# Patient Record
Sex: Female | Born: 1985 | Race: White | Marital: Single | Smoking: Never smoker
Health system: Southern US, Community
[De-identification: ages and names within clinical notes are randomized; demographics above are authoritative.]

## PROBLEM LIST (undated history)

## (undated) DIAGNOSIS — E039 Hypothyroidism, unspecified: Secondary | ICD-10-CM

## (undated) DIAGNOSIS — Z7689 Persons encountering health services in other specified circumstances: Secondary | ICD-10-CM

## (undated) DIAGNOSIS — E669 Obesity, unspecified: Secondary | ICD-10-CM

## (undated) DIAGNOSIS — N390 Urinary tract infection, site not specified: Secondary | ICD-10-CM

## (undated) DIAGNOSIS — G43909 Migraine, unspecified, not intractable, without status migrainosus: Secondary | ICD-10-CM

## (undated) HISTORY — DX: Obesity, unspecified: E66.9

## (undated) HISTORY — DX: Persons encountering health services in other specified circumstances: Z76.89

## (undated) HISTORY — DX: Migraine, unspecified, not intractable, without status migrainosus: G43.909

---
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
## 2011-10-22 ENCOUNTER — Other Ambulatory Visit (HOSPITAL_COMMUNITY)
Admission: RE | Admit: 2011-10-22 | Discharge: 2011-10-22 | Disposition: A | Discharge status: Home / Self Care | Payer: Medicaid Other | Source: Ambulatory Visit | Attending: Obstetrics & Gynecology | Admitting: Obstetrics & Gynecology

## 2011-10-22 DIAGNOSIS — Z113 Encounter for screening for infections with a predominantly sexual mode of transmission: Secondary | ICD-10-CM | POA: Insufficient documentation

## 2011-10-22 DIAGNOSIS — Z01419 Encounter for gynecological examination (general) (routine) without abnormal findings: Secondary | ICD-10-CM | POA: Insufficient documentation

## 2012-11-02 ENCOUNTER — Other Ambulatory Visit: Payer: Self-pay | Admitting: Adult Health

## 2012-11-19 ENCOUNTER — Other Ambulatory Visit: Payer: Self-pay | Admitting: Obstetrics & Gynecology

## 2012-11-20 ENCOUNTER — Other Ambulatory Visit: Payer: Self-pay | Admitting: *Deleted

## 2012-11-20 MED ORDER — NORGESTIMATE-ETH ESTRADIOL 0.25-35 MG-MCG PO TABS: 1.0000 | ORAL_TABLET | Freq: Every day | ORAL | Status: DC

## 2012-12-07 ENCOUNTER — Other Ambulatory Visit: Payer: Self-pay | Admitting: Adult Health

## 2012-12-21 ENCOUNTER — Other Ambulatory Visit: Payer: Self-pay | Admitting: Adult Health

## 2012-12-25 ENCOUNTER — Encounter: Payer: Self-pay | Admitting: *Deleted

## 2012-12-28 ENCOUNTER — Other Ambulatory Visit (HOSPITAL_COMMUNITY)
Admission: RE | Admit: 2012-12-28 | Discharge: 2012-12-28 | Disposition: A | Discharge status: Home / Self Care | Payer: Medicaid Other | Source: Ambulatory Visit | Attending: Adult Health | Admitting: Adult Health

## 2012-12-28 ENCOUNTER — Ambulatory Visit (INDEPENDENT_AMBULATORY_CARE_PROVIDER_SITE_OTHER): Payer: Medicaid Other | Admitting: Adult Health

## 2012-12-28 ENCOUNTER — Encounter: Payer: Self-pay | Admitting: Adult Health

## 2012-12-28 VITALS — BP 120/80 | HR 78 | Ht 65.0 in | Wt 217.0 lb

## 2012-12-28 DIAGNOSIS — Z113 Encounter for screening for infections with a predominantly sexual mode of transmission: Secondary | ICD-10-CM | POA: Insufficient documentation

## 2012-12-28 DIAGNOSIS — Z01419 Encounter for gynecological examination (general) (routine) without abnormal findings: Secondary | ICD-10-CM | POA: Insufficient documentation

## 2012-12-28 DIAGNOSIS — Z3049 Encounter for surveillance of other contraceptives: Secondary | ICD-10-CM

## 2012-12-28 LAB — RPR

## 2012-12-28 MED ORDER — NORGESTIMATE-ETH ESTRADIOL 0.25-35 MG-MCG PO TABS: 1.0000 | ORAL_TABLET | Freq: Every day | ORAL | Status: DC

## 2012-12-28 NOTE — Progress Notes (Signed)
Patient ID: Debra Daniel, female   DOB: 1985-09-23, 27 y.o.   MRN: [id number] History of Present Illness:  Debra Daniel is a 27 year old white female in for pap and physical.  Current Medications, Allergies, Past Medical History, Past Surgical History, Family History and Social History were reviewed in [name] record.     Review of Systems: Patient denies any  blurred vision, shortness of breath, chest pain, abdominal pain, problems with bowel movements, urination. Has occasional headache before menses, has never had sex. No mood swings, no joint pain.   Physical Exam:Blood pressure 120/80, pulse 78, height 5\' 5"  (1.651 m), weight 217 lb (98.431 kg), last menstrual period 12/16/2012. General:  Well developed, well nourished, no acute distress Skin:  Warm and dry Neck:  Midline trachea, normal thyroid Lungs; Clear to auscultation bilaterally Breast:  No dominant palpable mass, retraction, or nipple discharge Cardiovascular: Regular rate and rhythm Abdomen:  Soft, non tender, no hepatosplenomegaly Pelvic:  External genitalia is normal in appearance.  The vagina is normal in appearance. The cervix is nullipareous. Pap performed with GC/CHL  Uterus is felt to be normal size, shape, and contour.  No  adnexal masses or tenderness noted. Extremities:  No swelling or varicosities noted Psych:  Alert and cooperative, seems happy   Impression: Family planning medicaid yearly exam Period management  STD screening  Plan: Check HIV, RPR, hept B&C and HSV 2. Physical in 1 year Continue Sprintec

## 2012-12-28 NOTE — Patient Instructions (Addendum)
Sign up for my chart Physical in 1 year Call about labs

## 2012-12-29 LAB — HSV 2 ANTIBODY, IGG: HSV 2 Glycoprotein G Ab, IgG: <0.1 IV

## 2014-01-04 ENCOUNTER — Encounter: Payer: Self-pay | Admitting: Adult Health

## 2014-01-04 ENCOUNTER — Other Ambulatory Visit (HOSPITAL_COMMUNITY)
Admission: RE | Admit: 2014-01-04 | Discharge: 2014-01-04 | Disposition: A | Discharge status: Home / Self Care | Payer: Medicaid Other | Source: Ambulatory Visit | Attending: Adult Health | Admitting: Adult Health

## 2014-01-04 ENCOUNTER — Ambulatory Visit (INDEPENDENT_AMBULATORY_CARE_PROVIDER_SITE_OTHER): Payer: Medicaid Other | Admitting: Adult Health

## 2014-01-04 VITALS — BP 122/74 | HR 78 | Ht 64.0 in | Wt 219.0 lb

## 2014-01-04 DIAGNOSIS — Z7689 Persons encountering health services in other specified circumstances: Secondary | ICD-10-CM

## 2014-01-04 DIAGNOSIS — Z113 Encounter for screening for infections with a predominantly sexual mode of transmission: Secondary | ICD-10-CM | POA: Insufficient documentation

## 2014-01-04 DIAGNOSIS — Z01419 Encounter for gynecological examination (general) (routine) without abnormal findings: Secondary | ICD-10-CM | POA: Insufficient documentation

## 2014-01-04 DIAGNOSIS — Z3049 Encounter for surveillance of other contraceptives: Secondary | ICD-10-CM

## 2014-01-04 MED ORDER — LEVONORGEST-ETH ESTRAD 91-DAY 0.15-0.03 &0.01 MG PO TABS: 1.0000 | ORAL_TABLET | Freq: Every day | ORAL | Status: DC

## 2014-01-04 NOTE — Progress Notes (Signed)
Patient ID: Debra Daniel, female   DOB: Dec 15, 1985, 28 y.o.   MRN: [id number] History of Present Illness: Debra Daniel is a 28 year old white female in for a pap and physical.  Current Medications, Allergies, Past Medical History, Past Surgical History, Family History and Social History were reviewed in [employer] [facility] medical record.     Review of Systems: Patient denies any blurred vision, shortness of breath, chest pain, abdominal pain, problems with bowel movements, urination.She is not sexually active, no joint pain or mood swings, she has had some migraines associated with her periods and would like too change OCs to seasonique.    Physical Exam:BP 122/74  Pulse 78  Ht 5\' 4"  (1.626 m)  Wt 219 lb (99.338 kg)  BMI 37.57 kg/m2  LMP 12/19/2013 General:  Well developed, well nourished, no acute distress Skin:  Warm and dry Neck:  Midline trachea, normal thyroid Lungs; Clear to auscultation bilaterally Breast:  No dominant palpable mass, retraction, or nipple discharge Cardiovascular: Regular rate and rhythm Abdomen:  Soft, non tender, no hepatosplenomegaly Pelvic:  External genitalia is normal in appearance.  The vagina is normal in appearance.   The cervix is smooth, pap with GC/CHL performed.  Uterus is felt to be normal size, shape, and contour.  No                adnexal masses or tenderness noted. Extremities:  No swelling or varicosities noted, has bruise left knee fell in kitchen. Psych:  No mood changes, alert and cooperative, seems happy   Impression: Yearly gyn exam family planning medicaid Period management STD screening    Plan: Rx seasonique 1 daily with 4 refills, finish current pill pack then start these Follow up in 1 year for pap and physical Check HIV,RPR,HSV 2

## 2014-01-04 NOTE — Patient Instructions (Signed)
Finish current pills then start seasonique Return in 1 year for pap

## 2014-01-05 LAB — HSV 2 ANTIBODY, IGG: HSV 2 Glycoprotein G Ab, IgG: <0.1 IV

## 2014-01-05 LAB — SYPHILIS: RPR W/REFLEX TO RPR TITER AND TREPONEMAL ANTIBODIES, TRADITIONAL SCREENING AND DIAGNOSIS ALGORITHM

## 2014-01-05 LAB — HIV ANTIBODY (ROUTINE TESTING W REFLEX): HIV 1&2 Ab, 4th Generation: NONREACTIVE

## 2014-12-07 ENCOUNTER — Encounter: Payer: Self-pay | Admitting: Adult Health

## 2015-01-16 ENCOUNTER — Other Ambulatory Visit: Payer: Medicaid Other | Admitting: Adult Health

## 2015-01-24 ENCOUNTER — Encounter: Payer: Self-pay | Admitting: Adult Health

## 2015-01-24 ENCOUNTER — Ambulatory Visit (INDEPENDENT_AMBULATORY_CARE_PROVIDER_SITE_OTHER): Payer: Medicaid Other | Admitting: Adult Health

## 2015-01-24 ENCOUNTER — Other Ambulatory Visit (HOSPITAL_COMMUNITY)
Admission: RE | Admit: 2015-01-24 | Discharge: 2015-01-24 | Disposition: A | Discharge status: Home / Self Care | Payer: Medicaid Other | Source: Ambulatory Visit | Attending: Adult Health | Admitting: Adult Health

## 2015-01-24 VITALS — BP 100/80 | HR 88 | Ht 64.5 in | Wt 215.0 lb

## 2015-01-24 DIAGNOSIS — Z308 Encounter for other contraceptive management: Secondary | ICD-10-CM

## 2015-01-24 DIAGNOSIS — Z01419 Encounter for gynecological examination (general) (routine) without abnormal findings: Secondary | ICD-10-CM | POA: Insufficient documentation

## 2015-01-24 DIAGNOSIS — Z113 Encounter for screening for infections with a predominantly sexual mode of transmission: Secondary | ICD-10-CM | POA: Diagnosis present

## 2015-01-24 DIAGNOSIS — Z7689 Persons encountering health services in other specified circumstances: Secondary | ICD-10-CM

## 2015-01-24 DIAGNOSIS — Z309 Encounter for contraceptive management, unspecified: Secondary | ICD-10-CM

## 2015-01-24 HISTORY — DX: Persons encountering health services in other specified circumstances: Z76.89

## 2015-01-24 MED ORDER — LEVONORGEST-ETH ESTRAD 91-DAY 0.15-0.03 &0.01 MG PO TABS: 1.0000 | ORAL_TABLET | Freq: Every day | ORAL | Status: DC

## 2015-01-24 NOTE — Patient Instructions (Signed)
Physical in 1 year Continue OCs  

## 2015-01-24 NOTE — Progress Notes (Signed)
Patient ID: Timberlyn Shippee, female   DOB: [birth year], 29 y.o.   MRN: [id number] History of Present Illness:  Ashonta is a 29 year old white female, single in for well woman gyn exam, she has family planning medicaid.  Current Medications, Allergies, Past Medical History, Past Surgical History, Family History and Social History were reviewed in [name] record.     Review of Systems: Patient denies any headaches, hearing loss, fatigue, blurred vision, shortness of breath, chest pain, abdominal pain, problems with bowel movements, urination, or intercourse(has not had sex). No joint pain or mood swings.Had bee sting right breast with rash and itching is better now. She is happy with her pills and period.   Physical Exam:BP 100/80 mmHg  Pulse 88  Ht 5' 4.5" (1.638 m)  Wt 215 lb (97.523 kg)  BMI 36.35 kg/m2  LMP 01/10/2015 General:  Well developed, well nourished, no acute distress Skin:  Warm and dry Neck:  Midline trachea, normal thyroid, good ROM, no lymphadenopathy Lungs; Clear to auscultation bilaterally Breast:  No dominant palpable mass, retraction, or nipple discharge, has scab right breast where bee sting was. Cardiovascular: Regular rate and rhythm Abdomen:  Soft, non tender, no hepatosplenomegaly Pelvic:  External genitalia is normal in appearance, no lesions.  The vagina is normal in appearance. Urethra has no lesions or masses. The cervix is tiny and smooth, pap with GC.CHL performed.  Uterus is felt to be normal size, shape, and contour.  No adnexal masses or tenderness noted.Bladder is non tender, no masses felt. Extremities/musculoskeletal:  No swelling or varicosities noted, no clubbing or cyanosis Psych:  No mood changes, alert and cooperative,seems happy   Impression: Well woman gyn exam with pap and GC/CHL Periods management    Plan: Check HIV,RPR and HSV 2 Physical in 1 year Refilled camrese x 1 year

## 2015-01-25 LAB — RPR: RPR: NONREACTIVE

## 2015-01-25 LAB — HIV ANTIBODY (ROUTINE TESTING W REFLEX): HIV Screen 4th Generation wRfx: NONREACTIVE

## 2015-01-25 LAB — HSV 2 ANTIBODY, IGG

## 2015-01-26 LAB — CYTOLOGY - PAP

## 2016-04-02 ENCOUNTER — Encounter: Payer: Self-pay | Admitting: Adult Health

## 2016-04-02 ENCOUNTER — Ambulatory Visit (INDEPENDENT_AMBULATORY_CARE_PROVIDER_SITE_OTHER): Payer: Medicaid Other | Admitting: Adult Health

## 2016-04-02 VITALS — BP 130/74 | HR 82 | Ht 64.5 in | Wt 225.0 lb

## 2016-04-02 DIAGNOSIS — Z308 Encounter for other contraceptive management: Secondary | ICD-10-CM | POA: Diagnosis not present

## 2016-04-02 DIAGNOSIS — Z7689 Persons encountering health services in other specified circumstances: Secondary | ICD-10-CM

## 2016-04-02 DIAGNOSIS — Z3009 Encounter for other general counseling and advice on contraception: Secondary | ICD-10-CM

## 2016-04-02 DIAGNOSIS — Z01419 Encounter for gynecological examination (general) (routine) without abnormal findings: Secondary | ICD-10-CM

## 2016-04-02 MED ORDER — LEVONORGEST-ETH ESTRAD 91-DAY 0.15-0.03 &0.01 MG PO TABS: 1.0000 | ORAL_TABLET | Freq: Every day | ORAL | 4 refills | Status: DC

## 2016-04-02 NOTE — Progress Notes (Signed)
Patient ID: Debra Daniel, female   DOB: [birth year], 30 y.o.   MRN: [id number] History of Present Illness:  Debra Daniel is a 30 year old white female, single in for well woman gyn exam,had normal pap 01/24/15, she has family planning medicaid.  Current Medications, Allergies, Past Medical History, Past Surgical History, Family History and Social History were reviewed in [name] record.     Review of Systems: Patient denies any headaches, hearing loss, fatigue, blurred vision, shortness of breath, chest pain, abdominal pain, problems with bowel movements, urination, or intercourse(not having sex). No joint pain or mood swings.Periods good on the pills.    Physical Exam: General:  Well developed, well nourished, no acute distress Skin:  Warm and dry Neck:  Midline trachea, normal thyroid, good ROM, no lymphadenopathy Lungs; Clear to auscultation bilaterally Breast:  No dominant palpable mass, retraction, or nipple discharge Cardiovascular: Regular rate and rhythm Abdomen:  Soft, non tender, no hepatosplenomegaly Pelvic:  External genitalia is normal in appearance, no lesions.  The vagina is normal in appearance. Urethra has no lesions or masses. The cervix is nullipareous.  Uterus is felt to be normal size, shape, and contour.  No adnexal masses or tenderness noted.Bladder is non tender, no masses felt. GC/CHL obtained. Extremities/musculoskeletal:  No swelling or varicosities noted, no clubbing or cyanosis Psych:  No mood changes, alert and cooperative,seems happy   Impression: Well woman exam with routine gynecological exam  Encounter for menstrual regulation  Family planning - Plan: GC/Chlamydia Probe Amp, HIV antibody, HSV 2 antibody, IgG, RPR    Plan: Check HIV,RPR and HSV 2 GC/CHL sent Refilled camrese disp 1 package, take 1 daily  with 4 refills Physical in 1 year, pap 2019

## 2016-04-02 NOTE — Patient Instructions (Signed)
Physical in 1 year, pap 2019 Continue OCs

## 2016-04-03 LAB — HIV ANTIBODY (ROUTINE TESTING W REFLEX): HIV Screen 4th Generation wRfx: NONREACTIVE

## 2016-04-03 LAB — HSV 2 ANTIBODY, IGG: HSV 2 Glycoprotein G Ab, IgG: <0.91 index (ref 0.00–0.90)

## 2016-04-03 LAB — RPR: RPR Ser Ql: NONREACTIVE

## 2016-04-04 LAB — GC/CHLAMYDIA PROBE AMP
Chlamydia trachomatis, NAA: NEGATIVE
Neisseria gonorrhoeae by PCR: NEGATIVE

## 2017-04-04 ENCOUNTER — Other Ambulatory Visit (HOSPITAL_COMMUNITY)
Admission: RE | Admit: 2017-04-04 | Discharge: 2017-04-04 | Disposition: A | Discharge status: Home / Self Care | Payer: Medicaid Other | Source: Ambulatory Visit | Attending: Adult Health | Admitting: Adult Health

## 2017-04-04 ENCOUNTER — Encounter: Payer: Self-pay | Admitting: Adult Health

## 2017-04-04 ENCOUNTER — Ambulatory Visit (INDEPENDENT_AMBULATORY_CARE_PROVIDER_SITE_OTHER): Payer: Medicaid Other | Admitting: Adult Health

## 2017-04-04 VITALS — BP 120/80 | HR 80 | Ht 64.5 in | Wt 217.0 lb

## 2017-04-04 DIAGNOSIS — Z7689 Persons encountering health services in other specified circumstances: Secondary | ICD-10-CM

## 2017-04-04 DIAGNOSIS — Z3009 Encounter for other general counseling and advice on contraception: Secondary | ICD-10-CM | POA: Diagnosis not present

## 2017-04-04 DIAGNOSIS — Z309 Encounter for contraceptive management, unspecified: Secondary | ICD-10-CM

## 2017-04-04 DIAGNOSIS — Z01419 Encounter for gynecological examination (general) (routine) without abnormal findings: Secondary | ICD-10-CM

## 2017-04-04 MED ORDER — LEVONORGEST-ETH ESTRAD 91-DAY 0.15-0.03 &0.01 MG PO TABS: 1.0000 | ORAL_TABLET | Freq: Every day | ORAL | 4 refills | Status: DC

## 2017-04-04 NOTE — Progress Notes (Signed)
Patient ID: Debra Daniel, female   DOB: 1986/02/10, 31 y.o.   MRN: [id number] History of Present Illness:  Debra Daniel is a 31 year old white female in for well woman gyn exam and pap, she has family planning medicaid.   Current Medications, Allergies, Past Medical History, Past Surgical History, Family History and Social History were reviewed in [name] record.     Review of Systems: Patient denies any headaches, hearing loss, fatigue, blurred vision, shortness of breath, chest pain, abdominal pain, problems with bowel movements, urination, or intercourse(never had sex) . No joint pain or mood swings. She is happy with her periods on OCs.    Physical Exam:BP 120/80 (BP Location: Left Arm, Patient Position: Sitting, Cuff Size: Large)   Pulse 80   Ht 5' 4.5" (1.638 m)   Wt 217 lb (98.4 kg)   LMP 01/10/2017 (Approximate) Comment: has a period every 3 months  BMI 36.67 kg/m  General:  Well developed, well nourished, no acute distress Skin:  Warm and dry Neck:  Midline trachea, normal thyroid, good ROM, no lymphadenopathy Lungs; Clear to auscultation bilaterally Breast:  No dominant palpable mass, retraction, or nipple discharge Cardiovascular: Regular rate and rhythm Abdomen:  Soft, non tender, no hepatosplenomegaly Pelvic:  External genitalia is normal in appearance, no lesions.  The vagina is normal in appearance, spotting some.Marland Kitchen Urethra has no lesions or masses. The cervix is smooth, poorly seen, pap with GC/CHL performed.  Uterus is felt to be normal size, shape, and contour.  No adnexal masses or tenderness noted.Bladder is non tender, no masses felt. Extremities/musculoskeletal:  No swelling or varicosities noted, no clubbing or cyanosis Psych:  No mood changes, alert and cooperative,seems happy PHQ 2 score 0.  Impression: 1. Encounter for routine gynecological examination with Papanicolaou smear of cervix   2. Family planning   3. Encounter for menstrual  regulation       Plan: Meds ordered this encounter  Medications  . Levonorgestrel-Ethinyl Estradiol (AMETHIA,CAMRESE) 0.15-0.03 &0.01 MG tablet    Sig: Take 1 tablet by mouth daily.    Dispense:  1 Package    Refill:  4    Order Specific Question:   Supervising Provider    Answer:   Lazaro Arms [[id number]]  Physical in 1 year, pap in 2-3 if normal  Check HIV and RPR

## 2017-04-04 NOTE — Patient Instructions (Signed)
Physical in  1 year Pap in 2-3 if normal

## 2017-04-05 LAB — HIV ANTIBODY (ROUTINE TESTING W REFLEX): HIV Screen 4th Generation wRfx: NONREACTIVE

## 2017-04-05 LAB — RPR: RPR Ser Ql: NONREACTIVE

## 2017-04-09 LAB — CYTOLOGY - PAP
Chlamydia: NEGATIVE
Diagnosis: NEGATIVE
HPV: NOT DETECTED
NEISSERIA GONORRHEA: NEGATIVE

## 2018-04-08 ENCOUNTER — Other Ambulatory Visit: Payer: Self-pay | Admitting: Adult Health

## 2018-04-09 ENCOUNTER — Ambulatory Visit (INDEPENDENT_AMBULATORY_CARE_PROVIDER_SITE_OTHER): Payer: Medicaid Other | Admitting: Adult Health

## 2018-04-09 ENCOUNTER — Encounter: Payer: Self-pay | Admitting: Adult Health

## 2018-04-09 VITALS — BP 114/83 | HR 86 | Ht 64.5 in | Wt 226.0 lb

## 2018-04-09 DIAGNOSIS — Z7689 Persons encountering health services in other specified circumstances: Secondary | ICD-10-CM

## 2018-04-09 DIAGNOSIS — Z309 Encounter for contraceptive management, unspecified: Secondary | ICD-10-CM

## 2018-04-09 DIAGNOSIS — Z01419 Encounter for gynecological examination (general) (routine) without abnormal findings: Secondary | ICD-10-CM

## 2018-04-09 DIAGNOSIS — Z113 Encounter for screening for infections with a predominantly sexual mode of transmission: Secondary | ICD-10-CM

## 2018-04-09 DIAGNOSIS — Z3009 Encounter for other general counseling and advice on contraception: Secondary | ICD-10-CM

## 2018-04-09 MED ORDER — LEVONORGEST-ETH ESTRAD 91-DAY 0.15-0.03 &0.01 MG PO TABS: 1.0000 | ORAL_TABLET | Freq: Every day | ORAL | 4 refills | Status: DC

## 2018-04-09 NOTE — Progress Notes (Signed)
Patient ID: Debra Daniel, female   DOB: 1986-08-08, 32 y.o.   MRN: [id number] History of Present Illness:  Debra Daniel is a 32 year old white female in for well woman gyn exam and get refill on OCs, to manage periods.Last pap was 04/04/17 an was negative for malignancy and HPV. No PCP.  Current Medications, Allergies, Past Medical History, Past Surgical History, Family History and Social History were reviewed in [name] record.     Review of Systems:  Patient denies any headaches, hearing loss, fatigue, blurred vision, shortness of breath, chest pain, abdominal pain, problems with bowel movements, urination, or intercourse(never had sex) No joint pain or mood swings. Periods good in OCs.   Physical Exam:BP 114/83 (BP Location: Left Arm, Patient Position: Sitting, Cuff Size: Normal)   Pulse 86   Ht 5' 4.5" (1.638 m)   Wt 226 lb (102.5 kg)   LMP 02/07/2018   BMI 38.19 kg/m  General:  Well developed, well nourished, no acute distress Skin:  Warm and dry Neck:  Midline trachea, normal thyroid, good ROM, no lymphadenopathy Lungs; Clear to auscultation bilaterally Breast:  No dominant palpable mass, retraction, or nipple discharge Cardiovascular: Regular rate and rhythm Abdomen:  Soft, non tender, no hepatosplenomegaly Pelvic:  External genitalia is normal in appearance, no lesions.  The vagina is normal in appearance,but did not use speculum for this exam, hymen is intact. Urethra has no lesions or masses.GC/CHL obtained. Uterus is felt to be normal size, shape, and contour.  No adnexal masses or tenderness noted.Bladder is non tender, no masses felt. Extremities/musculoskeletal:  No swelling or varicosities noted, no clubbing or cyanosis Psych:  No mood changes, alert and cooperative,seems happy PHQ 2 score 0. Examination chaperoned by Marchelle Folks Rash LPN.  Impression: 1. Encounter for well woman exam with routine gynecological exam   2. Family planning   3.  Encounter for menstrual regulation   4. Screening examination for STD (sexually transmitted disease)       Plan: GC/CHL sent Check HIV and RPR Physical in 1 year Pap in 2021

## 2018-04-10 LAB — RPR: RPR: NONREACTIVE

## 2018-04-10 LAB — HIV ANTIBODY (ROUTINE TESTING W REFLEX): HIV Screen 4th Generation wRfx: NONREACTIVE

## 2018-04-12 LAB — GC/CHLAMYDIA PROBE AMP
CHLAMYDIA, DNA PROBE: NEGATIVE
NEISSERIA GONORRHOEAE BY PCR: NEGATIVE

## 2019-04-12 ENCOUNTER — Other Ambulatory Visit: Payer: Self-pay | Admitting: Women's Health

## 2019-04-12 MED ORDER — LEVONORGEST-ETH ESTRAD 91-DAY 0.15-0.03 &0.01 MG PO TABS: 1.0000 | ORAL_TABLET | Freq: Every day | ORAL | 0 refills | Status: DC

## 2019-05-31 ENCOUNTER — Other Ambulatory Visit: Payer: Self-pay

## 2019-05-31 ENCOUNTER — Ambulatory Visit (INDEPENDENT_AMBULATORY_CARE_PROVIDER_SITE_OTHER): Payer: Medicaid Other | Admitting: Adult Health

## 2019-05-31 ENCOUNTER — Other Ambulatory Visit (HOSPITAL_COMMUNITY)
Admission: RE | Admit: 2019-05-31 | Discharge: 2019-05-31 | Disposition: A | Discharge status: Home / Self Care | Payer: Medicaid Other | Source: Ambulatory Visit | Attending: Adult Health | Admitting: Adult Health

## 2019-05-31 ENCOUNTER — Encounter: Payer: Self-pay | Admitting: Adult Health

## 2019-05-31 VITALS — BP 128/80 | HR 84 | Ht 64.0 in | Wt 233.0 lb

## 2019-05-31 DIAGNOSIS — Z113 Encounter for screening for infections with a predominantly sexual mode of transmission: Secondary | ICD-10-CM

## 2019-05-31 DIAGNOSIS — Z3009 Encounter for other general counseling and advice on contraception: Secondary | ICD-10-CM | POA: Diagnosis not present

## 2019-05-31 DIAGNOSIS — Z0000 Encounter for general adult medical examination without abnormal findings: Secondary | ICD-10-CM

## 2019-05-31 DIAGNOSIS — Z01419 Encounter for gynecological examination (general) (routine) without abnormal findings: Secondary | ICD-10-CM

## 2019-05-31 DIAGNOSIS — Z7689 Persons encountering health services in other specified circumstances: Secondary | ICD-10-CM

## 2019-05-31 MED ORDER — LEVONORGEST-ETH ESTRAD 91-DAY 0.15-0.03 &0.01 MG PO TABS: 1.0000 | ORAL_TABLET | Freq: Every day | ORAL | 4 refills | Status: DC

## 2019-05-31 NOTE — Progress Notes (Signed)
Patient ID: Debra Daniel, female   DOB: August 02, 1986, 33 y.o.   MRN: [id number] History of Present Illness:  Debra Daniel is a 33 year old white female, single, G0P0, in for a well woman gyn exam,she had normal pap in 2018. She is happy with her OCs.  Current Medications, Allergies, Past Medical History, Past Surgical History, Family History and Social History were reviewed in [employer] record.     Review of Systems: Patient denies any headaches, hearing loss, fatigue, blurred vision, shortness of breath, chest pain, abdominal pain, problems with bowel movements, urination, or intercourse(not active). No joint pain or mood swings.    Physical Exam:BP 128/80 (BP Location: Right Arm, Patient Position: Sitting, Cuff Size: Normal)   Pulse 84   Ht 5\' 4"  (1.626 m)   Wt 233 lb (105.7 kg)   LMP 05/02/2019   BMI 39.99 kg/m  General:  Well developed, well nourished, no acute distress Skin:  Warm and dry Neck:  Midline trachea, normal thyroid, good ROM, no lymphadenopathy Lungs; Clear to auscultation bilaterally Breast:  No dominant palpable mass, retraction, or nipple discharge Cardiovascular: Regular rate and rhythm Abdomen:  Soft, non tender, no hepatosplenomegaly Pelvic:  External genitalia is normal in appearance, no lesions.  The vagina is normal in appearance. Urethra has no lesions or masses. The cervix is nulliparous, CV swab obtained.  Uterus is felt to be normal size, shape, and contour.  No adnexal masses or tenderness noted.Bladder is non tender, no masses felt. Extremities/musculoskeletal:  No swelling or varicosities noted, no clubbing or cyanosis Psych:  No mood changes, alert and cooperative,seems happy Fall risk is low PHQ 2 score is 0. Examination chaperoned by Duwayne Heck LPN.  Impression and Plan: 1. Family planning  2. Encounter for well woman exam with routine gynecological exam -pap and physical in 1 year   3. Encounter for menstrual  regulation -will continue OCs Meds ordered this encounter  Medications  . Levonorgestrel-Ethinyl Estradiol (AMETHIA) 0.15-0.03 &0.01 MG tablet    Sig: Take 1 tablet by mouth daily.    Dispense:  3 Package    Refill:  4    Order Specific Question:   Supervising Provider    Answer:   EURE, LUTHER H [[id number]]    4. Screening examination for STD (sexually transmitted disease) -CV swab sent for GC?CHL and trich -Check HIV and RPR

## 2019-06-01 LAB — RPR: RPR Ser Ql: NONREACTIVE

## 2019-06-01 LAB — HIV ANTIBODY (ROUTINE TESTING W REFLEX): HIV Screen 4th Generation wRfx: NONREACTIVE

## 2019-06-03 LAB — CERVICOVAGINAL ANCILLARY ONLY
Chlamydia: NEGATIVE
Comment: NEGATIVE
Comment: NEGATIVE
Comment: NORMAL
Neisseria Gonorrhea: NEGATIVE
Trichomonas: NEGATIVE

## 2020-06-07 ENCOUNTER — Other Ambulatory Visit: Payer: Medicaid Other | Admitting: Adult Health

## 2020-07-10 ENCOUNTER — Other Ambulatory Visit: Payer: Self-pay | Admitting: Women's Health

## 2020-07-17 ENCOUNTER — Other Ambulatory Visit: Payer: Medicaid Other | Admitting: Adult Health

## 2020-07-25 ENCOUNTER — Other Ambulatory Visit (HOSPITAL_COMMUNITY)
Admission: RE | Admit: 2020-07-25 | Discharge: 2020-07-25 | Disposition: A | Discharge status: Home / Self Care | Payer: Medicaid Other | Source: Ambulatory Visit | Attending: Adult Health | Admitting: Adult Health

## 2020-07-25 ENCOUNTER — Other Ambulatory Visit: Payer: Self-pay

## 2020-07-25 ENCOUNTER — Ambulatory Visit (INDEPENDENT_AMBULATORY_CARE_PROVIDER_SITE_OTHER): Payer: Medicaid Other | Admitting: Adult Health

## 2020-07-25 ENCOUNTER — Encounter: Payer: Self-pay | Admitting: Adult Health

## 2020-07-25 VITALS — BP 133/89 | HR 114 | Ht 64.0 in | Wt 225.0 lb

## 2020-07-25 DIAGNOSIS — Z113 Encounter for screening for infections with a predominantly sexual mode of transmission: Secondary | ICD-10-CM

## 2020-07-25 DIAGNOSIS — Z01419 Encounter for gynecological examination (general) (routine) without abnormal findings: Secondary | ICD-10-CM | POA: Diagnosis not present

## 2020-07-25 DIAGNOSIS — Z3009 Encounter for other general counseling and advice on contraception: Secondary | ICD-10-CM

## 2020-07-25 DIAGNOSIS — Z7689 Persons encountering health services in other specified circumstances: Secondary | ICD-10-CM | POA: Diagnosis not present

## 2020-07-25 MED ORDER — LEVONORGEST-ETH ESTRAD 91-DAY 0.15-0.03 &0.01 MG PO TABS: 1.0000 | ORAL_TABLET | Freq: Every day | ORAL | 4 refills | Status: DC

## 2020-07-25 NOTE — Progress Notes (Signed)
Patient ID: Debra Daniel, female   DOB: 1986/07/15, 34 y.o.   MRN: [id number] History of Present Illness: Debra Daniel is a 34 year old white female,single, G0P0 in for a well woman gyn exam,and pap. She has [employer]. She has received both COVID vaccines to get booster soon.  Current Medications, Allergies, Past Medical History, Past Surgical History, Family History and Social History were reviewed in [name] record.     Review of Systems: Patient denies any headaches, hearing loss, fatigue, blurred vision, shortness of breath, chest pain, abdominal pain, problems with bowel movements, urination, or intercourse(never had sex). No joint pain or mood swings.    Physical Exam:BP 133/89 (BP Location: Left Arm, Patient Position: Sitting, Cuff Size: Normal)   Pulse (!) 114   Ht 5\' 4"  (1.626 m)   Wt 225 lb (102.1 kg)   LMP 07/10/2020   BMI 38.62 kg/m  General:  Well developed, well nourished, no acute distress Skin:  Warm and dry Neck:  Midline trachea, normal thyroid, good ROM, no lymphadenopathy Lungs; Clear to auscultation bilaterally Breast:  No dominant palpable mass, retraction, or nipple discharge,large Cardiovascular: Regular rate and rhythm Abdomen:  Soft, non tender, no hepatosplenomegaly Pelvic:  External genitalia is normal in appearance, no lesions.  The vagina is normal in appearance. Urethra has no lesions or masses. The cervix is nulliparous,poorly seen, pap with GC/CHL and high risk HPV genotyping performed.  Uterus is felt to be normal size, shape, and contour.  No adnexal masses or tenderness noted.Bladder is non tender, no masses felt. Extremities/musculoskeletal:  No swelling or varicosities noted, no clubbing or cyanosis Psych:  No mood changes, alert and cooperative,seems happy AA is 0 Fall risk is low PHQ 9 score is 0  [facility] - 07/25/20 1336      Pregnancy Intention Screening   Does the patient want to become pregnant in the  next year? No    Does the patient's partner want to become pregnant in the next year? N/A    Would the patient like to discuss contraceptive options today? N/A      Contraception Wrap Up   Current Method Oral Contraceptive   abstinence too   End Method Oral Contraceptive    Contraception Counseling Provided No         Examination chaperoned by Tish RN  Impression and Plan: 1. Encounter for gynecological examination with Papanicolaou smear of cervix Pap sent Physical in 1 year Pap in 3 if normal  2. Encounter for menstrual regulation Will refill Daysee Meds ordered this encounter  Medications  . Levonorgestrel-Ethinyl Estradiol (DAYSEE) 0.15-0.03 &0.01 MG tablet    Sig: Take 1 tablet by mouth daily.    Dispense:  91 tablet    Refill:  4    Order Specific Question:   Supervising Provider    Answer:   02-17-1997, LUTHER H [[id number]]    3. Family planning  4. Screening examination for STD (sexually transmitted disease) Check HIV and RPR

## 2020-07-26 LAB — HIV ANTIBODY (ROUTINE TESTING W REFLEX): HIV Screen 4th Generation wRfx: NONREACTIVE

## 2020-07-26 LAB — RPR: RPR Ser Ql: NONREACTIVE

## 2020-07-28 LAB — CYTOLOGY - PAP
Chlamydia: NEGATIVE
Comment: NEGATIVE
Comment: NEGATIVE
Comment: NORMAL
Diagnosis: NEGATIVE
High risk HPV: NEGATIVE
Neisseria Gonorrhea: NEGATIVE

## 2020-12-22 ENCOUNTER — Other Ambulatory Visit: Payer: Self-pay

## 2020-12-22 DIAGNOSIS — R000 Tachycardia, unspecified: Secondary | ICD-10-CM | POA: Diagnosis not present

## 2020-12-22 DIAGNOSIS — R112 Nausea with vomiting, unspecified: Secondary | ICD-10-CM | POA: Diagnosis present

## 2020-12-22 NOTE — ED Triage Notes (Signed)
Pt seen as Virtual visit last Wednesday for vomiting and prescribed Zofran. Pt states her emesis went away but then returned again today and that she cannot keep anything down. Also states that when she urinates, "very little comes out".

## 2020-12-23 ENCOUNTER — Emergency Department (HOSPITAL_COMMUNITY)
Admission: EM | Admit: 2020-12-23 | Discharge: 2020-12-23 | Disposition: A | Discharge status: Home / Self Care | Payer: Medicaid Other | Attending: Emergency Medicine | Admitting: Emergency Medicine

## 2020-12-23 DIAGNOSIS — R112 Nausea with vomiting, unspecified: Secondary | ICD-10-CM

## 2020-12-23 LAB — URINALYSIS, ROUTINE W REFLEX MICROSCOPIC
Bilirubin Urine: NEGATIVE
Glucose, UA: NEGATIVE mg/dL
Ketones, ur: NEGATIVE mg/dL
Nitrite: NEGATIVE
Protein, ur: 100 mg/dL — AB
RBC / HPF: >50 RBC/hpf — ABNORMAL HIGH (ref 0–5)
Specific Gravity, Urine: 1.027 (ref 1.005–1.030)
WBC, UA: >50 WBC/hpf — ABNORMAL HIGH (ref 0–5)
pH: 5 (ref 5.0–8.0)

## 2020-12-23 LAB — COMPREHENSIVE METABOLIC PANEL
ALT: 149 U/L — ABNORMAL HIGH (ref 0–44)
AST: 108 U/L — ABNORMAL HIGH (ref 15–41)
Albumin: 3.4 g/dL — ABNORMAL LOW (ref 3.5–5.0)
Alkaline Phosphatase: 43 U/L (ref 38–126)
Anion gap: 12 (ref 5–15)
BUN: 19 mg/dL (ref 6–20)
CO2: 23 mmol/L (ref 22–32)
Calcium: 8.6 mg/dL — ABNORMAL LOW (ref 8.9–10.3)
Chloride: 102 mmol/L (ref 98–111)
Creatinine, Ser: 1.19 mg/dL — ABNORMAL HIGH (ref 0.44–1.00)
GFR, Estimated: >60 mL/min (ref >60)
Glucose, Bld: 125 mg/dL — ABNORMAL HIGH (ref 70–99)
Potassium: 3.6 mmol/L (ref 3.5–5.1)
Sodium: 137 mmol/L (ref 135–145)
Total Bilirubin: 1.1 mg/dL (ref 0.3–1.2)
Total Protein: 6.7 g/dL (ref 6.5–8.1)

## 2020-12-23 LAB — CBC
HCT: 46.4 % — ABNORMAL HIGH (ref 36.0–46.0)
Hemoglobin: 14.8 g/dL (ref 12.0–15.0)
MCH: 29.1 pg (ref 26.0–34.0)
MCHC: 31.9 g/dL (ref 30.0–36.0)
MCV: 91.3 fL (ref 80.0–100.0)
Platelets: 308 10*3/uL (ref 150–400)
RBC: 5.08 MIL/uL (ref 3.87–5.11)
RDW: 13.1 % (ref 11.5–15.5)
WBC: 14.9 10*3/uL — ABNORMAL HIGH (ref 4.0–10.5)
nRBC: 0 % (ref 0.0–0.2)

## 2020-12-23 LAB — POC URINE PREG, ED: Preg Test, Ur: NEGATIVE

## 2020-12-23 LAB — LIPASE, BLOOD: Lipase: 50 U/L (ref 11–51)

## 2020-12-23 MED ORDER — SODIUM CHLORIDE 0.9 % IV BOLUS
1000.0000 mL | Freq: Once | INTRAVENOUS | Status: AC
  Administered 2020-12-23: 1000 mL via INTRAVENOUS

## 2020-12-23 MED ORDER — PROMETHAZINE HCL 25 MG PO TABS: 25.0000 mg | ORAL_TABLET | Freq: Four times a day (QID) | ORAL | 0 refills | Status: DC | PRN

## 2020-12-23 MED ORDER — ONDANSETRON HCL 4 MG/2ML IJ SOLN
4.0000 mg | Freq: Once | INTRAMUSCULAR | Status: AC
  Administered 2020-12-23: 4 mg via INTRAVENOUS
  Filled 2020-12-23: qty 2

## 2020-12-23 MED ORDER — PROMETHAZINE HCL 25 MG RE SUPP: 25.0000 mg | Freq: Four times a day (QID) | RECTAL | 0 refills | Status: DC | PRN

## 2020-12-23 MED ORDER — FOSFOMYCIN TROMETHAMINE 3 G PO PACK
3.0000 g | PACK | Freq: Once | ORAL | Status: AC
  Administered 2020-12-23: 3 g via ORAL
  Filled 2020-12-23: qty 3

## 2020-12-23 NOTE — ED Provider Notes (Signed)
[facility] EMERGENCY DEPARTMENT Provider Note   CSN: [id number] Arrival date & time: 12/22/20  2239     History Chief Complaint  Patient presents with  . Emesis    Debra Daniel is a 35 y.o. female.  35 yo F with a chief complaints of nausea and vomiting.  Going on for about 4 days now.  She had a virtual visit and was prescribed Zofran.  Had some transient improvement but then recurrent vomiting.  Not able to keep anything down.  Feels like her urine output is decreased.  Denies any abdominal pain.  Had episode like this back in January but was thought to be due to a virus.  The history is provided by the patient.  Emesis Severity:  Moderate Duration:  4 days Timing:  Constant Progression:  Worsening Chronicity:  Recurrent Recent urination:  Normal Relieved by:  Nothing Worsened by:  Nothing Ineffective treatments:  None tried Associated symptoms: no abdominal pain, no arthralgias, no chills, no fever, no headaches and no myalgias        Past Medical History:  Diagnosis Date  . Encounter for menstrual regulation 01/24/2015  . Migraines   . Obesity     Patient Active Problem List   Diagnosis Date Noted  . Encounter for gynecological examination with Papanicolaou smear of cervix 07/25/2020  . Screening examination for STD (sexually transmitted disease) 04/09/2018  . Encounter for well woman exam with routine gynecological exam 04/09/2018  . Family planning 04/09/2018  . Encounter for menstrual regulation 01/24/2015    Past Surgical History:  Procedure Laterality Date  . broken collar bone  1994     OB History    Gravida  0   Para      Term      Preterm      AB      Living        SAB      IAB      Ectopic      Multiple      Live Births              Family History  Problem Relation Age of Onset  . Hypertension Paternal Grandmother   . Coronary artery disease Paternal Grandmother   . Hypertension Maternal Grandmother   . Diabetes  Maternal Grandmother   . Hyperlipidemia Maternal Grandmother   . Thyroid disease Maternal Grandmother   . Kidney disease Maternal Grandmother   . Diabetes Father   . Hypertension Mother   . Hyperlipidemia Mother   . COPD Paternal Grandfather   . Heart disease Maternal Grandfather   . Diabetes Maternal Grandfather   . Hypertension Brother   . Hyperlipidemia Brother     Social History   Tobacco Use  . Smoking status: Never Smoker  . Smokeless tobacco: Never Used  Vaping Use  . Vaping Use: Never used  Substance Use Topics  . Alcohol use: No  . Drug use: No    Home Medications Prior to Admission medications   Medication Sig Start Date End Date Taking? Authorizing Provider  promethazine (PHENERGAN) 25 MG suppository Place 1 suppository (25 mg total) rectally every 6 (six) hours as needed for nausea or vomiting. 12/23/20  Yes Melene Plan, DO  promethazine (PHENERGAN) 25 MG tablet Take 1 tablet (25 mg total) by mouth every 6 (six) hours as needed for nausea or vomiting. 12/23/20  Yes Melene Plan, DO  Levonorgestrel-Ethinyl Estradiol (DAYSEE) 0.15-0.03 &0.01 MG tablet Take 1 tablet by mouth daily.  07/25/20   [name], NP    Allergies    Elemental sulfur  Review of Systems   Review of Systems  Constitutional: Negative for chills and fever.  HENT: Negative for congestion and rhinorrhea.   Eyes: Negative for redness and visual disturbance.  Respiratory: Negative for shortness of breath and wheezing.   Cardiovascular: Negative for chest pain and palpitations.  Gastrointestinal: Positive for vomiting. Negative for abdominal pain and nausea.  Genitourinary: Negative for dysuria and urgency.  Musculoskeletal: Negative for arthralgias and myalgias.  Skin: Negative for pallor and wound.  Neurological: Negative for dizziness and headaches.    Physical Exam Updated Vital Signs BP (!) 129/96   Pulse (!) 128   Temp 98.3 F (36.8 C) (Oral)   Resp 17   Ht 5' 4.5" (1.638 m)    Wt 97.2 kg   SpO2 98%   BMI 36.20 kg/m   Physical Exam Vitals and nursing note reviewed.  Constitutional:      General: She is not in acute distress.    Appearance: She is well-developed. She is not diaphoretic.  HENT:     Head: Normocephalic and atraumatic.  Eyes:     Pupils: Pupils are equal, round, and reactive to light.  Cardiovascular:     Rate and Rhythm: Normal rate and regular rhythm.     Heart sounds: No murmur heard. No friction rub. No gallop.   Pulmonary:     Effort: Pulmonary effort is normal.     Breath sounds: No wheezing or rales.  Abdominal:     General: There is no distension.     Palpations: Abdomen is soft.     Tenderness: There is no abdominal tenderness. There is no guarding.     Comments: Benign abdominal exam  Musculoskeletal:        General: No tenderness.     Cervical back: Normal range of motion and neck supple.  Skin:    General: Skin is warm and dry.  Neurological:     Mental Status: She is alert and oriented to person, place, and time.  Psychiatric:        Behavior: Behavior normal.     ED Results / Procedures / Treatments   Labs (all labs ordered are listed, but only abnormal results are displayed) Labs Reviewed  COMPREHENSIVE METABOLIC PANEL - Abnormal; Notable for the following components:      Result Value   Glucose, Bld 125 (*)    Creatinine, Ser 1.19 (*)    Calcium 8.6 (*)    Albumin 3.4 (*)    AST 108 (*)    ALT 149 (*)    All other components within normal limits  CBC - Abnormal; Notable for the following components:   WBC 14.9 (*)    HCT 46.4 (*)    All other components within normal limits  URINALYSIS, ROUTINE W REFLEX MICROSCOPIC - Abnormal; Notable for the following components:   Color, Urine AMBER (*)    APPearance CLOUDY (*)    Hgb urine dipstick LARGE (*)    Protein, ur 100 (*)    Leukocytes,Ua MODERATE (*)    RBC / HPF >50 (*)    WBC, UA >50 (*)    Bacteria, UA RARE (*)    All other components within  normal limits  LIPASE, BLOOD  POC URINE PREG, ED    EKG None  Radiology No results found.  Procedures Procedures   Medications Ordered in ED Medications  fosfomycin (MONUROL) packet  3 g (has no administration in time range)  sodium chloride 0.9 % bolus 1,000 mL (0 mLs Intravenous Stopped 12/23/20 0340)  ondansetron (ZOFRAN) injection 4 mg (4 mg Intravenous Given 12/23/20 0202)    ED Course  I have reviewed the triage vital signs and the nursing notes.  Pertinent labs & imaging results that were available during my care of the patient were reviewed by me and considered in my medical decision making (see chart for details).    MDM Rules/Calculators/A&P                          35 yo F with a chief complaints of intractable nausea and vomiting.  Going on for about 4 days now.  Tachycardic into the 130s on arrival.  We will give a bolus of IV fluids nausea medicine and lab work.  Very mild elevation of her LFTs.  Lipase is normal.   Urine likely contaminated with 6-10 squames no bacteria mild leukocyte estrace.  Will give 1 dose of fosfomycin here.  4:21 AM:  I have discussed the diagnosis/risks/treatment options with the patient and family and believe the pt to be eligible for discharge home to follow-up with PCP. We also discussed returning to the ED immediately if new or worsening sx occur. We discussed the sx which are most concerning (e.g., sudden worsening pain, fever, inability to tolerate by mouth) that necessitate immediate return. Medications administered to the patient during their visit and any new prescriptions provided to the patient are listed below.  Medications given during this visit Medications  fosfomycin (MONUROL) packet 3 g (has no administration in time range)  sodium chloride 0.9 % bolus 1,000 mL (0 mLs Intravenous Stopped 12/23/20 0340)  ondansetron (ZOFRAN) injection 4 mg (4 mg Intravenous Given 12/23/20 0202)     The patient appears reasonably screen  and/or stabilized for discharge and I doubt any other medical condition or other [facility] requiring further screening, evaluation, or treatment in the ED at this time prior to discharge.   Final Clinical Impression(s) / ED Diagnoses Final diagnoses:  Nausea and vomiting in adult    Rx / DC Orders ED Discharge Orders         Ordered    promethazine (PHENERGAN) 25 MG tablet  Every 6 hours PRN        12/23/20 0331    promethazine (PHENERGAN) 25 MG suppository  Every 6 hours PRN        12/23/20 0331           Melene Plan, DO 12/23/20 0421

## 2020-12-23 NOTE — Discharge Instructions (Signed)
Please follow-up with your family doctor.  They may want to refer you to a gastroenterologist if you continue to have episodes where you have trouble eating and drinking.

## 2020-12-23 NOTE — ED Notes (Signed)
Pt drank ginger ale and had no complaints of nausea nor did she vomit.

## 2021-01-11 ENCOUNTER — Other Ambulatory Visit: Payer: Self-pay | Admitting: Women's Health

## 2021-01-16 ENCOUNTER — Other Ambulatory Visit: Payer: Self-pay | Admitting: Adult Health

## 2021-02-06 ENCOUNTER — Inpatient Hospital Stay (HOSPITAL_COMMUNITY): Payer: Medicaid Other

## 2021-02-06 ENCOUNTER — Other Ambulatory Visit: Payer: Self-pay

## 2021-02-06 ENCOUNTER — Telehealth: Payer: Self-pay | Admitting: Family Medicine

## 2021-02-06 ENCOUNTER — Inpatient Hospital Stay (HOSPITAL_COMMUNITY)
Admission: AD | Admit: 2021-02-06 | Discharge: 2021-02-22 | DRG: 286 | Disposition: A | Discharge status: Home / Self Care | Payer: Medicaid Other | Source: Other Acute Inpatient Hospital | Attending: Family Medicine | Admitting: Family Medicine

## 2021-02-06 ENCOUNTER — Encounter (HOSPITAL_COMMUNITY): Payer: Self-pay | Admitting: Family Medicine

## 2021-02-06 ENCOUNTER — Inpatient Hospital Stay: Payer: Self-pay

## 2021-02-06 DIAGNOSIS — E873 Alkalosis: Secondary | ICD-10-CM | POA: Diagnosis not present

## 2021-02-06 DIAGNOSIS — R570 Cardiogenic shock: Secondary | ICD-10-CM | POA: Diagnosis present

## 2021-02-06 DIAGNOSIS — J9601 Acute respiratory failure with hypoxia: Secondary | ICD-10-CM | POA: Diagnosis present

## 2021-02-06 DIAGNOSIS — D689 Coagulation defect, unspecified: Secondary | ICD-10-CM | POA: Diagnosis present

## 2021-02-06 DIAGNOSIS — I4891 Unspecified atrial fibrillation: Secondary | ICD-10-CM | POA: Diagnosis present

## 2021-02-06 DIAGNOSIS — I513 Intracardiac thrombosis, not elsewhere classified: Secondary | ICD-10-CM | POA: Diagnosis present

## 2021-02-06 DIAGNOSIS — Y95 Nosocomial condition: Secondary | ICD-10-CM | POA: Diagnosis present

## 2021-02-06 DIAGNOSIS — D62 Acute posthemorrhagic anemia: Secondary | ICD-10-CM | POA: Diagnosis not present

## 2021-02-06 DIAGNOSIS — I2693 Single subsegmental pulmonary embolism without acute cor pulmonale: Secondary | ICD-10-CM | POA: Diagnosis present

## 2021-02-06 DIAGNOSIS — Z6839 Body mass index (BMI) 39.0-39.9, adult: Secondary | ICD-10-CM

## 2021-02-06 DIAGNOSIS — E119 Type 2 diabetes mellitus without complications: Secondary | ICD-10-CM

## 2021-02-06 DIAGNOSIS — E1165 Type 2 diabetes mellitus with hyperglycemia: Secondary | ICD-10-CM | POA: Diagnosis not present

## 2021-02-06 DIAGNOSIS — I272 Pulmonary hypertension, unspecified: Secondary | ICD-10-CM | POA: Diagnosis present

## 2021-02-06 DIAGNOSIS — I081 Rheumatic disorders of both mitral and tricuspid valves: Secondary | ICD-10-CM | POA: Diagnosis present

## 2021-02-06 DIAGNOSIS — E039 Hypothyroidism, unspecified: Secondary | ICD-10-CM | POA: Diagnosis present

## 2021-02-06 DIAGNOSIS — I428 Other cardiomyopathies: Secondary | ICD-10-CM

## 2021-02-06 DIAGNOSIS — I400 Infective myocarditis: Secondary | ICD-10-CM | POA: Diagnosis present

## 2021-02-06 DIAGNOSIS — J189 Pneumonia, unspecified organism: Secondary | ICD-10-CM | POA: Diagnosis present

## 2021-02-06 DIAGNOSIS — I2699 Other pulmonary embolism without acute cor pulmonale: Secondary | ICD-10-CM | POA: Diagnosis present

## 2021-02-06 DIAGNOSIS — E871 Hypo-osmolality and hyponatremia: Secondary | ICD-10-CM | POA: Diagnosis not present

## 2021-02-06 DIAGNOSIS — K761 Chronic passive congestion of liver: Secondary | ICD-10-CM | POA: Diagnosis present

## 2021-02-06 DIAGNOSIS — Z87440 Personal history of urinary (tract) infections: Secondary | ICD-10-CM

## 2021-02-06 DIAGNOSIS — E876 Hypokalemia: Secondary | ICD-10-CM | POA: Diagnosis not present

## 2021-02-06 DIAGNOSIS — E872 Acidosis: Secondary | ICD-10-CM | POA: Diagnosis present

## 2021-02-06 DIAGNOSIS — I5021 Acute systolic (congestive) heart failure: Secondary | ICD-10-CM | POA: Diagnosis present

## 2021-02-06 DIAGNOSIS — I472 Ventricular tachycardia: Secondary | ICD-10-CM | POA: Diagnosis present

## 2021-02-06 DIAGNOSIS — I462 Cardiac arrest due to underlying cardiac condition: Secondary | ICD-10-CM | POA: Diagnosis not present

## 2021-02-06 DIAGNOSIS — N390 Urinary tract infection, site not specified: Secondary | ICD-10-CM | POA: Diagnosis present

## 2021-02-06 DIAGNOSIS — E669 Obesity, unspecified: Secondary | ICD-10-CM | POA: Diagnosis present

## 2021-02-06 DIAGNOSIS — R3129 Other microscopic hematuria: Secondary | ICD-10-CM | POA: Diagnosis present

## 2021-02-06 DIAGNOSIS — I4901 Ventricular fibrillation: Secondary | ICD-10-CM | POA: Diagnosis not present

## 2021-02-06 DIAGNOSIS — Z8249 Family history of ischemic heart disease and other diseases of the circulatory system: Secondary | ICD-10-CM

## 2021-02-06 DIAGNOSIS — N179 Acute kidney failure, unspecified: Secondary | ICD-10-CM | POA: Diagnosis present

## 2021-02-06 DIAGNOSIS — J9811 Atelectasis: Secondary | ICD-10-CM

## 2021-02-06 DIAGNOSIS — J9600 Acute respiratory failure, unspecified whether with hypoxia or hypercapnia: Secondary | ICD-10-CM

## 2021-02-06 DIAGNOSIS — D7582 Heparin induced thrombocytopenia (HIT): Secondary | ICD-10-CM | POA: Diagnosis present

## 2021-02-06 DIAGNOSIS — Z20822 Contact with and (suspected) exposure to covid-19: Secondary | ICD-10-CM | POA: Diagnosis present

## 2021-02-06 DIAGNOSIS — R000 Tachycardia, unspecified: Secondary | ICD-10-CM | POA: Diagnosis not present

## 2021-02-06 DIAGNOSIS — R042 Hemoptysis: Secondary | ICD-10-CM | POA: Diagnosis not present

## 2021-02-06 DIAGNOSIS — R609 Edema, unspecified: Secondary | ICD-10-CM | POA: Diagnosis not present

## 2021-02-06 DIAGNOSIS — R7989 Other specified abnormal findings of blood chemistry: Secondary | ICD-10-CM | POA: Diagnosis present

## 2021-02-06 DIAGNOSIS — I471 Supraventricular tachycardia: Secondary | ICD-10-CM | POA: Diagnosis not present

## 2021-02-06 DIAGNOSIS — Z79890 Hormone replacement therapy (postmenopausal): Secondary | ICD-10-CM

## 2021-02-06 DIAGNOSIS — Z825 Family history of asthma and other chronic lower respiratory diseases: Secondary | ICD-10-CM

## 2021-02-06 DIAGNOSIS — Z83438 Family history of other disorder of lipoprotein metabolism and other lipidemia: Secondary | ICD-10-CM

## 2021-02-06 DIAGNOSIS — I5082 Biventricular heart failure: Secondary | ICD-10-CM | POA: Diagnosis present

## 2021-02-06 DIAGNOSIS — Z833 Family history of diabetes mellitus: Secondary | ICD-10-CM

## 2021-02-06 DIAGNOSIS — Z882 Allergy status to sulfonamides status: Secondary | ICD-10-CM

## 2021-02-06 DIAGNOSIS — I493 Ventricular premature depolarization: Secondary | ICD-10-CM | POA: Diagnosis not present

## 2021-02-06 HISTORY — DX: Urinary tract infection, site not specified: N39.0

## 2021-02-06 HISTORY — DX: Hypothyroidism, unspecified: E03.9

## 2021-02-06 LAB — ECHOCARDIOGRAM LIMITED
Height: 64.5 in
MV M vel: 4.78 m/s
MV Peak grad: 91.4 mmHg
Radius: 0.45 cm
S' Lateral: 5.4 cm
Weight: 3633.18 oz

## 2021-02-06 LAB — COMPREHENSIVE METABOLIC PANEL
ALT: 55 U/L — ABNORMAL HIGH (ref 0–44)
AST: 39 U/L (ref 15–41)
Albumin: 2.4 g/dL — ABNORMAL LOW (ref 3.5–5.0)
Alkaline Phosphatase: 61 U/L (ref 38–126)
Anion gap: 17 — ABNORMAL HIGH (ref 5–15)
BUN: 21 mg/dL — ABNORMAL HIGH (ref 6–20)
CO2: 18 mmol/L — ABNORMAL LOW (ref 22–32)
Calcium: 7.9 mg/dL — ABNORMAL LOW (ref 8.9–10.3)
Chloride: 96 mmol/L — ABNORMAL LOW (ref 98–111)
Creatinine, Ser: 1.13 mg/dL — ABNORMAL HIGH (ref 0.44–1.00)
GFR, Estimated: >60 mL/min (ref >60)
Glucose, Bld: 135 mg/dL — ABNORMAL HIGH (ref 70–99)
Potassium: 4 mmol/L (ref 3.5–5.1)
Sodium: 131 mmol/L — ABNORMAL LOW (ref 135–145)
Total Bilirubin: 3.5 mg/dL — ABNORMAL HIGH (ref 0.3–1.2)
Total Protein: 5.2 g/dL — ABNORMAL LOW (ref 6.5–8.1)

## 2021-02-06 LAB — CBC
HCT: 38.3 % (ref 36.0–46.0)
Hemoglobin: 12.6 g/dL (ref 12.0–15.0)
MCH: 26.3 pg (ref 26.0–34.0)
MCHC: 32.9 g/dL (ref 30.0–36.0)
MCV: 79.8 fL — ABNORMAL LOW (ref 80.0–100.0)
Platelets: 156 10*3/uL (ref 150–400)
RBC: 4.8 MIL/uL (ref 3.87–5.11)
RDW: 16 % — ABNORMAL HIGH (ref 11.5–15.5)
WBC: 17.5 10*3/uL — ABNORMAL HIGH (ref 4.0–10.5)
nRBC: 0 % (ref 0.0–0.2)

## 2021-02-06 LAB — RAPID URINE DRUG SCREEN, HOSP PERFORMED
Amphetamines: NOT DETECTED
Barbiturates: NOT DETECTED
Benzodiazepines: NOT DETECTED
Cocaine: NOT DETECTED
Opiates: POSITIVE — AB
Tetrahydrocannabinol: NOT DETECTED

## 2021-02-06 LAB — DIC (DISSEMINATED INTRAVASCULAR COAGULATION)PANEL
D-Dimer, Quant: 10.13 ug/mL-FEU — ABNORMAL HIGH (ref 0.00–0.50)
Fibrinogen: 388 mg/dL (ref 210–475)
INR: 1.9 — ABNORMAL HIGH (ref 0.8–1.2)
Platelets: 172 10*3/uL (ref 150–400)
Prothrombin Time: 22 seconds — ABNORMAL HIGH (ref 11.4–15.2)
aPTT: 29 seconds (ref 24–36)

## 2021-02-06 LAB — TSH: TSH: 13.59 u[IU]/mL — ABNORMAL HIGH (ref 0.350–4.500)

## 2021-02-06 LAB — GLUCOSE, CAPILLARY: Glucose-Capillary: 134 mg/dL — ABNORMAL HIGH (ref 70–99)

## 2021-02-06 LAB — STREP PNEUMONIAE URINARY ANTIGEN: Strep Pneumo Urinary Antigen: NEGATIVE

## 2021-02-06 LAB — PROTIME-INR
INR: 1.9 — ABNORMAL HIGH (ref 0.8–1.2)
Prothrombin Time: 21.5 seconds — ABNORMAL HIGH (ref 11.4–15.2)

## 2021-02-06 LAB — TROPONIN I (HIGH SENSITIVITY): Troponin I (High Sensitivity): 26 ng/L — ABNORMAL HIGH (ref <18)

## 2021-02-06 LAB — T4, FREE: Free T4: 1.39 ng/dL — ABNORMAL HIGH (ref 0.61–1.12)

## 2021-02-06 LAB — TYPE AND SCREEN
ABO/RH(D): A POS
Antibody Screen: NEGATIVE

## 2021-02-06 LAB — BRAIN NATRIURETIC PEPTIDE: B Natriuretic Peptide: 1571.8 pg/mL — ABNORMAL HIGH (ref 0.0–100.0)

## 2021-02-06 LAB — SEDIMENTATION RATE: Sed Rate: 4 mm/hr (ref 0–22)

## 2021-02-06 LAB — MAGNESIUM: Magnesium: 1.6 mg/dL — ABNORMAL LOW (ref 1.7–2.4)

## 2021-02-06 LAB — PROCALCITONIN: Procalcitonin: 2.31 ng/mL

## 2021-02-06 LAB — LACTIC ACID, PLASMA: Lactic Acid, Venous: 4.3 mmol/L (ref 0.5–1.9)

## 2021-02-06 LAB — HIV ANTIBODY (ROUTINE TESTING W REFLEX): HIV Screen 4th Generation wRfx: NONREACTIVE

## 2021-02-06 LAB — MRSA NEXT GEN BY PCR, NASAL: MRSA by PCR Next Gen: NOT DETECTED

## 2021-02-06 MED ORDER — VANCOMYCIN HCL 2000 MG/400ML IV SOLN
2000.0000 mg | INTRAVENOUS | Status: AC
  Administered 2021-02-07: 2000 mg via INTRAVENOUS
  Filled 2021-02-06: qty 400

## 2021-02-06 MED ORDER — MAGNESIUM SULFATE 2 GM/50ML IV SOLN
2.0000 g | Freq: Once | INTRAVENOUS | Status: AC
  Administered 2021-02-06: 2 g via INTRAVENOUS
  Filled 2021-02-06: qty 50

## 2021-02-06 MED ORDER — FUROSEMIDE 10 MG/ML IJ SOLN
20.0000 mg | Freq: Once | INTRAMUSCULAR | Status: AC
  Administered 2021-02-06: 20 mg via INTRAVENOUS
  Filled 2021-02-06: qty 2

## 2021-02-06 MED ORDER — SODIUM CHLORIDE 0.9 % IV SOLN
INTRAVENOUS | Status: DC | PRN
  Administered 2021-02-06 – 2021-02-08 (×3): 250 mL via INTRAVENOUS

## 2021-02-06 MED ORDER — CHLORHEXIDINE GLUCONATE CLOTH 2 % EX PADS
6.0000 | MEDICATED_PAD | Freq: Every day | CUTANEOUS | Status: DC
  Administered 2021-02-06 – 2021-02-22 (×17): 6 via TOPICAL

## 2021-02-06 MED ORDER — VANCOMYCIN HCL 1500 MG/300ML IV SOLN
1500.0000 mg | Freq: Two times a day (BID) | INTRAVENOUS | Status: DC
  Filled 2021-02-06: qty 300

## 2021-02-06 MED ORDER — SODIUM CHLORIDE 0.9 % IV SOLN
2.0000 g | Freq: Three times a day (TID) | INTRAVENOUS | Status: DC
  Administered 2021-02-07 (×2): 2 g via INTRAVENOUS
  Filled 2021-02-06 (×2): qty 2

## 2021-02-06 MED ORDER — FUROSEMIDE 10 MG/ML IJ SOLN
40.0000 mg | Freq: Once | INTRAMUSCULAR | Status: AC
  Administered 2021-02-06: 40 mg via INTRAVENOUS
  Filled 2021-02-06: qty 4

## 2021-02-06 MED ORDER — MILRINONE LACTATE IN DEXTROSE 20-5 MG/100ML-% IV SOLN
0.1250 ug/kg/min | INTRAVENOUS | Status: DC
  Administered 2021-02-07 – 2021-02-08 (×6): 0.25 ug/kg/min via INTRAVENOUS
  Administered 2021-02-09 – 2021-02-12 (×10): 0.375 ug/kg/min via INTRAVENOUS
  Administered 2021-02-13: 0.25 ug/kg/min via INTRAVENOUS
  Administered 2021-02-13: 0.375 ug/kg/min via INTRAVENOUS
  Administered 2021-02-15 (×2): 0.125 ug/kg/min via INTRAVENOUS
  Filled 2021-02-06 (×21): qty 100

## 2021-02-06 NOTE — Progress Notes (Signed)
Notified admitting that the patient has arrived to unit. Admitting staff stated she will page Dr. Adela Glimpse to alert of patient arrival. Will wait for orders/response from provider.   Patient oriented to unit and call system, verbalized understanding. Placed in bed, call light with in reach. Will continue to monitor.

## 2021-02-06 NOTE — H&P (Addendum)
NAME:  Debra Daniel, MRN:  [id number], DOB:  [birth year], LOS: 0 ADMISSION DATE:  02/06/2021, CONSULTATION DATE:  6/28 REFERRING MD:  Dr. Adela Glimpse, CHIEF COMPLAINT:  hemoptysis   History of Present Illness:  35 year old female with no significant past medical history up until quite recently. Three months prior to admission she was diagnosed with hypothyroid and was started on synthroid. Then, approximately 3 months prior to presentation she developed UTI symptoms. She is unclear what medications she was treated with, although she remembers the first one was ineffective and a second antibiotic was needed. Progressive lower extremity edema since that time as well. Then 6/25 she began to experience mild hemoptysis, which prompted her to present to [facility] 6/27. Workup in the ED included CTA chest and CT abdomen/pelvis. She was found to have right sided segmental PE as well a crazy paving pattern on the CT chest. CT abdomen was non-acute. She was admitted. Started on heparin for PE, which caused her hemoptysis to become much worse. Heparin was stopped. Echocardiogram done demonstrated LVEF 25-30% as well as mod-severe mitral regurgitation. She was transferred to [facility] for further evaluation.   Pertinent  Medical History   has a past medical history of Encounter for menstrual regulation (01/24/2015), Hypothyroid, Migraines, Obesity, and UTI (urinary tract infection).    has a past medical history of Encounter for menstrual regulation (01/24/2015), Hypothyroid, Migraines, Obesity, and UTI (urinary tract infection).   reports that she has never smoked. She has never used smokeless tobacco.  Past Surgical History:  Procedure Laterality Date   broken collar bone  1994    Allergies  Allergen Reactions   Elemental Sulfur Rash    Immunization History  Administered Date(s) Administered   Moderna Sars-Covid-2 Vaccination 12/06/2019, 01/05/2020    Family History  Problem  Relation Age of Onset   Hypertension Paternal Grandmother    Coronary artery disease Paternal Grandmother    Hypertension Maternal Grandmother    Diabetes Maternal Grandmother    Hyperlipidemia Maternal Grandmother    Thyroid disease Maternal Grandmother    Kidney disease Maternal Grandmother    Diabetes Father    Hypertension Mother    Hyperlipidemia Mother    COPD Paternal Grandfather    Heart disease Maternal Grandfather    Diabetes Maternal Grandfather    Hypertension Brother    Hyperlipidemia Brother      Current Facility-Administered Medications:    Chlorhexidine Gluconate Cloth 2 % PADS 6 each, 6 each, Topical, Daily, Callaway Hardigree, MD, 6 each at 02/06/21 2054   magnesium sulfate IVPB 2 g 50 mL, 2 g, Intravenous, Once, Duayne Cal, NP   Significant Hospital Events: Including procedures, antibiotic start and stop dates in addition to other pertinent events   6/27 admit to [place] for hemotpysis. Dx PE. Worsening hemoptysis  6/28 tx to [facility] > then to ICU due to hypotension, hemoptysis.   Interim History / Subjective:    Objective   Blood pressure 113/82, pulse (!) 127, temperature 97.9 F (36.6 C), temperature source Oral, resp. rate 19, height 5' 4.5" (1.638 m), weight 104.9 kg, last menstrual period 10/24/2020, SpO2 100 %.       No intake or output data in the 24 hours ending 02/06/21 2033 Filed Weights   02/06/21 1812 02/06/21 2015  Weight: 103 kg 104.9 kg    Examination: General: Overweight female in NAD HENT: [place]/AT, PERRL, no JVD Lungs: Coarse bases, no distress. Sats in the high 90s on room air.  Cardiovascular: Tachy, regular, no MRG. 3+ pitting edema in bilateral lower extremities to the knees.  Abdomen: Soft, non-tender, non-distended. Extremities: Edema as above, no acute deformity or ROM limitations.  Neuro: Alert, oriented, non-focal.    LABS    PULMONARY No results for input(s): PHART, PCO2ART, PO2ART, HCO3, TCO2, O2SAT in the last  168 hours.  Invalid input(s): PCO2, PO2  CBC Recent Labs  Lab 02/06/21 2008 02/06/21 2018  HGB 12.6  --   HCT 38.3  --   WBC 17.5*  --   PLT 156 172    COAGULATION Recent Labs  Lab 02/06/21 2008 02/06/21 2018  INR 1.9* 1.9*    CARDIAC  No results for input(s): TROPONINI in the last 168 hours. No results for input(s): PROBNP in the last 168 hours.   CHEMISTRY Recent Labs  Lab 02/06/21 2008  NA 131*  K 4.0  CL 96*  CO2 18*  GLUCOSE 135*  BUN 21*  CREATININE 1.13*  CALCIUM 7.9*  MG 1.6*   Estimated Creatinine Clearance: 84.4 mL/min (A) (by C-G formula based on SCr of 1.13 mg/dL (H)).   LIVER Recent Labs  Lab 02/06/21 2008 02/06/21 2018  AST 39  --   ALT 55*  --   ALKPHOS 61  --   BILITOT 3.5*  --   PROT 5.2*  --   ALBUMIN 2.4*  --   INR 1.9* 1.9*     INFECTIOUS Recent Labs  Lab 02/06/21 1919 02/06/21 2017  LATICACIDVEN  --  4.3*  PROCALCITON 2.31  --      ENDOCRINE CBG (last 3)  Recent Labs    02/06/21 2022  GLUCAP 134*         IMAGING x48h  - image(s) personally visualized  -   highlighted in bold CT CHEST WO CONTRAST  Result Date: 02/06/2021 CLINICAL DATA:  Hemoptysis EXAM: CT CHEST WITHOUT CONTRAST TECHNIQUE: Multidetector CT imaging of the chest was performed following the standard protocol without IV contrast. COMPARISON:  None. FINDINGS: Cardiovascular: Heart is normal size. Aorta is normal caliber. Mediastinum/Nodes: Soft tissue in the anterior mediastinum felt represent residual thymus. No mediastinal, hilar, or axillary adenopathy. Trachea and esophagus are unremarkable. Thyroid unremarkable. Lungs/Pleura: Dense consolidation in the right lower lobe. Ground-glass airspace opacities in the right middle lobe. Consolidation also noted in the left lower lobe. Findings compatible with pneumonia. No effusions. Upper Abdomen: Imaging into the upper abdomen demonstrates no acute findings. Musculoskeletal: Chest wall soft tissues are  unremarkable. No acute bony abnormality. IMPRESSION: Areas of consolidation in the right lower lobe, left lower lobe and right middle lobe compatible with multifocal pneumonia. Electronically Signed   By: Charlett Nose M.D.   On: 02/06/2021 22:35   DG CHEST PORT 1 VIEW  Result Date: 02/06/2021 CLINICAL DATA:  35 year old female with hemoptysis. EXAM: PORTABLE CHEST 1 VIEW COMPARISON:  None. FINDINGS: Focal right lung base opacity may represent atelectasis but concerning for pneumonia. Clinical correlation is recommended. The left lung is clear. No pleural effusion pneumothorax. There is mild cardiomegaly. No acute osseous pathology. IMPRESSION: Right lung base opacity concerning for pneumonia. Electronically Signed   By: Elgie Collard M.D.   On: 02/06/2021 19:44   ECHOCARDIOGRAM LIMITED  Result Date: 02/06/2021    ECHOCARDIOGRAM REPORT   Patient Name:   Debra Daniel Date of Exam: 02/06/2021 Medical Rec #:  [id number]     Height:       64.5 in Accession #:    [id number]    Weight:  227.1 lb Date of Birth:  03-21-1986     BSA:          2.076 m Patient Age:    35 years      BP:           109/88 mmHg Patient Gender: F             HR:           132 bpm. Exam Location:  Inpatient Procedure: Limited Echo, Limited Color Doppler and Cardiac Doppler STAT ECHO Indications:    Cardiomyopathy  History:        Patient has prior history of Echocardiogram examinations. CHF;                 Signs/Symptoms:Hemoptysis.  Sonographer:    Delcie Roch Referring Phys: [id number] Brylin Stopper IMPRESSIONS  1. Left ventricular ejection fraction, by estimation, is 10-15%. The left ventricle has severely decreased function. The left ventricle demonstrates global hypokinesis. The left ventricular internal cavity size was moderately dilated. Left ventricular diastolic function could not be evaluated.  2. Right ventricular systolic function is moderate-severely reduced. The right ventricular size is moderately enlarged. There is  moderately elevated pulmonary artery systolic pressure. The estimated right ventricular systolic pressure is 45.9 mmHg.  3. Left atrial size was mildly dilated.  4. The mitral valve is grossly normal. Severe, secondary mitral valve regurgitation with leaflet tenting secondary to LV dysfunction. No evidence of mitral stenosis.  5. Tricuspid valve regurgitation is severe.  6. The aortic valve is normal in structure. Aortic valve regurgitation is not visualized.  7. The inferior vena cava is normal in size with <50% respiratory variability, suggesting right atrial pressure of 8 mmHg. FINDINGS  Left Ventricle: Left ventricular ejection fraction, by estimation, is 10-15%. The left ventricle has severely decreased function. The left ventricle demonstrates global hypokinesis. The left ventricular internal cavity size was moderately dilated. There  is no left ventricular hypertrophy. Left ventricular diastolic function could not be evaluated. Right Ventricle: The right ventricular size is moderately enlarged. No increase in right ventricular wall thickness. Right ventricular systolic function is moderate-severely reduced. There is moderately elevated pulmonary artery systolic pressure. The tricuspid regurgitant velocity is 3.08 m/s, and with an assumed right atrial pressure of 8 mmHg, the estimated right ventricular systolic pressure is 45.9 mmHg. Left Atrium: Left atrial size was mildly dilated. Right Atrium: Right atrial size was normal in size. Pericardium: There is no evidence of pericardial effusion. Mitral Valve: The mitral valve is grossly normal. Severe mitral valve regurgitation. No evidence of mitral valve stenosis. MV peak gradient, 85.0 mmHg. Tricuspid Valve: The tricuspid valve is grossly normal. Tricuspid valve regurgitation is severe. Aortic Valve: The aortic valve is normal in structure. Aortic valve regurgitation is not visualized. Pulmonic Valve: The pulmonic valve was not well visualized. Aorta: The  aortic root is normal in size and structure. Pulmonary Artery: The pulmonary artery is of normal size. Venous: The inferior vena cava is normal in size with less than 50% respiratory variability, suggesting right atrial pressure of 8 mmHg. IAS/Shunts: No atrial level shunt detected by color flow Doppler.  LEFT VENTRICLE PLAX 2D LVIDd:         6.00 cm  Diastology LVIDs:         5.40 cm  LV e' medial:  6.85 cm/s LV PW:         1.10 cm  LV e' lateral: 12.90 cm/s LV IVS:        0.90  cm LVOT diam:     2.00 cm LV SV:         18 LV SV Index:   9 LVOT Area:     3.14 cm  IVC IVC diam: 2.20 cm LEFT ATRIUM           Index LA diam:      5.00 cm 2.41 cm/m LA Vol (A4C): 79.3 ml 38.20 ml/m  AORTIC VALVE LVOT Vmax:   48.40 cm/s LVOT Vmean:  31.300 cm/s LVOT VTI:    0.058 m  AORTA Ao Asc diam: 2.30 cm MITRAL VALVE                 TRICUSPID VALVE MV Peak grad: 85.0 mmHg      TR Peak grad:   37.9 mmHg MV Vmax:      4.61 m/s       TR Vmax:        308.00 cm/s MR Peak grad:    91.4 mmHg MR Mean grad:    57.0 mmHg   SHUNTS MR Vmax:         478.00 cm/s Systemic VTI:  0.06 m MR Vmean:        365.0 cm/s  Systemic Diam: 2.00 cm MR PISA:         1.27 cm MR PISA Eff ROA: 11 mm MR PISA Radius:  0.45 cm Weston Brass MD Electronically signed by Weston Brass MD Signature Date/Time: 02/06/2021/9:39:14 PM    Final      Resolved Hospital Problem list     Assessment & Plan:   Severe Acute  Systoloc CHF HFrEF: new diagnosis with LVEF 10-15%. Global hypokinesis. Severely reduced RV systolic function. Severe secondary MR. BNP 1500. (POA) - Cardiology following, recommending trial of diuresis and and inotrope - 20mg  IV lasix now - Will attempt to get clear sight monitor hooked up and start milrinone infusion - Place PICC - Place foley.  - Echo pending - Cycle troponin - no further IVF  Masssive Hemoptysis (POA): etiology unclear. PE related vs DAH - CT without contrast at [facility] showing bilateral Alveolar hemorrhage R > L at base  with craniocaudal granident - features not c.w tracheobronchial tree reputure (therefore limited role for bronch) - Autoimmune, Vasculitis workup from 6/28  pending - Quant Gold 6/28 - check Urine strep, legionella, RVP, MRSA PCR - urine pregnancy - check UDS - Need to find out what antibiotics she was on for UTI, Macrobid can cause vasculitis/ILD. Pharmacy consult placed.  - Close monitoring of pulmonary status.  - Likely needs bronch. High right sided heart pressures on bedside echo high risk for sedation - Holding anticoagulation - If she gets intubated, will plan to bronch for BAL and cultures  Pulmonary emobli per report from outside hospital [facility]): segmental and subsegmental PE  - Cannot anticoagulate at this time due to hemoptysis + outside hospital heparin made things orse  Plan  - get outside CTA and have our radiologist eval for PE - LE dopplers, may need filter - O2 sat monitoring - Echo pending - get   AKI (POA)  - monitor with cardiac Rx   Elevated anion gap metabolic lactic acidosis: lactic acid 4.5 - present on admission  - ? Diue to poor cardiac outpput  Hypomagnesemia  - Supp mag - Repeat BMP  - Repeat mag in AM  Hypothyroidism: TSH 13 and t4 mildly elevated as well on admission. Possibly due to heparin? May need further workup once stabilized.  -  Hold home synthroid for now.   Acute hypoxemic resp failure - present on admission due to above. Mild due to above  On 2L [place]  Plan  -goal pulse ox > 88%  Best Practice (right click and "Reselect all SmartList Selections" daily)   Diet/type: NPO and NPO w/ oral meds DVT prophylaxis: SCD GI prophylaxis: PPI Lines: N/A Foley:  Yes, and it is still needed Code Status:  full code Last date of multidisciplinary goals of care discussion [ patient and parents at bedside]   Xxxxxxxx   ATTESTATION & SIGNATURE   STAFF NOTE: I, Dr Lavinia Sharps have personally reviewed patient's available data, including  medical history, events of note, physical examination and test results as part of my evaluation. I have discussed with resident/NP and other care providers such as pharmacist, RN and RRT.  In addition,  I personally evaluated patient and elicited key findings of   S: Date of admit 02/06/2021 with LOS 0 for today 02/06/2021 : Donasia Wimes is  - per parnets unwell NOS x 2-3 months. Per patient edema pedal x few weeks and  rx for UTI and finally admit 6/27 with hemoptuysis at [place]. Subsegmental PE and new onset systolic CHF ef 10% diagnosed. Rx IV heparin and hemoptysis worse and transsfered to [facility]. On arrival ongiing servere hemoptysis. CXR only wit RLL GGO. Needing 2l [place]   WBC 17K PCT 2 No fever though INR Up  Denies CTD Denies pregnanyc Denies drug abuse Denies rheumatic heart disease Denies sepsis sybndrome  Reent Macrobid intake + for UTI Recent dx of hypothyroidism NOS  ECHO with EF 10% here and Mitral regurg mode and RV PASP elevation  O:  Blood pressure 113/82, pulse (!) 125, temperature 97.9 F (36.6 C), temperature source Oral, resp. rate 18, height 5' 4.5" (1.638 m), weight 104.9 kg, last menstrual period 10/24/2020, SpO2 100 %.   Obese Coughs blood 3+ edema ? Murmur Cool to touch AxOx3 Abd soft   LABS    PULMONARY No results for input(s): PHART, PCO2ART, PO2ART, HCO3, TCO2, O2SAT in the last 168 hours.  Invalid input(s): PCO2, PO2  CBC Recent Labs  Lab 02/06/21 2008 02/06/21 2018  HGB 12.6  --   HCT 38.3  --   WBC 17.5*  --   PLT 156 172    COAGULATION Recent Labs  Lab 02/06/21 2008 02/06/21 2018  INR 1.9* 1.9*    CARDIAC  No results for input(s): TROPONINI in the last 168 hours. No results for input(s): PROBNP in the last 168 hours.   CHEMISTRY Recent Labs  Lab 02/06/21 2008  NA 131*  K 4.0  CL 96*  CO2 18*  GLUCOSE 135*  BUN 21*  CREATININE 1.13*  CALCIUM 7.9*  MG 1.6*   Estimated Creatinine Clearance: 84.4 mL/min (A)  (by C-G formula based on SCr of 1.13 mg/dL (H)).   LIVER Recent Labs  Lab 02/06/21 2008 02/06/21 2018  AST 39  --   ALT 55*  --   ALKPHOS 61  --   BILITOT 3.5*  --   PROT 5.2*  --   ALBUMIN 2.4*  --   INR 1.9* 1.9*     INFECTIOUS Recent Labs  Lab 02/06/21 1919 02/06/21 2017  LATICACIDVEN  --  4.3*  PROCALCITON 2.31  --      ENDOCRINE CBG (last 3)  Recent Labs    02/06/21 2022  GLUCAP 134*         IMAGING x24h  -  image(s) personally visualized  -   highlighted in bold CT CHEST WO CONTRAST  Result Date: 02/06/2021 CLINICAL DATA:  Hemoptysis EXAM: CT CHEST WITHOUT CONTRAST TECHNIQUE: Multidetector CT imaging of the chest was performed following the standard protocol without IV contrast. COMPARISON:  None. FINDINGS: Cardiovascular: Heart is normal size. Aorta is normal caliber. Mediastinum/Nodes: Soft tissue in the anterior mediastinum felt represent residual thymus. No mediastinal, hilar, or axillary adenopathy. Trachea and esophagus are unremarkable. Thyroid unremarkable. Lungs/Pleura: Dense consolidation in the right lower lobe. Ground-glass airspace opacities in the right middle lobe. Consolidation also noted in the left lower lobe. Findings compatible with pneumonia. No effusions. Upper Abdomen: Imaging into the upper abdomen demonstrates no acute findings. Musculoskeletal: Chest wall soft tissues are unremarkable. No acute bony abnormality. IMPRESSION: Areas of consolidation in the right lower lobe, left lower lobe and right middle lobe compatible with multifocal pneumonia. Electronically Signed   By: Charlett Nose M.D.   On: 02/06/2021 22:35   DG CHEST PORT 1 VIEW  Result Date: 02/06/2021 CLINICAL DATA:  35 year old female with hemoptysis. EXAM: PORTABLE CHEST 1 VIEW COMPARISON:  None. FINDINGS: Focal right lung base opacity may represent atelectasis but concerning for pneumonia. Clinical correlation is recommended. The left lung is clear. No pleural effusion  pneumothorax. There is mild cardiomegaly. No acute osseous pathology. IMPRESSION: Right lung base opacity concerning for pneumonia. Electronically Signed   By: Elgie Collard M.D.   On: 02/06/2021 19:44   ECHOCARDIOGRAM LIMITED  Result Date: 02/06/2021    ECHOCARDIOGRAM REPORT   Patient Name:   Debra Daniel Date of Exam: 02/06/2021 Medical Rec #:  [id number]     Height:       64.5 in Accession #:    [id number]    Weight:       227.1 lb Date of Birth:  September 04, 1985     BSA:          2.076 m Patient Age:    35 years      BP:           109/88 mmHg Patient Gender: F             HR:           132 bpm. Exam Location:  Inpatient Procedure: Limited Echo, Limited Color Doppler and Cardiac Doppler STAT ECHO Indications:    Cardiomyopathy  History:        Patient has prior history of Echocardiogram examinations. CHF;                 Signs/Symptoms:Hemoptysis.  Sonographer:    Delcie Roch Referring Phys: 56 Arvella Massingale IMPRESSIONS  1. Left ventricular ejection fraction, by estimation, is 10-15%. The left ventricle has severely decreased function. The left ventricle demonstrates global hypokinesis. The left ventricular internal cavity size was moderately dilated. Left ventricular diastolic function could not be evaluated.  2. Right ventricular systolic function is moderate-severely reduced. The right ventricular size is moderately enlarged. There is moderately elevated pulmonary artery systolic pressure. The estimated right ventricular systolic pressure is 45.9 mmHg.  3. Left atrial size was mildly dilated.  4. The mitral valve is grossly normal. Severe, secondary mitral valve regurgitation with leaflet tenting secondary to LV dysfunction. No evidence of mitral stenosis.  5. Tricuspid valve regurgitation is severe.  6. The aortic valve is normal in structure. Aortic valve regurgitation is not visualized.  7. The inferior vena cava is normal in size with <50% respiratory variability, suggesting right atrial  pressure of 8  mmHg. FINDINGS  Left Ventricle: Left ventricular ejection fraction, by estimation, is 10-15%. The left ventricle has severely decreased function. The left ventricle demonstrates global hypokinesis. The left ventricular internal cavity size was moderately dilated. There  is no left ventricular hypertrophy. Left ventricular diastolic function could not be evaluated. Right Ventricle: The right ventricular size is moderately enlarged. No increase in right ventricular wall thickness. Right ventricular systolic function is moderate-severely reduced. There is moderately elevated pulmonary artery systolic pressure. The tricuspid regurgitant velocity is 3.08 m/s, and with an assumed right atrial pressure of 8 mmHg, the estimated right ventricular systolic pressure is 45.9 mmHg. Left Atrium: Left atrial size was mildly dilated. Right Atrium: Right atrial size was normal in size. Pericardium: There is no evidence of pericardial effusion. Mitral Valve: The mitral valve is grossly normal. Severe mitral valve regurgitation. No evidence of mitral valve stenosis. MV peak gradient, 85.0 mmHg. Tricuspid Valve: The tricuspid valve is grossly normal. Tricuspid valve regurgitation is severe. Aortic Valve: The aortic valve is normal in structure. Aortic valve regurgitation is not visualized. Pulmonic Valve: The pulmonic valve was not well visualized. Aorta: The aortic root is normal in size and structure. Pulmonary Artery: The pulmonary artery is of normal size. Venous: The inferior vena cava is normal in size with less than 50% respiratory variability, suggesting right atrial pressure of 8 mmHg. IAS/Shunts: No atrial level shunt detected by color flow Doppler.  LEFT VENTRICLE PLAX 2D LVIDd:         6.00 cm  Diastology LVIDs:         5.40 cm  LV e' medial:  6.85 cm/s LV PW:         1.10 cm  LV e' lateral: 12.90 cm/s LV IVS:        0.90 cm LVOT diam:     2.00 cm LV SV:         18 LV SV Index:   9 LVOT Area:     3.14 cm  IVC  IVC diam: 2.20 cm LEFT ATRIUM           Index LA diam:      5.00 cm 2.41 cm/m LA Vol (A4C): 79.3 ml 38.20 ml/m  AORTIC VALVE LVOT Vmax:   48.40 cm/s LVOT Vmean:  31.300 cm/s LVOT VTI:    0.058 m  AORTA Ao Asc diam: 2.30 cm MITRAL VALVE                 TRICUSPID VALVE MV Peak grad: 85.0 mmHg      TR Peak grad:   37.9 mmHg MV Vmax:      4.61 m/s       TR Vmax:        308.00 cm/s MR Peak grad:    91.4 mmHg MR Mean grad:    57.0 mmHg   SHUNTS MR Vmax:         478.00 cm/s Systemic VTI:  0.06 m MR Vmean:        365.0 cm/s  Systemic Diam: 2.00 cm MR PISA:         1.27 cm MR PISA Eff ROA: 11 mm MR PISA Radius:  0.45 cm Weston BrassGayatri Acharya MD Electronically signed by Weston BrassGayatri Acharya MD Signature Date/Time: 02/06/2021/9:39:14 PM    Final       A: Acute mild resp failure but has severe sysstolic cardiomyopathy and massive hemoptysis with PASP elevation and moder-sever mitral regurg (functional)  Unclera if she really had PE  Some features  to suggess bacterial infectious process - ? PNA  - high wbc, high PCT  CT chest wtihout contrast here with GGO c/w alveolar hge RLL > LLLL  Hypomag - present on admit Hyponatremia - present on admit  AKI due to above p on admit  P: get outside CTA (Radiology tyring to do this tonight) Cardiac suport - >? Needs ionotropy No role for bronch right now - can if we need cultures or need to confirm alv hge Vasculitis, infectious, autoimmuen workup now + DIC panel Empiric abx Hold off IV heparin - if PE confirmed on outside CTA consider filter Likley needs RHC  Anti-infectives (From admission, onward)    None        Rest per NP/medical resident whose note is outlined above and that I agree with  The patient is critically ill with multiple organ systems failure and requires high complexity decision making for assessment and support, frequent evaluation and titration of therapies, application of advanced monitoring technologies and extensive interpretation of multiple  databases.   Critical Care Time devoted to patient care services described in this note is  60  Minutes. This time reflects time of care of this signee Dr Kalman Shan. This critical care time does not reflect procedure time, or teaching time or supervisory time of PA/NP/Med student/Med Resident etc but could involve care discussion time     Dr. Kalman Shan, M.D., [facility].C.P Pulmonary and Critical Care Medicine Staff Physician [facility] Pager: [phone], If no answer or between  15:00h - 7:00h: call [phone]  02/06/2021 10:43 PM

## 2021-02-06 NOTE — Plan of Care (Signed)
Pt was seen on arrival, noted to have severe hemoptysis HR 130 BP 90/60 Edematous, mild crackles lung bases on RA Pt w 3 wk hx of cough and leg edema 4 days hx of hemoptysis was diagnosed with subseg PE, Pulmonary hemorrhage and acute systolic CHF with EF around 20% at [place] Heparin was started but hemoptysis has gotten worse  Heparin was stopped and she was transferred to [facility] for cardiology and pulmonology consult  Pt appears unstable on arrival STAT PCCM consult was called Appreciate their input pt to be transferred to ICU for close monitoring of her airway possible intubation if needed  Cardiology was consulted for stat echo and evaluation Appreciate their consult Order type and screen, CBC, CMET, ANA  Pt is being transferred to ICU 7:53 PM Sharaine Delange

## 2021-02-06 NOTE — Progress Notes (Signed)
Pt transferred to 3M05 for higher level of care. On arrival to [facility] pt consistently coughing up BRB. VSS throughout transfer. On 2L o2. In NAD. Family at bedside and aware of transfer and plan of care.

## 2021-02-06 NOTE — Telephone Encounter (Signed)
Received a phone call from Facility: Suzi Roots MD: Dr. Linna Darner, hospitalist.  Patient with h/o hypothyroidism, obesity of 41 presenting with cough and hemoptysis. CT shows pneumonia with ground glass opacity and alveolar hemorrhage. Echo with EF of 10-15%. ? Viral cardiomyopathy. Needs advanced CHF team and pulmonology consult for hemoptysis. Hemoglobin has been stable. On room air. Hospitalist spoke to cardiologist here at [facility] who recommended she be transferred here for our CHF team/cardiology. Will need to consult pulmonology on arrival/admit.  Plan of care: admit to med tele.  The patient will be accepted for admission to telemetry at [facility] when bed is available.  -will need to consult CHF team/cards and pulmonology on arrival/admit.    Lanney Gins, M.D. [facility]

## 2021-02-06 NOTE — Progress Notes (Signed)
  Echocardiogram 2D Echocardiogram has been performed.  Delcie Roch 02/06/2021, 9:10 PM

## 2021-02-06 NOTE — Progress Notes (Signed)
eLink Physician-Brief Progress Note Patient Name: Debra Daniel DOB: [birth year] MRN: [id number]   Date of Service  02/06/2021  HPI/Events of Note  Patient is a critically ill patient who needs a foley catheter for close urine output monitoring while being diuresed.  eICU Interventions  Foley catheter ordered.        Thomasene Lot Golden Emile 02/06/2021, 9:16 PM

## 2021-02-06 NOTE — Progress Notes (Signed)
Pharmacy Antibiotic Note  Debra Daniel is a 35 y.o. female admitted on 02/06/2021 with sepsis.  Pharmacy has been consulted for Vancomycin and Cefepime dosing.  Plan: Cefepime 2gm IV q8h Vancomycin 2000mg  IV now then 1500 mg IV Q 24 hrs. Goal AUC 400-550. Expected AUC: 510 SCr used: 1.13. Vd coeff 0.5 Will f/u renal function, micro data, and pt's clinical condition Vanc levels prn   Height: 5' 4.5" (163.8 cm) Weight: 104.9 kg (231 lb 4.2 oz) IBW/kg (Calculated) : 55.85  Temp (24hrs), Avg:98 F (36.7 C), Min:97.9 F (36.6 C), Max:98 F (36.7 C)  Recent Labs  Lab 02/06/21 2008 02/06/21 2017  WBC 17.5*  --   CREATININE 1.13*  --   LATICACIDVEN  --  4.3*    Estimated Creatinine Clearance: 84.4 mL/min (A) (by C-G formula based on SCr of 1.13 mg/dL (H)).    Allergies  Allergen Reactions   Elemental Sulfur Rash    Antimicrobials this admission: 6/28 Vanc >>  6/28 Cefepime >>   Microbiology results: 6/28 MRSA PCR: negative  Thank you for allowing pharmacy to be a part of this patient's care.  7/28, PharmD, BCPS Please see amion for complete clinical pharmacist phone list 02/06/2021 10:59 PM

## 2021-02-06 NOTE — Consult Note (Addendum)
Cardiology Consultation:   Patient ID: Debra Daniel MRN: [id number]; DOB: 10-13-1985  Admit date: 02/06/2021 Date of Consult: 02/06/2021  PCP:  Patient, No Pcp Per (Inactive)   [facility] Providers Cardiologist:  None        Patient Profile:   Debra Daniel is a 35 y.o. female with a hx of recent UTI in May and recent hypothyroid who is being seen 02/06/2021 for the evaluation of cardiomyopathy at the request of Dr. Marchelle Gearing.  History of Present Illness:   Ms. Keicher in normal health until May had UTI and treated then had cough that continued, harsh cough productive at times with clear mucus.  On Sat the 25th she developed hemoptysis.   Went to ER and found to have acute PE, subsegmental and segmental of RLL and massive hemoptysis secondary to alveolar hemorrhage. Diffuse anasarca.   She had been on OCP which were stopped.  She was placed on IV heparin she had 1 pint of hemoptysis on MD eval in [place].  Hgb was stable.  Echo EF 25-30% basal inf. Wall appears akinetic.  LA is dilated RVSP moderately elevated. (HR was 120)   BNP 1567, hs troponin 24 INR 1.5 APTT 30  WBC 17.85 Hgb 13.7 plts 170  na 130 K+ 3.6 Cr 1.22 AST 48 and alt 77   Today K+ 3.1 Na 131 Cr 1.08  Hgb 12.6 Covid neg.  BP mostly stable. No chest pain and no SOB.  EKG:  The EKG was personally reviewed and demonstrates:  did not find one from [place], SR with PVCs on tele strip  Telemetry:  Telemetry was personally reviewed and demonstrates:  [facility] and cardiology saw in [place] and plans made to transfer to [facility].    Here she is spitting up blood.  No chest pain or SOB.   Her legs are cold but she states they have always been this way.  Trunk is warm.   CXR here   FINDINGS: Focal right lung base opacity may represent atelectasis but concerning for pneumonia. Clinical correlation is recommended. The left lung is clear. No pleural effusion pneumothorax. There is mild cardiomegaly. No acute osseous pathology.   IMPRESSION: Right lung base opacity concerning for pneumonia.   BP 98/76 to 113/82 P 127 resp 19 afebrile  Past Medical History:  Diagnosis Date   Encounter for menstrual regulation 01/24/2015   Hypothyroid    Migraines    Obesity    UTI (urinary tract infection)     Past Surgical History:  Procedure Laterality Date   broken collar bone  1994     Home Medications:  Prior to Admission medications   Medication Sig Start Date End Date Taking? Authorizing Provider  DAYSEE 0.15-0.03 &0.01 MG tablet TAKE 1 TABLET BY MOUTH ONCE DAILY. 01/16/21   [name], NP  promethazine (PHENERGAN) 25 MG suppository Place 1 suppository (25 mg total) rectally every 6 (six) hours as needed for nausea or vomiting. 12/23/20   Melene Plan, DO  promethazine (PHENERGAN) 25 MG tablet Take 1 tablet (25 mg total) by mouth every 6 (six) hours as needed for nausea or vomiting. 12/23/20   Melene Plan, DO    Inpatient Medications: Scheduled Meds:  furosemide  20 mg Intravenous Once   Continuous Infusions:  PRN Meds:   Allergies:    Allergies  Allergen Reactions   Elemental Sulfur Rash    Social History:   Social History   Socioeconomic History   Marital status: Single  Spouse name: Not on file   Number of children: Not on file   Years of education: Not on file   Highest education level: Not on file  Occupational History   Not on file  Tobacco Use   Smoking status: Never   Smokeless tobacco: Never  Vaping Use   Vaping Use: Never used  Substance and Sexual Activity   Alcohol use: No   Drug use: No   Sexual activity: Never    Birth control/protection: Pill  Other Topics Concern   Not on file  Social History Narrative   Not on file   Social Determinants of Health   Financial Resource Strain: Low Risk    Difficulty of Paying Living Expenses: Not hard at all  Food Insecurity: No Food Insecurity   Worried About Programme researcher, broadcasting/film/video in the Last Year: Never true   Ran Out of Food  in the Last Year: Never true  Transportation Needs: No Transportation Needs   Lack of Transportation (Medical): No   Lack of Transportation (Non-Medical): No  Physical Activity: Insufficiently Active   Days of Exercise per Week: 2 days   Minutes of Exercise per Session: 30 min  Stress: No Stress Concern Present   Feeling of Stress : Not at all  Social Connections: Socially Isolated   Frequency of Communication with Friends and Family: More than three times a week   Frequency of Social Gatherings with Friends and Family: More than three times a week   Attends Religious Services: Never   Database administrator or Organizations: No   Attends Engineer, structural: Never   Marital Status: Never married  Catering manager Violence: Not At Risk   Fear of Current or Ex-Partner: No   Emotionally Abused: No   Physically Abused: No   Sexually Abused: No    Family History:    Family History  Problem Relation Age of Onset   Hypertension Paternal Grandmother    Coronary artery disease Paternal Grandmother    Hypertension Maternal Grandmother    Diabetes Maternal Grandmother    Hyperlipidemia Maternal Grandmother    Thyroid disease Maternal Grandmother    Kidney disease Maternal Grandmother    Diabetes Father    Hypertension Mother    Hyperlipidemia Mother    COPD Paternal Grandfather    Heart disease Maternal Grandfather    Diabetes Maternal Grandfather    Hypertension Brother    Hyperlipidemia Brother      ROS:  Please see the history of present illness.  General:no colds or fevers, no weight changes Skin:no rashes or ulcers HEENT:no blurred vision, no congestion CV:see HPI PUL:see HPI GI:no diarrhea constipation or melena, no indigestion GU:no hematuria, no dysuria MS:no joint pain, no claudication Neuro:no syncope, no lightheadedness Endo:no diabetes, + thyroid disease recently dx.   All other ROS reviewed and negative.     Physical Exam/Data:   Vitals:    02/06/21 1812  BP: 98/76  Pulse: 99  Resp: 18  Temp: 98 F (36.7 C)  TempSrc: Oral  Weight: 103 kg  Height: 5' 4.5" (1.638 m)   No intake or output data in the 24 hours ending 02/06/21 1941 Last 3 Weights 02/06/2021 12/22/2020 07/25/2020  Weight (lbs) 227 lb 1.2 oz 214 lb 3.2 oz 225 lb  Weight (kg) 103 kg 97.16 kg 102.059 kg     Body mass index is 38.38 kg/m.  General:  Well nourished, well developed, in no acute distress though anxious and coughing and spitting  up blood. HEENT: normal Lymph: no adenopathy Neck: no JVD Endocrine:  No thryomegaly Vascular: No carotid bruits; FA pulses 2+ bilaterally without bruits  Cardiac:  normal S1, S2; RRR; no murmur gallup rub or click Lungs:  clear to auscultation bilaterally, though diminished no wheezing, rhonchi or rales  Abd: soft, nontender, no hepatomegaly  Ext: + lower ext edema feet 3-4+ and mid calf 2+ Musculoskeletal:  No deformities, BUE and BLE strength normal and equal Skin: warm and dry of trunk legs cold  Neuro:  alert and oriented X 3 MAE follows commands, no focal abnormalities noted Psych:  Normal affect    Relevant CV Studies: Echo at [facility]  Echo EF 25-30% basal inf. Wall appears akinetic.  LA is dilated RVSP moderately elevated. (HR was 120)    Laboratory Data:  High Sensitivity Troponin:  No results for input(s): TROPONINIHS in the last 720 hours.   ChemistryNo results for input(s): NA, K, CL, CO2, GLUCOSE, BUN, CREATININE, CALCIUM, GFRNONAA, GFRAA, ANIONGAP in the last 168 hours.  No results for input(s): PROT, ALBUMIN, AST, ALT, ALKPHOS, BILITOT in the last 168 hours. HematologyNo results for input(s): WBC, RBC, HGB, HCT, MCV, MCH, MCHC, RDW, PLT in the last 168 hours. BNPNo results for input(s): BNP, PROBNP in the last 168 hours.  DDimer No results for input(s): DDIMER in the last 168 hours.   Radiology/Studies:  No results found.   Assessment and Plan:   PE and hemoptysis with coughing up spitting up  bright blood.  Heparin stopped pulmonary CCM here to eval New CM with elevated BNP she denies SOB does have lower ext edema.   No obvious murmurs. Will get stat EKG and stat echo. Dr. Jacques Navy arriving at Bedside Hypothyroid new   Risk Assessment/Risk Scores:                For questions or updates, please contact [facility] Please consult [email or web address] for contact info under    Signed, Nada Boozer, NP  02/06/2021 7:41 PM   ----------------------------------------------------------------------------------------------   History and all data above reviewed.  Patient examined.  I agree with the findings as above.  Alizah Sills is a 35 year old female with a history of UTI in May recent diagnosis of hypothyroidism but no known past cardiovascular history on whom we are consulted for massive hemoptysis and new cardiomyopathy.  Over the last several days she has had massive hemoptysis after starting heparin for acute PE documented in [place].  She has no known vasculitis history, bleeding disorder, or etiology for cardiomyopathy.  She has not had children therefore this does not represent a peripartum cardiomyopathy.  She is in the room actively coughing up bright red blood and clots.  I was called to the bedside to assess the patient and stat echocardiogram was ordered.  Patient is currently hemodynamically stable with sinus tachycardia, low normal blood pressure on no hemodynamic support, and on 2 L of O2 with a saturation of 100%.  Stat echocardiogram performed by the overnight sonographer Lauren as well as images obtained personally.  This study demonstrates severe LV dysfunction with an EF of 10 to 15%, severe mitral valve regurgitation which appears secondary to LV dysfunction with no primary mitral valve issues.  Moderate to severe RV dysfunction with moderate enlargement of the RV and severe tricuspid valve regurgitation.  Moderate pulmonary hypertension with an  estimated RA pressure of 8 mmHg.  Cannot comment on regional wall motion given severity of LV dysfunction.  Moderate LV enlargement.  Constitutional:  Appears unwell Eyes: pupils equally round and reactive to light, sclera non-icteric, normal conjunctiva and lids ENMT: normal dentition, moist mucous membranes stained with blood Cardiovascular: regular rhythm, tachycardic rate, apical holosystolic murmur.  Respiratory: Clear GI : normal bowel sounds, soft and nontender. No distention.   MSK: extremities warm, well perfused.  Diffuse bilateral edema, 1+ NEURO: grossly nonfocal exam, moves all extremities. PSYCH: alert and oriented x 3, normal mood and affect.   All available labs, radiology testing, previous records reviewed. Agree with documented assessment and plan of my colleague as stated above with the following additions or changes:  Active Problems:   Hemoptysis   Acute systolic CHF (congestive heart failure) (HCC)   Pulmonary embolism (HCC)   Hypothyroidism   Tachycardia    Plan: She is critically ill in the ICU with massive hemoptysis of unknown etiology and a new biventricular cardiomyopathy.  Most suspicious for a stress cardiomyopathic process.  Her troponin is within a normal range and does not suggest fulminant myocarditis in the setting of either vasculitis or viral illness.  I am surprised to see that her repeat hemoglobin is within the normal range despite hemoptysis.  I discussed the patient's care with the bedside nurses as well as PCCM.  She does not appear to be in florid heart failure or pulmonary edema currently.  Significant concern has been raised for mitral regurgitation as the etiology for her frank hemoptysis.  This would be extremely unlikely, though severe mitral valve regurgitation may be contributing.  I am concerned for a primary pulmonary process and her outside CT angiography will be reviewed by radiology at the request of PCCM.  Concern for diffuse alveolar  hemorrhage or pulmonary AVMs though chest imaging thus far suggest a more focal process.  PCCM has ordered a low dose of Lasix in this Lasix nave patient.  If she does not respond to 20 mg of IV Lasix, would give a larger dose and monitor for response.  If concerned that she is in low output heart failure, could consider inotropes.  I remain somewhat concerned about the patient having hypovolemic shock from blood loss and will keep this in consideration with therapies offered to the patient.  Further laboratory testing for autoimmune etiologies of pulmonary hemorrhage are being pursued by pulmonary critical care, and repeat chest CT is also going to be performed soon.  Recommend involvement of advanced heart failure team at the request of PCCM in the morning.  Addendum: Most recent laboratory studies reviewed.  Elevated lactic acid and low CO2, suspect she is in low output failure as well.  Consider initiation of inotropes and close monitoring of urine output.  Would give Lasix 40 mg IV now in addition to the 20 mg given earlier. Communicated to PCCM.  CRITICAL CARE Performed by: Weston Brass, MD   Total critical care time: 60 minutes   Critical care time was exclusive of separately billable procedures and treating other patients.   Critical care was necessary to treat or prevent imminent or life-threatening deterioration.   Critical care was time spent personally by me (independent of APPs or residents) on the following activities: development of treatment plan with patient and/or surrogate as well as nursing, discussions with consultants, evaluation of patient's response to treatment, examination of patient, obtaining history from patient or surrogate, ordering and performing treatments and interventions, ordering and review of laboratory studies, ordering and review of radiographic studies, pulse oximetry and re-evaluation of patient's condition.   Length of Stay:  LOS:  0 days   Parke PoissonGayatri  A Demorris Choyce, MD [facility] 9:50 PM  02/06/2021

## 2021-02-06 NOTE — Progress Notes (Signed)
eLink Physician-Brief Progress Note Patient Name: Debra Daniel DOB: 1986/05/22 MRN: [id number]   Date of Service  02/06/2021  HPI/Events of Note  Patient with lactic acid level of 4.3, she has cardiogenic shock / low cardiac output state (EF 10 -15 %), she is being appropriately treated with Inotrope and diuresis.  eICU Interventions  Continue current RX, trend lactic acid to document improvement in response to Rx.        Thomasene Lot Zandria Woldt 02/06/2021, 10:26 PM

## 2021-02-07 ENCOUNTER — Inpatient Hospital Stay (HOSPITAL_COMMUNITY): Payer: Medicaid Other

## 2021-02-07 DIAGNOSIS — R570 Cardiogenic shock: Secondary | ICD-10-CM

## 2021-02-07 DIAGNOSIS — R609 Edema, unspecified: Secondary | ICD-10-CM

## 2021-02-07 LAB — URINALYSIS, ROUTINE W REFLEX MICROSCOPIC
Bilirubin Urine: NEGATIVE
Glucose, UA: NEGATIVE mg/dL
Ketones, ur: NEGATIVE mg/dL
Leukocytes,Ua: NEGATIVE
Nitrite: NEGATIVE
Protein, ur: NEGATIVE mg/dL
RBC / HPF: >50 RBC/hpf — ABNORMAL HIGH (ref 0–5)
Specific Gravity, Urine: 1.008 (ref 1.005–1.030)
pH: 5 (ref 5.0–8.0)

## 2021-02-07 LAB — RESPIRATORY PANEL BY PCR

## 2021-02-07 LAB — BASIC METABOLIC PANEL
Anion gap: 15 (ref 5–15)
Anion gap: 14 (ref 5–15)
BUN: 21 mg/dL — ABNORMAL HIGH (ref 6–20)
BUN: 18 mg/dL (ref 6–20)
CO2: 24 mmol/L (ref 22–32)
CO2: 24 mmol/L (ref 22–32)
Calcium: 8.1 mg/dL — ABNORMAL LOW (ref 8.9–10.3)
Calcium: 6.9 mg/dL — ABNORMAL LOW (ref 8.9–10.3)
Chloride: 97 mmol/L — ABNORMAL LOW (ref 98–111)
Chloride: 94 mmol/L — ABNORMAL LOW (ref 98–111)
Creatinine, Ser: 1.13 mg/dL — ABNORMAL HIGH (ref 0.44–1.00)
Creatinine, Ser: 1.05 mg/dL — ABNORMAL HIGH (ref 0.44–1.00)
GFR, Estimated: >60 mL/min (ref >60)
GFR, Estimated: >60 mL/min (ref >60)
Glucose, Bld: 113 mg/dL — ABNORMAL HIGH (ref 70–99)
Glucose, Bld: 319 mg/dL — ABNORMAL HIGH (ref 70–99)
Potassium: 3.4 mmol/L — ABNORMAL LOW (ref 3.5–5.1)
Potassium: 3.1 mmol/L — ABNORMAL LOW (ref 3.5–5.1)
Sodium: 136 mmol/L (ref 135–145)
Sodium: 132 mmol/L — ABNORMAL LOW (ref 135–145)

## 2021-02-07 LAB — TROPONIN I (HIGH SENSITIVITY): Troponin I (High Sensitivity): 24 ng/L — ABNORMAL HIGH (ref <18)

## 2021-02-07 LAB — COOXEMETRY PANEL
Carboxyhemoglobin: 1.6 % — ABNORMAL HIGH (ref 0.5–1.5)
Methemoglobin: 0.7 % (ref 0.0–1.5)
O2 Saturation: 63.1 %
Total hemoglobin: 11.2 g/dL — ABNORMAL LOW (ref 12.0–16.0)

## 2021-02-07 LAB — ABO/RH: ABO/RH(D): A POS

## 2021-02-07 LAB — CBC
HCT: 41.1 % (ref 36.0–46.0)
Hemoglobin: 13.5 g/dL (ref 12.0–15.0)
MCH: 26.7 pg (ref 26.0–34.0)
MCHC: 32.8 g/dL (ref 30.0–36.0)
MCV: 81.2 fL (ref 80.0–100.0)
Platelets: 150 10*3/uL (ref 150–400)
RBC: 5.06 MIL/uL (ref 3.87–5.11)
RDW: 16.4 % — ABNORMAL HIGH (ref 11.5–15.5)
WBC: 17 10*3/uL — ABNORMAL HIGH (ref 4.0–10.5)
nRBC: 0 % (ref 0.0–0.2)

## 2021-02-07 LAB — MAGNESIUM: Magnesium: 1.5 mg/dL — ABNORMAL LOW (ref 1.7–2.4)

## 2021-02-07 LAB — PROTIME-INR
INR: 1.8 — ABNORMAL HIGH (ref 0.8–1.2)
Prothrombin Time: 21.2 seconds — ABNORMAL HIGH (ref 11.4–15.2)

## 2021-02-07 LAB — C-REACTIVE PROTEIN: CRP: 23.5 mg/dL — ABNORMAL HIGH (ref <1.0)

## 2021-02-07 LAB — LACTIC ACID, PLASMA
Lactic Acid, Venous: 2.3 mmol/L (ref 0.5–1.9)
Lactic Acid, Venous: 2.2 mmol/L (ref 0.5–1.9)

## 2021-02-07 LAB — PROCALCITONIN: Procalcitonin: 2.54 ng/mL

## 2021-02-07 MED ORDER — VITAMIN K1 10 MG/ML IJ SOLN
10.0000 mg | Freq: Once | INTRAVENOUS | Status: AC
  Administered 2021-02-07: 10 mg via INTRAVENOUS
  Filled 2021-02-07: qty 1

## 2021-02-07 MED ORDER — AMIODARONE HCL IN DEXTROSE 360-4.14 MG/200ML-% IV SOLN
30.0000 mg/h | INTRAVENOUS | Status: DC
  Administered 2021-02-08 – 2021-02-13 (×13): 30 mg/h via INTRAVENOUS
  Filled 2021-02-07 (×4): qty 200
  Filled 2021-02-07: qty 400
  Filled 2021-02-07 (×5): qty 200

## 2021-02-07 MED ORDER — DOXYCYCLINE HYCLATE 100 MG PO TABS
100.0000 mg | ORAL_TABLET | Freq: Two times a day (BID) | ORAL | Status: DC
  Administered 2021-02-07: 100 mg via ORAL
  Filled 2021-02-07: qty 1

## 2021-02-07 MED ORDER — SODIUM CHLORIDE 0.9% FLUSH: 10.0000 mL | INTRAVENOUS | Status: DC | PRN

## 2021-02-07 MED ORDER — DIGOXIN 125 MCG PO TABS
0.1250 mg | ORAL_TABLET | Freq: Every day | ORAL | Status: DC
  Administered 2021-02-07: 0.125 mg via ORAL
  Filled 2021-02-07: qty 1

## 2021-02-07 MED ORDER — SODIUM CHLORIDE 0.9 % IV SOLN
250.0000 mL | INTRAVENOUS | Status: DC
  Administered 2021-02-07 – 2021-02-15 (×3): 250 mL via INTRAVENOUS

## 2021-02-07 MED ORDER — SODIUM CHLORIDE 0.9 % IV SOLN
100.0000 mg | Freq: Two times a day (BID) | INTRAVENOUS | Status: AC
  Administered 2021-02-08 – 2021-02-09 (×5): 100 mg via INTRAVENOUS
  Filled 2021-02-07 (×6): qty 100

## 2021-02-07 MED ORDER — SODIUM CHLORIDE 0.9 % IV SOLN: INTRAVENOUS | Status: DC | PRN

## 2021-02-07 MED ORDER — POTASSIUM CHLORIDE CRYS ER 20 MEQ PO TBCR
40.0000 meq | EXTENDED_RELEASE_TABLET | ORAL | Status: DC
  Filled 2021-02-07: qty 2

## 2021-02-07 MED ORDER — ONDANSETRON HCL 4 MG/2ML IJ SOLN
4.0000 mg | Freq: Four times a day (QID) | INTRAMUSCULAR | Status: DC | PRN
  Administered 2021-02-07 – 2021-02-08 (×2): 4 mg via INTRAVENOUS
  Filled 2021-02-07 (×2): qty 2

## 2021-02-07 MED ORDER — NOREPINEPHRINE 4 MG/250ML-% IV SOLN
2.0000 ug/min | INTRAVENOUS | Status: DC
  Administered 2021-02-07 – 2021-02-08 (×2): 2 ug/min via INTRAVENOUS
  Administered 2021-02-09: 4 ug/min via INTRAVENOUS
  Administered 2021-02-09: 5 ug/min via INTRAVENOUS
  Filled 2021-02-07 (×4): qty 250

## 2021-02-07 MED ORDER — DIGOXIN 0.25 MG/ML IJ SOLN
0.1250 mg | Freq: Every day | INTRAMUSCULAR | Status: DC
  Administered 2021-02-08: 0.125 mg via INTRAVENOUS
  Filled 2021-02-07: qty 2

## 2021-02-07 MED ORDER — POTASSIUM CHLORIDE 10 MEQ/100ML IV SOLN
10.0000 meq | INTRAVENOUS | Status: AC
  Administered 2021-02-07 (×4): 10 meq via INTRAVENOUS
  Filled 2021-02-07 (×4): qty 100

## 2021-02-07 MED ORDER — MAGNESIUM SULFATE 2 GM/50ML IV SOLN
2.0000 g | Freq: Once | INTRAVENOUS | Status: AC
  Administered 2021-02-07: 2 g via INTRAVENOUS
  Filled 2021-02-07: qty 50

## 2021-02-07 MED ORDER — PHYTONADIONE 5 MG PO TABS
10.0000 mg | ORAL_TABLET | Freq: Every day | ORAL | Status: DC
  Filled 2021-02-07: qty 2

## 2021-02-07 MED ORDER — AMIODARONE LOAD VIA INFUSION
150.0000 mg | Freq: Once | INTRAVENOUS | Status: AC
  Filled 2021-02-07: qty 83.34

## 2021-02-07 MED ORDER — FUROSEMIDE 10 MG/ML IJ SOLN
40.0000 mg | Freq: Once | INTRAMUSCULAR | Status: AC
  Administered 2021-02-07: 40 mg via INTRAVENOUS
  Filled 2021-02-07: qty 4

## 2021-02-07 MED ORDER — LIP MEDEX EX OINT
TOPICAL_OINTMENT | CUTANEOUS | Status: DC | PRN
  Filled 2021-02-07: qty 7

## 2021-02-07 MED ORDER — POTASSIUM CHLORIDE 20 MEQ PO PACK: 40.0000 meq | PACK | ORAL | Status: DC

## 2021-02-07 MED ORDER — POTASSIUM CHLORIDE 10 MEQ/50ML IV SOLN
10.0000 meq | INTRAVENOUS | Status: AC
  Administered 2021-02-07 – 2021-02-08 (×6): 10 meq via INTRAVENOUS
  Filled 2021-02-07 (×10): qty 50

## 2021-02-07 MED ORDER — AMIODARONE HCL IN DEXTROSE 360-4.14 MG/200ML-% IV SOLN
60.0000 mg/h | INTRAVENOUS | Status: DC
  Administered 2021-02-07 (×2): 60 mg/h via INTRAVENOUS
  Filled 2021-02-07: qty 200

## 2021-02-07 MED ORDER — SODIUM CHLORIDE 0.9 % IV SOLN
1.0000 g | INTRAVENOUS | Status: DC
  Administered 2021-02-07 – 2021-02-09 (×3): 1 g via INTRAVENOUS
  Filled 2021-02-07 (×4): qty 10

## 2021-02-07 MED ORDER — SODIUM CHLORIDE 0.9% FLUSH
10.0000 mL | Freq: Two times a day (BID) | INTRAVENOUS | Status: DC
  Administered 2021-02-07 – 2021-02-08 (×3): 10 mL
  Administered 2021-02-09: 40 mL
  Administered 2021-02-09 – 2021-02-12 (×6): 10 mL
  Administered 2021-02-13: 20 mL
  Administered 2021-02-14 – 2021-02-17 (×7): 10 mL
  Administered 2021-02-17: 40 mL
  Administered 2021-02-18 – 2021-02-20 (×4): 10 mL
  Administered 2021-02-20: 20 mL
  Administered 2021-02-21 (×2): 10 mL
  Administered 2021-02-22: 20 mL

## 2021-02-07 MED ORDER — AMIODARONE HCL IN DEXTROSE 360-4.14 MG/200ML-% IV SOLN
INTRAVENOUS | Status: AC
  Administered 2021-02-07: 150 mg via INTRAVENOUS
  Filled 2021-02-07: qty 200

## 2021-02-07 MED ORDER — SPIRONOLACTONE 12.5 MG HALF TABLET
12.5000 mg | ORAL_TABLET | Freq: Every day | ORAL | Status: DC
  Administered 2021-02-07 – 2021-02-14 (×8): 12.5 mg via ORAL
  Filled 2021-02-07 (×8): qty 1

## 2021-02-07 NOTE — Progress Notes (Signed)
Lower extremity venous has been completed.   Preliminary results in CV Proc.   Blanch Media 02/07/2021 11:47 AM

## 2021-02-07 NOTE — Progress Notes (Signed)
  Amiodarone Drug - Drug Interaction Consult Note  Recommendations:  Consider reducing dose of digoxin by 1/2 when starting amiodarone  Monitor QTc while on ondansetron  Monitor K while on spironolactone  Amiodarone is metabolized by the cytochrome P450 system and therefore has the potential to cause many drug interactions. Amiodarone has an average plasma half-life of 50 days (range 20 to 100 days).   There is potential for drug interactions to occur several weeks or months after stopping treatment and the onset of drug interactions may be slow after initiating amiodarone.   []  Statins: Increased risk of myopathy. Simvastatin- restrict dose to 20mg  daily. Other statins: counsel patients to report any muscle pain or weakness immediately.  []  Anticoagulants: Amiodarone can increase anticoagulant effect. Consider warfarin dose reduction. Patients should be monitored closely and the dose of anticoagulant altered accordingly, remembering that amiodarone levels take several weeks to stabilize.  []  Antiepileptics: Amiodarone can increase plasma concentration of phenytoin, the dose should be reduced. Note that small changes in phenytoin dose can result in large changes in levels. Monitor patient and counsel on signs of toxicity.  []  Beta blockers: increased risk of bradycardia, AV block and myocardial depression. Sotalol - avoid concomitant use.  []   Calcium channel blockers (diltiazem and verapamil): increased risk of bradycardia, AV block and myocardial depression.  []   Cyclosporine: Amiodarone increases levels of cyclosporine. Reduced dose of cyclosporine is recommended.  [x]  Digoxin dose should be halved when amiodarone is started.  [x]  Diuretics: increased risk of cardiotoxicity if hypokalemia occurs.  []  Oral hypoglycemic agents (glyburide, glipizide, glimepiride): increased risk of hypoglycemia. Patient's glucose levels should be monitored closely when initiating amiodarone therapy.    [x]  Drugs that prolong the QT interval:  Torsades de pointes risk may be increased with concurrent use - avoid if possible.  Monitor QTc, also keep magnesium/potassium WNL if concurrent therapy can't be avoided.  Antibiotics: e.g. fluoroquinolones, erythromycin.  Antiarrhythmics: e.g. quinidine, procainamide, disopyramide, sotalol.  Antipsychotics: e.g. phenothiazines, haloperidol.   Lithium, tricyclic antidepressants, and methadone.  Ondansetron  , PharmD, BCPS, [facility] Clinical Pharmacist 02/07/2021 7:29 PM

## 2021-02-07 NOTE — Progress Notes (Signed)
Peripherally Inserted Central Catheter Placement  The IV Nurse has discussed with the patient and/or persons authorized to consent for the patient, the purpose of this procedure and the potential benefits and risks involved with this procedure.  The benefits include less needle sticks, lab draws from the catheter, and the patient may be discharged home with the catheter. Risks include, but not limited to, infection, bleeding, blood clot (thrombus formation), and puncture of an artery; nerve damage and irregular heartbeat and possibility to perform a PICC exchange if needed/ordered by physician.  Alternatives to this procedure were also discussed.  Bard Power PICC patient education guide, fact sheet on infection prevention and patient information card has been provided to patient /or left at bedside.    PICC Placement Documentation  PICC Triple Lumen 02/07/21 PICC Right Basilic 38 cm 1 cm (Active)  Indication for Insertion or Continuance of Line Vasoactive infusions 02/07/21 0902  Exposed Catheter (cm) 0 cm 02/07/21 0902  Site Assessment Clean;Dry;Intact 02/07/21 0902  Lumen #1 Status Flushed;Blood return noted;Saline locked 02/07/21 0902  Lumen #2 Status Flushed;Blood return noted;Saline locked 02/07/21 0902  Lumen #3 Status Flushed;Blood return noted;Saline locked 02/07/21 0902  Dressing Type Transparent 02/07/21 0902  Dressing Status Clean;Dry;Intact 02/07/21 0902  Antimicrobial disc in place? Yes 02/07/21 0902  Dressing Change Due 02/14/21 02/07/21 0902       Audrie Gallus 02/07/2021, 9:04 AM

## 2021-02-07 NOTE — Progress Notes (Addendum)
NAME:  Willer Osorno, MRN:  [id number], DOB:  [birth year], LOS: 1 ADMISSION DATE:  02/06/2021, CONSULTATION DATE:  6/28 REFERRING MD:  Dr. Adela Glimpse, CHIEF COMPLAINT:  hemoptysis   History of Present Illness:  35 year old female with no significant past medical history up until quite recently. Three months prior to admission she was diagnosed with hypothyroid and was started on synthroid. Then, approximately 3 months prior to presentation she developed UTI symptoms. She is unclear what medications she was treated with, although she remembers the first one was ineffective and a second antibiotic was needed. Progressive lower extremity edema since that time as well. Then 6/25 she began to experience mild hemoptysis, which prompted her to present to [facility] 6/27. Workup in the ED included CTA chest and CT abdomen/pelvis. She was found to have right sided segmental PE as well a crazy paving pattern on the CT chest. CT abdomen was non-acute. She was admitted. Started on heparin for PE, which caused her hemoptysis to become much worse. Heparin was stopped. Echocardiogram done demonstrated LVEF 25-30% as well as mod-severe mitral regurgitation. She was transferred to [facility] for further evaluation.   Pertinent  Medical History   has a past medical history of Encounter for menstrual regulation (01/24/2015), Hypothyroid, Migraines, Obesity, and UTI (urinary tract infection).    has a past medical history of Encounter for menstrual regulation (01/24/2015), Hypothyroid, Migraines, Obesity, and UTI (urinary tract infection).   reports that she has never smoked. She has never used smokeless tobacco.  Past Surgical History:  Procedure Laterality Date   broken collar bone  1994    Allergies  Allergen Reactions   Sulfa Antibiotics Hives   Ibuprofen Other (See Comments)    Constipation Can tolerate w milk of mag    Immunization History  Administered Date(s) Administered   Moderna  Sars-Covid-2 Vaccination 12/06/2019, 01/05/2020    Family History  Problem Relation Age of Onset   Hypertension Paternal Grandmother    Coronary artery disease Paternal Grandmother    Hypertension Maternal Grandmother    Diabetes Maternal Grandmother    Hyperlipidemia Maternal Grandmother    Thyroid disease Maternal Grandmother    Kidney disease Maternal Grandmother    Diabetes Father    Hypertension Mother    Hyperlipidemia Mother    COPD Paternal Grandfather    Heart disease Maternal Grandfather    Diabetes Maternal Grandfather    Hypertension Brother    Hyperlipidemia Brother      Current Facility-Administered Medications:    0.9 %  sodium chloride infusion, , Intravenous, PRN, Kalman Shan, MD, Last Rate: 10 mL/hr at 02/07/21 0949, 250 mL at 02/07/21 0949   0.9 %  sodium chloride infusion, 250 mL, Intravenous, Continuous, Ogan, Okoronkwo U, MD   0.9 %  sodium chloride infusion, , Intravenous, PRN, Jamy Whyte, Lesia Sago, MD, Last Rate: 10 mL/hr at 02/07/21 0951, New Bag at 02/07/21 0951   cefTRIAXone (ROCEPHIN) 1 g in sodium chloride 0.9 % 100 mL IVPB, 1 g, Intravenous, Q24H, Trygg Mantz, Lesia Sago, MD, Last Rate: 200 mL/hr at 02/07/21 0944, 1 g at 02/07/21 0944   Chlorhexidine Gluconate Cloth 2 % PADS 6 each, 6 each, Topical, Daily, Kalman Shan, MD, 6 each at 02/07/21 0955   doxycycline (VIBRA-TABS) tablet 100 mg, 100 mg, Oral, Q12H, Ronen Bromwell, Lesia Sago, MD, 100 mg at 02/07/21 0932   lip balm (CARMEX) ointment, , Topical, PRN, Ogan, Okoronkwo U, MD   milrinone (PRIMACOR) 20 MG/100 ML (0.2 mg/mL) infusion, 0.25  mcg/kg/min, Intravenous, Continuous, Duayne Cal, NP, Last Rate: 7.87 mL/hr at 02/07/21 0900, 0.25 mcg/kg/min at 02/07/21 0900   norepinephrine (LEVOPHED) 4mg  in premix infusion, 2-10 mcg/min, Intravenous, Titrated, Ogan, Okoronkwo U, MD, Last Rate: 7.5 mL/hr at 02/07/21 0900, 2 mcg/min at 02/07/21 0900   ondansetron (ZOFRAN) injection 4 mg, 4 mg,  Intravenous, Q6H PRN, Tamika Nou, Lesia Sago, MD, 4 mg at 02/07/21 1050   phytonadione (VITAMIN K) 10 mg in dextrose 5 % 50 mL IVPB, 10 mg, Intravenous, Once, Jadon Harbaugh, Lesia Sago, MD   sodium chloride flush (NS) 0.9 % injection 10-40 mL, 10-40 mL, Intracatheter, Q12H, Damonie Ellenwood, Lesia Sago, MD   sodium chloride flush (NS) 0.9 % injection 10-40 mL, 10-40 mL, Intracatheter, PRN, Zyia Kaneko, Lesia Sago, MD   Significant Hospital Events: Including procedures, antibiotic start and stop dates in addition to other pertinent events   6/27 admit to [place] for hemotpysis. Dx PE. Worsening hemoptysis  6/28 tx to [facility] > then to ICU due to hypotension, hemoptysis.  6/29 milrinone, NE, UOP picking up, PICC placed  Interim History / Subjective:  NAEON. Still coughing blood. Tried to tease out timeline of events. Largely based on pharmacy records. Diagnosed with UTI 12/23/20. ED note says given fosfamycin. Per patient only symptoms was decreased UOP. Denied dysuria, belly pain. Did have N/V. UA at that time Hgb, RBCs, +leuks, neg nitrites, >50 WBCs.  Noticed LE swelling around this time or slightly later. Treated as OP for UTI with macrobid but stopped after a few days and switched to Augmentin. Saw PCP for LE swelling and diagnosed with hypothyroid first week or  so 01/2021. She denies any breathing issues. No orthopnea or PND.  Nausea and vomiting preceded junky cough. Cough x 3 weeks, hemoptysis last few days.  Way I put it together - subacute low flow state due to CHF yielded GI symptoms, UTI may be true or red herring (UA dirty, no culture). N/V preceded cough 3 weeks ago. Cough ever since, junky sputum, recent development hemoptysis. Led to presentation and discovery of BiV failure, subacute nature fits a bit with relative compensation.  Objective   Blood pressure 112/89, pulse (!) 128, temperature 97.9 F (36.6 C), temperature source Oral, resp. rate 14, height 5' 4.5" (1.638 m), weight 103.6 kg, last  menstrual period 10/24/2020, SpO2 95 %.        Intake/Output Summary (Last 24 hours) at 02/07/2021 1151 Last data filed at 02/07/2021 0951 Gross per 24 hour  Intake 759 ml  Output 2125 ml  Net -1366 ml    Filed Weights   02/06/21 1812 02/06/21 2015 02/07/21 0630  Weight: 103 kg 104.9 kg 103.6 kg    Examination: General: Overweight female in NAD HENT: [place]/AT, PERRL, no JVD Lungs: Coarse bases, no distress. Sats in the high 90s on room air.  Cardiovascular: Tachy, regular, no MRG. 3+ pitting edema in bilateral lower extremities to the knees.  Abdomen: Soft, non-tender, non-distended. Extremities: Edema as above, no acute deformity or ROM limitations.  Neuro: Alert, oriented, non-focal.    LABS    PULMONARY No results for input(s): PHART, PCO2ART, PO2ART, HCO3, TCO2, O2SAT in the last 168 hours.  Invalid input(s): PCO2, PO2  CBC Recent Labs  Lab 02/06/21 2008 02/06/21 2018 02/07/21 0226  HGB 12.6  --  13.5  HCT 38.3  --  41.1  WBC 17.5*  --  17.0*  PLT 156 172 150     COAGULATION Recent Labs  Lab 02/06/21 2008 02/06/21 2018  INR 1.9* 1.9*     CARDIAC  No results for input(s): TROPONINI in the last 168 hours. No results for input(s): PROBNP in the last 168 hours.   CHEMISTRY Recent Labs  Lab 02/06/21 2008 02/07/21 0226  NA 131* 136  K 4.0 3.4*  CL 96* 97*  CO2 18* 24  GLUCOSE 135* 113*  BUN 21* 21*  CREATININE 1.13* 1.13*  CALCIUM 7.9* 8.1*  MG 1.6*  --     Estimated Creatinine Clearance: 83.8 mL/min (A) (by C-G formula based on SCr of 1.13 mg/dL (H)).   LIVER Recent Labs  Lab 02/06/21 2008 02/06/21 2018  AST 39  --   ALT 55*  --   ALKPHOS 61  --   BILITOT 3.5*  --   PROT 5.2*  --   ALBUMIN 2.4*  --   INR 1.9* 1.9*      INFECTIOUS Recent Labs  Lab 02/06/21 1919 02/06/21 2017 02/07/21 0226 02/07/21 0542  LATICACIDVEN  --  4.3*  --  2.3*  PROCALCITON 2.31  --  2.54  --       ENDOCRINE CBG (last 3)  Recent Labs     02/06/21 2022  GLUCAP 134*          IMAGING x48h  - image(s) personally visualized  -   highlighted in bold CT CHEST WO CONTRAST  Result Date: 02/06/2021 CLINICAL DATA:  Hemoptysis EXAM: CT CHEST WITHOUT CONTRAST TECHNIQUE: Multidetector CT imaging of the chest was performed following the standard protocol without IV contrast. COMPARISON:  None. FINDINGS: Cardiovascular: Heart is normal size. Aorta is normal caliber. Mediastinum/Nodes: Soft tissue in the anterior mediastinum felt represent residual thymus. No mediastinal, hilar, or axillary adenopathy. Trachea and esophagus are unremarkable. Thyroid unremarkable. Lungs/Pleura: Dense consolidation in the right lower lobe. Ground-glass airspace opacities in the right middle lobe. Consolidation also noted in the left lower lobe. Findings compatible with pneumonia. No effusions. Upper Abdomen: Imaging into the upper abdomen demonstrates no acute findings. Musculoskeletal: Chest wall soft tissues are unremarkable. No acute bony abnormality. IMPRESSION: Areas of consolidation in the right lower lobe, left lower lobe and right middle lobe compatible with multifocal pneumonia. Electronically Signed   By: Charlett Nose M.D.   On: 02/06/2021 22:35   DG CHEST PORT 1 VIEW  Result Date: 02/06/2021 CLINICAL DATA:  35 year old female with hemoptysis. EXAM: PORTABLE CHEST 1 VIEW COMPARISON:  None. FINDINGS: Focal right lung base opacity may represent atelectasis but concerning for pneumonia. Clinical correlation is recommended. The left lung is clear. No pleural effusion pneumothorax. There is mild cardiomegaly. No acute osseous pathology. IMPRESSION: Right lung base opacity concerning for pneumonia. Electronically Signed   By: Elgie Collard M.D.   On: 02/06/2021 19:44   VAS [place] LOWER EXTREMITY VENOUS (DVT)  Result Date: 02/07/2021  Lower Venous DVT Study Patient Name:  CORRENE LALANI  Date of Exam:   02/07/2021 Medical Rec #: [id number]      Accession #:     [id number] Date of Birth: [birth year]      Patient Gender: F Patient Age:   30Y Exam Location:  [facility] Procedure:      VAS [place] LOWER EXTREMITY VENOUS (DVT) Referring Phys: [id number] MURALI RAMASWAMY --------------------------------------------------------------------------------  Indications: Edema.  Limitations: Body habitus. Comparison Study: no prior Performing Technologist: [place] Ponder RVS  Examination Guidelines: A complete evaluation includes B-mode imaging, spectral Doppler, color Doppler, and power Doppler as needed of all accessible portions of each vessel. Bilateral testing is considered an  integral part of a complete examination. Limited examinations for reoccurring indications may be performed as noted. The reflux portion of the exam is performed with the patient in reverse Trendelenburg.  +---------+---------------+---------+-----------+----------+-------------------+ RIGHT    CompressibilityPhasicitySpontaneityPropertiesThrombus Aging      +---------+---------------+---------+-----------+----------+-------------------+ CFV      Full           Yes      Yes                                      +---------+---------------+---------+-----------+----------+-------------------+ SFJ      Full                                                             +---------+---------------+---------+-----------+----------+-------------------+ FV Prox  Full                                                             +---------+---------------+---------+-----------+----------+-------------------+ FV Mid   Full                                                             +---------+---------------+---------+-----------+----------+-------------------+ FV Distal               Yes      Yes                                      +---------+---------------+---------+-----------+----------+-------------------+ PFV      Full                                                              +---------+---------------+---------+-----------+----------+-------------------+ POP      Full           Yes      Yes                                      +---------+---------------+---------+-----------+----------+-------------------+ PTV      Full                                                             +---------+---------------+---------+-----------+----------+-------------------+ PERO  Not well visualized +---------+---------------+---------+-----------+----------+-------------------+   +---------+---------------+---------+-----------+----------+-------------------+ LEFT     CompressibilityPhasicitySpontaneityPropertiesThrombus Aging      +---------+---------------+---------+-----------+----------+-------------------+ CFV      Full           Yes      Yes                                      +---------+---------------+---------+-----------+----------+-------------------+ SFJ      Full                                                             +---------+---------------+---------+-----------+----------+-------------------+ FV Prox  Full                                                             +---------+---------------+---------+-----------+----------+-------------------+ FV Mid                  Yes      Yes                                      +---------+---------------+---------+-----------+----------+-------------------+ FV Distal               Yes      Yes                                      +---------+---------------+---------+-----------+----------+-------------------+ PFV      Full                                                             +---------+---------------+---------+-----------+----------+-------------------+ POP      Full           Yes      Yes                                       +---------+---------------+---------+-----------+----------+-------------------+ PTV      Full                                                             +---------+---------------+---------+-----------+----------+-------------------+ PERO                                                  Not well visualized +---------+---------------+---------+-----------+----------+-------------------+    Summary: BILATERAL: - No  evidence of deep vein thrombosis seen in the lower extremities, bilaterally. -No evidence of popliteal cyst, bilaterally.   *See table(s) above for measurements and observations.    Preliminary    ECHOCARDIOGRAM LIMITED  Result Date: 02/06/2021    ECHOCARDIOGRAM REPORT   Patient Name:   ZALEY TALLEY Date of Exam: 02/06/2021 Medical Rec #:  [id number]     Height:       64.5 in Accession #:    [id number]    Weight:       227.1 lb Date of Birth:  [birth year]     BSA:          2.076 m Patient Age:    35 years      BP:           109/88 mmHg Patient Gender: F             HR:           132 bpm. Exam Location:  Inpatient Procedure: Limited Echo, Limited Color Doppler and Cardiac Doppler STAT ECHO Indications:    Cardiomyopathy  History:        Patient has prior history of Echocardiogram examinations. CHF;                 Signs/Symptoms:Hemoptysis.  Sonographer:    Delcie Roch Referring Phys: [id number] MURALI RAMASWAMY IMPRESSIONS  1. Left ventricular ejection fraction, by estimation, is 10-15%. The left ventricle has severely decreased function. The left ventricle demonstrates global hypokinesis. The left ventricular internal cavity size was moderately dilated. Left ventricular diastolic function could not be evaluated.  2. Right ventricular systolic function is moderate-severely reduced. The right ventricular size is moderately enlarged. There is moderately elevated pulmonary artery systolic pressure. The estimated right ventricular systolic pressure is 45.9 mmHg.  3. Left atrial size was mildly  dilated.  4. The mitral valve is grossly normal. Severe, secondary mitral valve regurgitation with leaflet tenting secondary to LV dysfunction. No evidence of mitral stenosis.  5. Tricuspid valve regurgitation is severe.  6. The aortic valve is normal in structure. Aortic valve regurgitation is not visualized.  7. The inferior vena cava is normal in size with <50% respiratory variability, suggesting right atrial pressure of 8 mmHg. FINDINGS  Left Ventricle: Left ventricular ejection fraction, by estimation, is 10-15%. The left ventricle has severely decreased function. The left ventricle demonstrates global hypokinesis. The left ventricular internal cavity size was moderately dilated. There  is no left ventricular hypertrophy. Left ventricular diastolic function could not be evaluated. Right Ventricle: The right ventricular size is moderately enlarged. No increase in right ventricular wall thickness. Right ventricular systolic function is moderate-severely reduced. There is moderately elevated pulmonary artery systolic pressure. The tricuspid regurgitant velocity is 3.08 m/s, and with an assumed right atrial pressure of 8 mmHg, the estimated right ventricular systolic pressure is 45.9 mmHg. Left Atrium: Left atrial size was mildly dilated. Right Atrium: Right atrial size was normal in size. Pericardium: There is no evidence of pericardial effusion. Mitral Valve: The mitral valve is grossly normal. Severe mitral valve regurgitation. No evidence of mitral valve stenosis. MV peak gradient, 85.0 mmHg. Tricuspid Valve: The tricuspid valve is grossly normal. Tricuspid valve regurgitation is severe. Aortic Valve: The aortic valve is normal in structure. Aortic valve regurgitation is not visualized. Pulmonic Valve: The pulmonic valve was not well visualized. Aorta: The aortic root is normal in size and structure. Pulmonary Artery: The pulmonary artery is of normal size. Venous: The inferior vena cava is normal  in size with  less than 50% respiratory variability, suggesting right atrial pressure of 8 mmHg. IAS/Shunts: No atrial level shunt detected by color flow Doppler.  LEFT VENTRICLE PLAX 2D LVIDd:         6.00 cm  Diastology LVIDs:         5.40 cm  LV e' medial:  6.85 cm/s LV PW:         1.10 cm  LV e' lateral: 12.90 cm/s LV IVS:        0.90 cm LVOT diam:     2.00 cm LV SV:         18 LV SV Index:   9 LVOT Area:     3.14 cm  IVC IVC diam: 2.20 cm LEFT ATRIUM           Index LA diam:      5.00 cm 2.41 cm/m LA Vol (A4C): 79.3 ml 38.20 ml/m  AORTIC VALVE LVOT Vmax:   48.40 cm/s LVOT Vmean:  31.300 cm/s LVOT VTI:    0.058 m  AORTA Ao Asc diam: 2.30 cm MITRAL VALVE                 TRICUSPID VALVE MV Peak grad: 85.0 mmHg      TR Peak grad:   37.9 mmHg MV Vmax:      4.61 m/s       TR Vmax:        308.00 cm/s MR Peak grad:    91.4 mmHg MR Mean grad:    57.0 mmHg   SHUNTS MR Vmax:         478.00 cm/s Systemic VTI:  0.06 m MR Vmean:        365.0 cm/s  Systemic Diam: 2.00 cm MR PISA:         1.27 cm MR PISA Eff ROA: 11 mm MR PISA Radius:  0.45 cm Weston Brass MD Electronically signed by Weston Brass MD Signature Date/Time: 02/06/2021/9:39:14 PM    Final    [place] EKG SITE RITE  Result Date: 02/06/2021 If Site Rite image not attached, placement could not be confirmed due to current cardiac rhythm.    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 Severely reduced RV systolic function. Severe secondary MR. BNP 1500. (POA) Core is warm, distal extremities cool. Lactate 4 to 2 with initial interventions. Etiolgy is unclear - drug related but abx not associated with CM and literature search reveals a paucity of information. Viral? Does not give history of one. - Cardiology following, recommending trial of diuresis and and inotrope, appreciate assistance - UOP better after increase dose  lasix 40 mg IV, re-dose this AM - Milrinone 0.25 mg/hr, NE low dose to support BP - Co-ox now that PICC in place, trend and assess response to medications  Hemoptysis (POA): Suspect related to pulmonary infiltrates and coagulopathy with likely contribution of elevated PV pressures in setting of volume overload and severe MR. Possible related to PE seen at OSH ([facility]). Images with alveolar filling on right, unclear if DAH vs aspirated blood from other cause of hemoptysis. Cardiogenic shock, not stable for diagnostic bronchoscopy. - Autoimmune, Vasculitis workup from 6/28  pending - Quant Gold 6/28 - check Urine strep, legionella, RVP, MRSA PCR - urine pregnancy - check UDS - Correct INR - Consider empiric steroids if respiratory status worsens, favor holding  off in case she can tolerate diagnostic procedure in the future  Pulmonary emobli per report from outside hospital [facility]): segmental and subsegmental PE  - Cannot anticoagulate at this time due to hemoptysis +trial of systemic heparin made things worse at OSH -correct coagulopathy - LE dopplers, consider filter  AKI (POA)  - monitor with cardiac Rx  Congestive Hepatopathy: POA. LFTs, bili elevated. INR 1.9. --HF therapies as above  Elevated INR: POA. suspect related to congestive hepatopathy but nutritional deficiency considered. --PO Vit K x 3 days  Elevated anion gap metabolic lactic acidosis: POA. lactic acid 4.5 - present on admission --due to cardiogenic shock  Hypomagnesemia POA - Supp mag - Repeat BMP  - Repeat mag in AM  Hypothyroidism: POA. TSH 13 and t4 mildly elevated as well on admission. P - Hold home synthroid for now.  Acute hypoxemic resp failure -POA.  present on admission due to above. Suspect due to pulmonary infiltrates. On 2L [place].  -goal pulse ox > 88%  CAP: POA. dense bilateral LL and RML infiltrates. MRSA swab negative. --CTX/doxy x 5 days  GGOs: POA. On CT. Suspect aspirated blood. DAH possible.  Denser infiltrates not typical for vasculitis. Sed rate normal. --CRP --work up as above for hemoptysis  Microscopic Hematuria: Presumed due to coagulopathy. Vasculitis possible. --Repeat UA once INR corrected  Best Practice (right click and "Reselect all SmartList Selections" daily)   Diet/type: NPO and NPO w/ oral meds DVT prophylaxis: SCD GI prophylaxis: PPI Lines: N/A, Central line, and yes and it is still needed Foley:  Yes, and it is still needed Code Status:  full code Last date of multidisciplinary goals of care discussion [ 6/28]  CRITICAL CARE Performed by: Karren Burly   Total critical care time: 60 minutes  Critical care time was exclusive of separately billable procedures and treating other patients.  Critical care was necessary to treat or prevent imminent or life-threatening deterioration.  Critical care was time spent personally by me on the following activities: development of treatment plan with patient and/or surrogate as well as nursing, discussions with consultants, evaluation of patient's response to treatment, examination of patient, obtaining history from patient or surrogate, ordering and performing treatments and interventions, ordering and review of laboratory studies, ordering and review of radiographic studies, pulse oximetry and re-evaluation of patient's condition.

## 2021-02-07 NOTE — Progress Notes (Signed)
eLink Physician-Brief Progress Note Patient Name: Debra Daniel DOB: 10/23/1985 MRN: [id number]   Date of Service  02/07/2021  HPI/Events of Note  Patient needs medications switched to iv secondary to nausea / vomiting.  eICU Interventions  Digoxin, KCL and Doxycycline switched to iv route.        Thomasene Lot Abrahim Sargent 02/07/2021, 9:17 PM

## 2021-02-07 NOTE — Progress Notes (Signed)
   Patient developed NSVT (22 beats) + several runs shorter NSVT. Check electrolytes. Add amio.   Arvilla Meres, MD  5:34 PM

## 2021-02-07 NOTE — Progress Notes (Addendum)
eLink Physician-Brief Progress Note Patient Name: Debra Daniel DOB: 06/27/1986 MRN: [id number]   Date of Service  02/07/2021  HPI/Events of Note  Hypotension on Milrinone although is has clearly improved her forward cardiac output and stimulated a diuresis, K+ 3.4 with some ectopy.  eICU Interventions  Will continue Milrinone and add 2 - 10 mcg of Norepinephrine  to maintain MAP > 60 mmHg, will replete K+ aggressively.        Thomasene Lot Jarmal Lewelling 02/07/2021, 5:26 AM

## 2021-02-07 NOTE — H&P (Deleted)
NAME:  Debra Daniel, MRN:  [id number], DOB:  1986/06/06, LOS: 1 ADMISSION DATE:  02/06/2021, CONSULTATION DATE:  6/28 REFERRING MD:  Dr. Adela Glimpse, CHIEF COMPLAINT:  hemoptysis   History of Present Illness:  35 year old female with no significant past medical history up until quite recently. Three months prior to admission she was diagnosed with hypothyroid and was started on synthroid. Then, approximately 3 months prior to presentation she developed UTI symptoms. She is unclear what medications she was treated with, although she remembers the first one was ineffective and a second antibiotic was needed. Progressive lower extremity edema since that time as well. Then 6/25 she began to experience mild hemoptysis, which prompted her to present to [facility] 6/27. Workup in the ED included CTA chest and CT abdomen/pelvis. She was found to have right sided segmental PE as well a crazy paving pattern on the CT chest. CT abdomen was non-acute. She was admitted. Started on heparin for PE, which caused her hemoptysis to become much worse. Heparin was stopped. Echocardiogram done demonstrated LVEF 25-30% as well as mod-severe mitral regurgitation. She was transferred to [facility] for further evaluation.   Pertinent  Medical History   has a past medical history of Encounter for menstrual regulation (01/24/2015), Hypothyroid, Migraines, Obesity, and UTI (urinary tract infection).    has a past medical history of Encounter for menstrual regulation (01/24/2015), Hypothyroid, Migraines, Obesity, and UTI (urinary tract infection).   reports that she has never smoked. She has never used smokeless tobacco.  Past Surgical History:  Procedure Laterality Date   broken collar bone  1994    Allergies  Allergen Reactions   Elemental Sulfur Rash    Immunization History  Administered Date(s) Administered   Moderna Sars-Covid-2 Vaccination 12/06/2019, 01/05/2020    Family History  Problem  Relation Age of Onset   Hypertension Paternal Grandmother    Coronary artery disease Paternal Grandmother    Hypertension Maternal Grandmother    Diabetes Maternal Grandmother    Hyperlipidemia Maternal Grandmother    Thyroid disease Maternal Grandmother    Kidney disease Maternal Grandmother    Diabetes Father    Hypertension Mother    Hyperlipidemia Mother    COPD Paternal Grandfather    Heart disease Maternal Grandfather    Diabetes Maternal Grandfather    Hypertension Brother    Hyperlipidemia Brother      Current Facility-Administered Medications:    0.9 %  sodium chloride infusion, , Intravenous, PRN, Kalman Shan, MD, Last Rate: 10 mL/hr at 02/07/21 0949, 250 mL at 02/07/21 0949   0.9 %  sodium chloride infusion, 250 mL, Intravenous, Continuous, Ogan, Okoronkwo U, MD   0.9 %  sodium chloride infusion, , Intravenous, PRN, Aarthi Uyeno, Lesia Sago, MD, Last Rate: 10 mL/hr at 02/07/21 0951, New Bag at 02/07/21 0951   cefTRIAXone (ROCEPHIN) 1 g in sodium chloride 0.9 % 100 mL IVPB, 1 g, Intravenous, Q24H, Fara Worthy, Lesia Sago, MD, Last Rate: 200 mL/hr at 02/07/21 0944, 1 g at 02/07/21 0944   Chlorhexidine Gluconate Cloth 2 % PADS 6 each, 6 each, Topical, Daily, Kalman Shan, MD, 6 each at 02/07/21 0955   doxycycline (VIBRA-TABS) tablet 100 mg, 100 mg, Oral, Q12H, Sianne Tejada, Lesia Sago, MD, 100 mg at 02/07/21 0932   lip balm (CARMEX) ointment, , Topical, PRN, Ogan, Okoronkwo U, MD   milrinone (PRIMACOR) 20 MG/100 ML (0.2 mg/mL) infusion, 0.25 mcg/kg/min, Intravenous, Continuous, Duayne Cal, NP, Last Rate: 7.87 mL/hr at 02/07/21 0900, 0.25 mcg/kg/min  at 02/07/21 0900   norepinephrine (LEVOPHED) 4mg  in premix infusion, 2-10 mcg/min, Intravenous, Titrated, Ogan, Okoronkwo U, MD, Last Rate: 7.5 mL/hr at 02/07/21 0900, 2 mcg/min at 02/07/21 0900   phytonadione (VITAMIN K) tablet 10 mg, 10 mg, Oral, Daily, Ramaswamy, Murali, MD   potassium chloride 10 mEq in 100 mL IVPB, 10  mEq, Intravenous, Q1 Hr x 4, Ogan, Okoronkwo U, MD, Last Rate: 100 mL/hr at 02/07/21 0924, 10 mEq at 02/07/21 0924   sodium chloride flush (NS) 0.9 % injection 10-40 mL, 10-40 mL, Intracatheter, Q12H, Adan Beal, 02-01-1997, MD   sodium chloride flush (NS) 0.9 % injection 10-40 mL, 10-40 mL, Intracatheter, PRN, Brodyn Depuy, 02-01-1997, MD   Significant Hospital Events: Including procedures, antibiotic start and stop dates in addition to other pertinent events   6/27 admit to [place] for hemotpysis. Dx PE. Worsening hemoptysis  6/28 tx to [facility] > then to ICU due to hypotension, hemoptysis.  6/29 milrinone, NE, UOP picking up, PICC placed  Interim History / Subjective:  NAEON. Still coughing blood. Tried to tease out timeline of events. Largely based on pharmacy records. Diagnosed with UTI 12/23/20. ED note says given fosfamycin. Per patient only symptoms was decreased UOP. Denied dysuria, belly pain. Did have N/V. UA at that time Hgb, RBCs, +leuks, neg nitrites, >50 WBCs.  Noticed LE swelling around this time or slightly later. Treated as OP for UTI with macrobid but stopped after a few days and switched to Augmentin. Saw PCP for LE swelling and diagnosed with hypothyroid first week or  so 01/2021. She denies any breathing issues. No orthopnea or PND.   Objective   Blood pressure 112/89, pulse (!) 128, temperature 98.1 F (36.7 C), temperature source Oral, resp. rate 14, height 5' 4.5" (1.638 m), weight 103.6 kg, last menstrual period 10/24/2020, SpO2 95 %.        Intake/Output Summary (Last 24 hours) at 02/07/2021 1010 Last data filed at 02/07/2021 0951 Gross per 24 hour  Intake 759 ml  Output 2125 ml  Net -1366 ml   Filed Weights   02/06/21 1812 02/06/21 2015 02/07/21 0630  Weight: 103 kg 104.9 kg 103.6 kg    Examination: General: Overweight female in NAD HENT: [place]/AT, PERRL, no JVD Lungs: Coarse bases, no distress. Sats in the high 90s on room air.  Cardiovascular: Tachy, regular, no MRG.  3+ pitting edema in bilateral lower extremities to the knees.  Abdomen: Soft, non-tender, non-distended. Extremities: Edema as above, no acute deformity or ROM limitations.  Neuro: Alert, oriented, non-focal.    LABS    PULMONARY No results for input(s): PHART, PCO2ART, PO2ART, HCO3, TCO2, O2SAT in the last 168 hours.  Invalid input(s): PCO2, PO2  CBC Recent Labs  Lab 02/06/21 2008 02/06/21 2018 02/07/21 0226  HGB 12.6  --  13.5  HCT 38.3  --  41.1  WBC 17.5*  --  17.0*  PLT 156 172 150     COAGULATION Recent Labs  Lab 02/06/21 2008 02/06/21 2018  INR 1.9* 1.9*     CARDIAC  No results for input(s): TROPONINI in the last 168 hours. No results for input(s): PROBNP in the last 168 hours.   CHEMISTRY Recent Labs  Lab 02/06/21 2008 02/07/21 0226  NA 131* 136  K 4.0 3.4*  CL 96* 97*  CO2 18* 24  GLUCOSE 135* 113*  BUN 21* 21*  CREATININE 1.13* 1.13*  CALCIUM 7.9* 8.1*  MG 1.6*  --     Estimated Creatinine Clearance:  83.8 mL/min (A) (by C-G formula based on SCr of 1.13 mg/dL (H)).   LIVER Recent Labs  Lab 02/06/21 2008 02/06/21 2018  AST 39  --   ALT 55*  --   ALKPHOS 61  --   BILITOT 3.5*  --   PROT 5.2*  --   ALBUMIN 2.4*  --   INR 1.9* 1.9*      INFECTIOUS Recent Labs  Lab 02/06/21 1919 02/06/21 2017 02/07/21 0226 02/07/21 0542  LATICACIDVEN  --  4.3*  --  2.3*  PROCALCITON 2.31  --  2.54  --       ENDOCRINE CBG (last 3)  Recent Labs    02/06/21 2022  GLUCAP 134*          IMAGING x48h  - image(s) personally visualized  -   highlighted in bold CT CHEST WO CONTRAST  Result Date: 02/06/2021 CLINICAL DATA:  Hemoptysis EXAM: CT CHEST WITHOUT CONTRAST TECHNIQUE: Multidetector CT imaging of the chest was performed following the standard protocol without IV contrast. COMPARISON:  None. FINDINGS: Cardiovascular: Heart is normal size. Aorta is normal caliber. Mediastinum/Nodes: Soft tissue in the anterior mediastinum felt  represent residual thymus. No mediastinal, hilar, or axillary adenopathy. Trachea and esophagus are unremarkable. Thyroid unremarkable. Lungs/Pleura: Dense consolidation in the right lower lobe. Ground-glass airspace opacities in the right middle lobe. Consolidation also noted in the left lower lobe. Findings compatible with pneumonia. No effusions. Upper Abdomen: Imaging into the upper abdomen demonstrates no acute findings. Musculoskeletal: Chest wall soft tissues are unremarkable. No acute bony abnormality. IMPRESSION: Areas of consolidation in the right lower lobe, left lower lobe and right middle lobe compatible with multifocal pneumonia. Electronically Signed   By: Charlett Nose M.D.   On: 02/06/2021 22:35   DG CHEST PORT 1 VIEW  Result Date: 02/06/2021 CLINICAL DATA:  35 year old female with hemoptysis. EXAM: PORTABLE CHEST 1 VIEW COMPARISON:  None. FINDINGS: Focal right lung base opacity may represent atelectasis but concerning for pneumonia. Clinical correlation is recommended. The left lung is clear. No pleural effusion pneumothorax. There is mild cardiomegaly. No acute osseous pathology. IMPRESSION: Right lung base opacity concerning for pneumonia. Electronically Signed   By: Elgie Collard M.D.   On: 02/06/2021 19:44   ECHOCARDIOGRAM LIMITED  Result Date: 02/06/2021    ECHOCARDIOGRAM REPORT   Patient Name:   LOVETTA CONDIE Date of Exam: 02/06/2021 Medical Rec #:  [id number]     Height:       64.5 in Accession #:    [id number]    Weight:       227.1 lb Date of Birth:  September 05, 1985     BSA:          2.076 m Patient Age:    35 years      BP:           109/88 mmHg Patient Gender: F             HR:           132 bpm. Exam Location:  Inpatient Procedure: Limited Echo, Limited Color Doppler and Cardiac Doppler STAT ECHO Indications:    Cardiomyopathy  History:        Patient has prior history of Echocardiogram examinations. CHF;                 Signs/Symptoms:Hemoptysis.  Sonographer:    Delcie Roch  Referring Phys: [id number] MURALI RAMASWAMY IMPRESSIONS  1. Left ventricular ejection fraction, by estimation, is 10-15%. The left ventricle  has severely decreased function. The left ventricle demonstrates global hypokinesis. The left ventricular internal cavity size was moderately dilated. Left ventricular diastolic function could not be evaluated.  2. Right ventricular systolic function is moderate-severely reduced. The right ventricular size is moderately enlarged. There is moderately elevated pulmonary artery systolic pressure. The estimated right ventricular systolic pressure is 45.9 mmHg.  3. Left atrial size was mildly dilated.  4. The mitral valve is grossly normal. Severe, secondary mitral valve regurgitation with leaflet tenting secondary to LV dysfunction. No evidence of mitral stenosis.  5. Tricuspid valve regurgitation is severe.  6. The aortic valve is normal in structure. Aortic valve regurgitation is not visualized.  7. The inferior vena cava is normal in size with <50% respiratory variability, suggesting right atrial pressure of 8 mmHg. FINDINGS  Left Ventricle: Left ventricular ejection fraction, by estimation, is 10-15%. The left ventricle has severely decreased function. The left ventricle demonstrates global hypokinesis. The left ventricular internal cavity size was moderately dilated. There  is no left ventricular hypertrophy. Left ventricular diastolic function could not be evaluated. Right Ventricle: The right ventricular size is moderately enlarged. No increase in right ventricular wall thickness. Right ventricular systolic function is moderate-severely reduced. There is moderately elevated pulmonary artery systolic pressure. The tricuspid regurgitant velocity is 3.08 m/s, and with an assumed right atrial pressure of 8 mmHg, the estimated right ventricular systolic pressure is 45.9 mmHg. Left Atrium: Left atrial size was mildly dilated. Right Atrium: Right atrial size was normal in size.  Pericardium: There is no evidence of pericardial effusion. Mitral Valve: The mitral valve is grossly normal. Severe mitral valve regurgitation. No evidence of mitral valve stenosis. MV peak gradient, 85.0 mmHg. Tricuspid Valve: The tricuspid valve is grossly normal. Tricuspid valve regurgitation is severe. Aortic Valve: The aortic valve is normal in structure. Aortic valve regurgitation is not visualized. Pulmonic Valve: The pulmonic valve was not well visualized. Aorta: The aortic root is normal in size and structure. Pulmonary Artery: The pulmonary artery is of normal size. Venous: The inferior vena cava is normal in size with less than 50% respiratory variability, suggesting right atrial pressure of 8 mmHg. IAS/Shunts: No atrial level shunt detected by color flow Doppler.  LEFT VENTRICLE PLAX 2D LVIDd:         6.00 cm  Diastology LVIDs:         5.40 cm  LV e' medial:  6.85 cm/s LV PW:         1.10 cm  LV e' lateral: 12.90 cm/s LV IVS:        0.90 cm LVOT diam:     2.00 cm LV SV:         18 LV SV Index:   9 LVOT Area:     3.14 cm  IVC IVC diam: 2.20 cm LEFT ATRIUM           Index LA diam:      5.00 cm 2.41 cm/m LA Vol (A4C): 79.3 ml 38.20 ml/m  AORTIC VALVE LVOT Vmax:   48.40 cm/s LVOT Vmean:  31.300 cm/s LVOT VTI:    0.058 m  AORTA Ao Asc diam: 2.30 cm MITRAL VALVE                 TRICUSPID VALVE MV Peak grad: 85.0 mmHg      TR Peak grad:   37.9 mmHg MV Vmax:      4.61 m/s       TR Vmax:  308.00 cm/s MR Peak grad:    91.4 mmHg MR Mean grad:    57.0 mmHg   SHUNTS MR Vmax:         478.00 cm/s Systemic VTI:  0.06 m MR Vmean:        365.0 cm/s  Systemic Diam: 2.00 cm MR PISA:         1.27 cm MR PISA Eff ROA: 11 mm MR PISA Radius:  0.45 cm Weston Brass MD Electronically signed by Weston Brass MD Signature Date/Time: 02/06/2021/9:39:14 PM    Final    [place] EKG SITE RITE  Result Date: 02/06/2021 If Site Rite image not attached, placement could not be confirmed due to current cardiac rhythm.     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 Severely reduced RV systolic function. Severe secondary MR. BNP 1500. (POA) Core is warm, distal extremities cool. Lactate 4 to 2 with initial interventions. Etiolgy is unclear - drug related but abx not associated with CM and literature search reveals a paucity of information. Viral? Does not give history of one. - Cardiology following, recommending trial of diuresis and and inotrope, appreciate assistance - UOP better after increase dose lasix 40 mg IV, re-dose this AM - Milrinone 0.25 mg/hr, NE low dose to support BP - Co-ox now that PICC in place, trend and assess response to medications  Hemoptysis (POA): Suspect related to pulmonary infiltrates and coagulopathy with likely contribution of elevated PV pressures in setting of volume overload and severe MR. Possible related to PE seen at OSH ([facility]). Images with alveolar filling on right, unclear if DAH vs aspirated blood from other cause of hemoptysis. Cardiogenic shock, not stable for diagnostic bronchoscopy. - Autoimmune, Vasculitis workup from 6/28  pending - Quant Gold 6/28 - check Urine strep, legionella, RVP, MRSA PCR - urine pregnancy - check UDS - Correct INR - Consider empiric steroids if respiratory status worsens, favor holding off in case she can tolerate diagnostic procedure in the future  Pulmonary emobli per report from outside hospital [facility]): segmental and subsegmental PE  - Cannot anticoagulate at this time due to hemoptysis +trial of systemic heparin made things worse at OSH -correct coagulopathy - LE dopplers, consider filter  AKI (POA)  - monitor with cardiac Rx  Congestive Hepatopathy: POA. LFTs, bili elevated. INR 1.9. --HF therapies as above  Elevated INR: POA. suspect related to congestive hepatopathy but nutritional  deficiency considered. --PO Vit K x 3 days  Elevated anion gap metabolic lactic acidosis: POA. lactic acid 4.5 - present on admission --due to cardiogenic shock  Hypomagnesemia POA - Supp mag - Repeat BMP  - Repeat mag in AM  Hypothyroidism: POA. TSH 13 and t4 mildly elevated as well on admission. P - Hold home synthroid for now.  Acute hypoxemic resp failure -POA.  present on admission due to above. Suspect due to pulmonary infiltrates. On 2L [place].  -goal pulse ox > 88%  CAP: POA. dense bilateral LL and RML infiltrates. MRSA swab negative. --CTX/doxy x 5 days  GGOs: POA. On CT. Suspect aspirated blood. DAH possible. Denser infiltrates not typical for vasculitis. Sed rate normal. --CRP --work up as above for hemoptysis  Best Practice (right click and "Reselect all SmartList Selections" daily)   Diet/type: NPO and NPO w/ oral meds DVT prophylaxis: SCD GI prophylaxis: PPI Lines: N/A, Central line, and yes and it is  still needed Foley:  Yes, and it is still needed Code Status:  full code Last date of multidisciplinary goals of care discussion [ 6/28]  CRITICAL CARE Performed by: Karren BurlyMatthew R Latrena Benegas   Total critical care time: 60 minutes  Critical care time was exclusive of separately billable procedures and treating other patients.  Critical care was necessary to treat or prevent imminent or life-threatening deterioration.  Critical care was time spent personally by me on the following activities: development of treatment plan with patient and/or surrogate as well as nursing, discussions with consultants, evaluation of patient's response to treatment, examination of patient, obtaining history from patient or surrogate, ordering and performing treatments and interventions, ordering and review of laboratory studies, ordering and review of radiographic studies, pulse oximetry and re-evaluation of patient's condition.

## 2021-02-07 NOTE — Progress Notes (Signed)
PCCM INTERVAL PROGRESS NOTE   Patient reports hemoptysis is improving No significant urine output prior to milrinone, however, once milrinone started at 0.25 urine output began to pick up quite a bit. Clear sight monitor reading borderline low CO, CI and low SV prior to milrinone, and all have trended up since starting it.   Continue milrinone.  Ongoing diuresis      Joneen Roach, AGACNP-BC [facility]  See Amion for personal pager PCCM on call pager [phone] until 7pm. Please call Elink 7p-7a. [phone]  02/07/2021 1:35 AM

## 2021-02-07 NOTE — Consult Note (Signed)
Advanced Heart Failure Team Consult Note   Primary Physician: Patient, No Pcp Per (Inactive) PCP-Cardiologist:  None  Reason for Consultation: Cardiogenic shock  HPI:    Debra Daniel is a 35 year old female seen today for evaluation of cardiogenic shock at the request of Cardiology. She had no significant medical history until recently. She was recently diagnosed with hypothyroidism. On 05/14 she was seen in ED with recurrent nausea and vomiting X 4 days. She was tachycardic with rates in 130s. LFTs mildly elevated. She was given IV fluids and one dose of fosfomycin for possible UTI. Reports predominant symptom at the time was decreased urine output. She later followed up in [place] and states she was given nitrofurantoin and later amoxicillin when ongoing UTI was suspected. Afterwards she continued to feel weak for several weeks and continued with episodes of nausea and vomiting. She's noticed lower extremity edema for the last month. No dyspnea orthopnea or PND. No fevers or chills reported. She reports a cough with clear sputum that started about a month ago. It later resolved on its own but returned last weekend.   On June 25th, she began experiencing hemoptysis and sought ED evaluation 06/27 in [place]. CTA chest abdomen pelvis with acute RLL segmental and subsegmental PE, crazy-paving pattern of consolidation in bilateral lower lobes, diffuse anasarca, no definite pathology in abdomen and pelvis. OCP were stopped. She had worsening hemoptysis with IV heparin which was stopped. Echo demonstrated EF of 25-30%, moderate to severe MR, RVSP 45 mmHg, moderate TR. She was transferred to [facility] for additional pulmonary and cardiac evaluation.   Upon arrival to [facility], she was evaluated by Cardiology and stat echo demonstrated LVEF of 10-15% with moderately dilated LV, severe secondary MR, moderate to severe RV dysfunction with moderately enlarged RV, RVSP 45 mmHg, severe TR.   She developed  cardiogenic shock and has since been placed on milrinone 0.25 mcg. She was briefly on low-dose norepinephrine which was discontinued this am. She received 20 mg and 40 mg lasix IV with 1.3 L output so far.   Reports feeling better today. Continues to have some hemoptysis but improving. No shortness of breath or orthopnea. Feels edema is improving.   Denies any history of alcohol, tobacco or illicit drug use. She is not currently employed. Reports spending quite a bit of time with her parents and nephew during the day. Her maternal grandmother had congestive heart failure. Otherwise no known family with history of cardiomyopathy, CHF, CAD or sudden cardiac death.  Review of Systems: [y] = yes, [ ]  = no   General: Weight gain [ ] ; Weight loss [Y]; Anorexia [Y]; Fatigue [Y]; Fever [ ] ; Chills [ ] ; Weakness [ ]   Cardiac: Chest pain/pressure [ ] ; Resting SOB [ ] ; Exertional SOB [ ] ; Orthopnea [ ] ; Pedal Edema [Y]; Palpitations [ ] ; Syncope [ ] ; Presyncope [ ] ; Paroxysmal nocturnal dyspnea[ ]   Pulmonary: Cough [Y]; Wheezing[ ] ; Hemoptysis[Y]; Sputum [ ] ; Snoring [ ]   GI: Vomiting[Y]; Dysphagia[ ] ; Melena[ ] ; Hematochezia [ ] ; Heartburn[ ] ; Abdominal pain [ ] ; Constipation [ ] ; Diarrhea [ ] ; BRBPR [ ]   GU: Hematuria[ ] ; Dysuria [Y]; Nocturia[ ]   Vascular: Pain in legs with walking [ ] ; Pain in feet with lying flat [ ] ; Non-healing sores [ ] ; Stroke [ ] ; TIA [ ] ; Slurred speech [ ] ;  Neuro: Headaches[ ] ; Vertigo[ ] ; Seizures[ ] ; Paresthesias[ ] ;Blurred vision [ ] ; Diplopia [ ] ; Vision changes [ ]   Ortho/Skin: Arthritis [ ] ; Joint pain [ ] ;  Muscle pain [ ] ; Joint swelling [ ] ; Back Pain [ ] ; Rash [ ]   Psych: Depression[ ] ; Anxiety[ ]   Heme: Bleeding problems [ ] ; Clotting disorders [ ] ; Anemia [ ]   Endocrine: Diabetes [ ] ; Thyroid dysfunction[Y]  Home Medications Prior to Admission medications   Medication Sig Start Date End Date Taking? Authorizing Provider  amoxicillin-clavulanate (AUGMENTIN) 875-125  MG tablet Take 1 tablet by mouth 2 (two) times daily. For 7 days 01/05/21   [provider]  cyclobenzaprine (FLEXERIL) 5 MG tablet Take 5 mg by mouth 3 (three) times daily as needed for muscle spasms. 01/05/21   [provider]  DAYSEE 0.15-0.03 &0.01 MG tablet TAKE 1 TABLET BY MOUTH ONCE DAILY. Patient taking differently: Take 1 tablet by mouth daily. 01/16/21   Adline PotterGriffin, Jennifer A, NP  levothyroxine (SYNTHROID) 75 MCG tablet Take 75 mcg by mouth daily. 01/19/21   [provider]  nitrofurantoin, macrocrystal-monohydrate, (MACROBID) 100 MG capsule Take 100 mg by mouth 2 (two) times daily. For 7 days 12/26/20   [provider]  ondansetron (ZOFRAN) 4 MG tablet Take 4 mg by mouth every 8 (eight) hours as needed for nausea/vomiting. 12/20/20   [provider]  phenazopyridine (PYRIDIUM) 200 MG tablet Take 200 mg by mouth 3 (three) times daily. 12/26/20   [provider]  promethazine (PHENERGAN) 25 MG suppository Place 1 suppository (25 mg total) rectally every 6 (six) hours as needed for nausea or vomiting. 12/23/20   Melene PlanFloyd, Dan, DO  promethazine (PHENERGAN) 25 MG tablet Take 1 tablet (25 mg total) by mouth every 6 (six) hours as needed for nausea or vomiting. 12/23/20   Melene PlanFloyd, Dan, DO    Past Medical History: Past Medical History:  Diagnosis Date   Encounter for menstrual regulation 01/24/2015   Hypothyroid    Migraines    Obesity    UTI (urinary tract infection)     Past Surgical History: Past Surgical History:  Procedure Laterality Date   broken collar bone  1994    Family History: Family History  Problem Relation Age of Onset   Hypertension Paternal Grandmother    Coronary artery disease Paternal Grandmother    Hypertension Maternal Grandmother    Diabetes Maternal Grandmother    Hyperlipidemia Maternal Grandmother    Thyroid disease Maternal Grandmother    Kidney disease Maternal Grandmother    Diabetes Father    Hypertension  Mother    Hyperlipidemia Mother    COPD Paternal Grandfather    Heart disease Maternal Grandfather    Diabetes Maternal Grandfather    Hypertension Brother    Hyperlipidemia Brother     Social History: Social History   Socioeconomic History   Marital status: Single    Spouse name: Not on file   Number of children: Not on file   Years of education: Not on file   Highest education level: Not on file  Occupational History   Not on file  Tobacco Use   Smoking status: Never   Smokeless tobacco: Never  Vaping Use   Vaping Use: Never used  Substance and Sexual Activity   Alcohol use: No   Drug use: No   Sexual activity: Never    Birth control/protection: Pill  Other Topics Concern   Not on file  Social History Narrative   Not on file   Social Determinants of Health   Financial Resource Strain: Low Risk    Difficulty of Paying Living Expenses: Not hard at all  Food Insecurity: No  Food Insecurity   Worried About Programme researcher, broadcasting/film/video in the Last Year: Never true   Ran Out of Food in the Last Year: Never true  Transportation Needs: No Transportation Needs   Lack of Transportation (Medical): No   Lack of Transportation (Non-Medical): No  Physical Activity: Insufficiently Active   Days of Exercise per Week: 2 days   Minutes of Exercise per Session: 30 min  Stress: No Stress Concern Present   Feeling of Stress : Not at all  Social Connections: Socially Isolated   Frequency of Communication with Friends and Family: More than three times a week   Frequency of Social Gatherings with Friends and Family: More than three times a week   Attends Religious Services: Never   Database administrator or Organizations: No   Attends Banker Meetings: Never   Marital Status: Never married    Allergies:  Allergies  Allergen Reactions   Sulfa Antibiotics Hives   Ibuprofen Other (See Comments)    constipation   Elemental Sulfur Rash    Objective:    Vital  Signs:   Temp:  [97.9 F (36.6 C)-98.3 F (36.8 C)] 98.1 F (36.7 C) (06/29 0722) Pulse Rate:  [99-136] 128 (06/29 1000) Resp:  [14-41] 14 (06/29 1000) BP: (74-113)/(54-89) 112/89 (06/29 1000) SpO2:  [78 %-100 %] 95 % (06/29 1000) Weight:  [103 kg-104.9 kg] 103.6 kg (06/29 0630) Last BM Date: 02/06/21  Weight change: Filed Weights   02/06/21 1812 02/06/21 2015 02/07/21 0630  Weight: 103 kg 104.9 kg 103.6 kg    Intake/Output:   Intake/Output Summary (Last 24 hours) at 02/07/2021 1020 Last data filed at 02/07/2021 0951 Gross per 24 hour  Intake 759 ml  Output 2125 ml  Net -1366 ml      Physical Exam    General:  Well appearing. No resp difficulty on 2L 02 [place]. Intermittently coughing up blood. HEENT: normal Neck: supple. No JVD. Carotids 2+ bilat; no bruits. No lymphadenopathy or thyromegaly appreciated. Cor: PMI nondisplaced. Regular rhythm. Tachycardic.  Lungs: Diminished breath sounds throughout Abdomen: soft, nontender, nondistended. No hepatosplenomegaly. No bruits or masses. Good bowel sounds. Extremities: 1-2 + bilateral lower extremity edema Neuro: alert & orientedx3, cranial nerves grossly intact. moves all 4 extremities w/o difficulty. Affect pleasant   Telemetry   Sinus tachycardia, rates 120s  EKG    Sinus tachycardia with rate of 128 bpm, single PVC, slow R wave progression  Labs   Basic Metabolic Panel: Recent Labs  Lab 02/06/21 2008 02/07/21 0226  NA 131* 136  K 4.0 3.4*  CL 96* 97*  CO2 18* 24  GLUCOSE 135* 113*  BUN 21* 21*  CREATININE 1.13* 1.13*  CALCIUM 7.9* 8.1*  MG 1.6*  --     Liver Function Tests: Recent Labs  Lab 02/06/21 2008  AST 39  ALT 55*  ALKPHOS 61  BILITOT 3.5*  PROT 5.2*  ALBUMIN 2.4*   No results for input(s): LIPASE, AMYLASE in the last 168 hours. No results for input(s): AMMONIA in the last 168 hours.  CBC: Recent Labs  Lab 02/06/21 2008 02/06/21 2018 02/07/21 0226  WBC 17.5*  --  17.0*  HGB 12.6   --  13.5  HCT 38.3  --  41.1  MCV 79.8*  --  81.2  PLT 156 172 150    Cardiac Enzymes: No results for input(s): CKTOTAL, CKMB, CKMBINDEX, TROPONINI in the last 168 hours.  BNP: BNP (last 3 results) Recent Labs  02/06/21 2018  BNP 1,571.8*    ProBNP (last 3 results) No results for input(s): PROBNP in the last 8760 hours.   CBG: Recent Labs  Lab 02/06/21 12-Jan-2021  GLUCAP 134*    Coagulation Studies: Recent Labs    02/06/21 01-13-2007 02/06/21 2018  LABPROT 21.5* 22.0*  INR 1.9* 1.9*     Imaging   CT CHEST WO CONTRAST  Result Date: 02/06/2021 CLINICAL DATA:  Hemoptysis EXAM: CT CHEST WITHOUT CONTRAST TECHNIQUE: Multidetector CT imaging of the chest was performed following the standard protocol without IV contrast. COMPARISON:  None. FINDINGS: Cardiovascular: Heart is normal size. Aorta is normal caliber. Mediastinum/Nodes: Soft tissue in the anterior mediastinum felt represent residual thymus. No mediastinal, hilar, or axillary adenopathy. Trachea and esophagus are unremarkable. Thyroid unremarkable. Lungs/Pleura: Dense consolidation in the right lower lobe. Ground-glass airspace opacities in the right middle lobe. Consolidation also noted in the left lower lobe. Findings compatible with pneumonia. No effusions. Upper Abdomen: Imaging into the upper abdomen demonstrates no acute findings. Musculoskeletal: Chest wall soft tissues are unremarkable. No acute bony abnormality. IMPRESSION: Areas of consolidation in the right lower lobe, left lower lobe and right middle lobe compatible with multifocal pneumonia. Electronically Signed   By: Charlett Nose M.D.   On: 02/06/2021 22:35   DG CHEST PORT 1 VIEW  Result Date: 02/06/2021 CLINICAL DATA:  35 year old female with hemoptysis. EXAM: PORTABLE CHEST 1 VIEW COMPARISON:  None. FINDINGS: Focal right lung base opacity may represent atelectasis but concerning for pneumonia. Clinical correlation is recommended. The left lung is clear. No  pleural effusion pneumothorax. There is mild cardiomegaly. No acute osseous pathology. IMPRESSION: Right lung base opacity concerning for pneumonia. Electronically Signed   By: Elgie Collard M.D.   On: 02/06/2021 19:44   ECHOCARDIOGRAM LIMITED  Result Date: 02/06/2021    ECHOCARDIOGRAM REPORT   Patient Name:   Debra Daniel Date of Exam: 02/06/2021 Medical Rec #:  [id number]     Height:       64.5 in Accession #:    [id number]    Weight:       227.1 lb Date of Birth:  12-17-1985     BSA:          2.076 m Patient Age:    35 years      BP:           109/88 mmHg Patient Gender: F             HR:           132 bpm. Exam Location:  Inpatient Procedure: Limited Echo, Limited Color Doppler and Cardiac Doppler STAT ECHO Indications:    Cardiomyopathy  History:        Patient has prior history of Echocardiogram examinations. CHF;                 Signs/Symptoms:Hemoptysis.  Sonographer:    Delcie Roch Referring Phys: [id number] MURALI RAMASWAMY IMPRESSIONS  1. Left ventricular ejection fraction, by estimation, is 10-15%. The left ventricle has severely decreased function. The left ventricle demonstrates global hypokinesis. The left ventricular internal cavity size was moderately dilated. Left ventricular diastolic function could not be evaluated.  2. Right ventricular systolic function is moderate-severely reduced. The right ventricular size is moderately enlarged. There is moderately elevated pulmonary artery systolic pressure. The estimated right ventricular systolic pressure is 45.9 mmHg.  3. Left atrial size was mildly dilated.  4. The mitral valve is grossly normal. Severe, secondary mitral valve regurgitation with leaflet  tenting secondary to LV dysfunction. No evidence of mitral stenosis.  5. Tricuspid valve regurgitation is severe.  6. The aortic valve is normal in structure. Aortic valve regurgitation is not visualized.  7. The inferior vena cava is normal in size with <50% respiratory variability, suggesting  right atrial pressure of 8 mmHg. FINDINGS  Left Ventricle: Left ventricular ejection fraction, by estimation, is 10-15%. The left ventricle has severely decreased function. The left ventricle demonstrates global hypokinesis. The left ventricular internal cavity size was moderately dilated. There  is no left ventricular hypertrophy. Left ventricular diastolic function could not be evaluated. Right Ventricle: The right ventricular size is moderately enlarged. No increase in right ventricular wall thickness. Right ventricular systolic function is moderate-severely reduced. There is moderately elevated pulmonary artery systolic pressure. The tricuspid regurgitant velocity is 3.08 m/s, and with an assumed right atrial pressure of 8 mmHg, the estimated right ventricular systolic pressure is 45.9 mmHg. Left Atrium: Left atrial size was mildly dilated. Right Atrium: Right atrial size was normal in size. Pericardium: There is no evidence of pericardial effusion. Mitral Valve: The mitral valve is grossly normal. Severe mitral valve regurgitation. No evidence of mitral valve stenosis. MV peak gradient, 85.0 mmHg. Tricuspid Valve: The tricuspid valve is grossly normal. Tricuspid valve regurgitation is severe. Aortic Valve: The aortic valve is normal in structure. Aortic valve regurgitation is not visualized. Pulmonic Valve: The pulmonic valve was not well visualized. Aorta: The aortic root is normal in size and structure. Pulmonary Artery: The pulmonary artery is of normal size. Venous: The inferior vena cava is normal in size with less than 50% respiratory variability, suggesting right atrial pressure of 8 mmHg. IAS/Shunts: No atrial level shunt detected by color flow Doppler.  LEFT VENTRICLE PLAX 2D LVIDd:         6.00 cm  Diastology LVIDs:         5.40 cm  LV e' medial:  6.85 cm/s LV PW:         1.10 cm  LV e' lateral: 12.90 cm/s LV IVS:        0.90 cm LVOT diam:     2.00 cm LV SV:         18 LV SV Index:   9 LVOT Area:      3.14 cm  IVC IVC diam: 2.20 cm LEFT ATRIUM           Index LA diam:      5.00 cm 2.41 cm/m LA Vol (A4C): 79.3 ml 38.20 ml/m  AORTIC VALVE LVOT Vmax:   48.40 cm/s LVOT Vmean:  31.300 cm/s LVOT VTI:    0.058 m  AORTA Ao Asc diam: 2.30 cm MITRAL VALVE                 TRICUSPID VALVE MV Peak grad: 85.0 mmHg      TR Peak grad:   37.9 mmHg MV Vmax:      4.61 m/s       TR Vmax:        308.00 cm/s MR Peak grad:    91.4 mmHg MR Mean grad:    57.0 mmHg   SHUNTS MR Vmax:         478.00 cm/s Systemic VTI:  0.06 m MR Vmean:        365.0 cm/s  Systemic Diam: 2.00 cm MR PISA:         1.27 cm MR PISA Eff ROA: 11 mm MR PISA Radius:  0.45 cm [employer]  Jacques Navy MD Electronically signed by Weston Brass MD Signature Date/Time: 02/06/2021/9:39:14 PM    Final    [place] EKG SITE RITE  Result Date: 02/06/2021 If Site Rite image not attached, placement could not be confirmed due to current cardiac rhythm.    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  -Echo here with EF of 10-15%, moderately dilated LV, moderate to severely reduced RV fxn with moderately dilated RV, RVSP 45 mmHg, severe secondary MR -currently on 0.25 milrinone and norepinephrine now off. Lactic acid improved from 4.3 to 2.3. C02 18 >  24. -Diuresing after 20 mg lasix IV last night and 40 mg lasix IV this am. K 3.4. CCM replacing. -CO-OX 63.1%, CVP 6 -Not certain how much of presenting symptoms back in May were due to low output state or UTI. -Etiology for CHF not entirely certain. HIV -. No known family history of CHF other than maternal grandmother. Denies alcohol or illicit drug use. HS troponin minimally elevated with flat trend which is less suspicious for myocarditis. Ischemic heart disease less likely d/t her age and minimal risk factors. Stress induced cardiomyopathy a consideration. -Unable to start GDMT d/t shock Mitral valve regurgitation: -Severe mitral valve regurgitation noted on echo this admission. Likely secondary. Hemoptysis/possible PE/community acquired pneumonia -Possible PE noted on imaging at OSH. Worsening hemoptysis after IV heparin. Unable to anticoagulate. Venous duplex negative for DVT. -OCP D/Cd. -CCM evaluating.  -Vasculitis workup pending. -CT chest here with consolidation both lower lobes. Ground glass opacities right middle lobe. -On antibiotics for CAP. WBC 17.0.  Hypothyroidism -Recently diagnosed -TSH 13.5/Free T4 1.39 -On synthroid PTA Elevated INR 1-.9 yesterday evening. -Possibly due to passive congestion from CHF. -Given VitK per CCM. Hyponatremia Na 131 > improved to 136 today  Length of Stay: 1  FINCH, LINDSAY N, PA-C  02/07/2021, 10:20 AM  Advanced Heart Failure Team Pager [phone] (M-F; 7a - 5p)  Please contact [facility] for night-coverage after hours (4p -7a ) and weekends on [email or web address]   Agree with above.   35 y/o female with morbid obesity, hypothyroidism, ? cognitive delay admitted with shock, massive hemoptysis. Found to have segmental PE. PNA and severe biventricular dysfunction with EF 15% and severe MR.   LE dopplers negative for DVT  Currently on milrinone 0.25 and NE 2 with co-ox 64% CVP 6. Very tachycardic.   Limited historian but denies fevers,  chills, rash, arthalgias, etc.   General:  Obese woman lying in bed coughing up blood. No resp difficulty HEENT: normal Neck: supple. Jvp 6 Carotids 2+ bilat; no bruits. No lymphadenopathy or thryomegaly appreciated. Cor: PMI nondisplaced. Regular tachy + s3.  Lungs: coarse Abdomen: obese soft, nontender, nondistended. No hepatosplenomegaly. No bruits or masses. Good bowel  sounds. Extremities: no cyanosis, clubbing, rash, 1-2+ edema warm  Neuro: alert & orientedx3, cranial nerves grossly intact. moves all 4 extremities w/o difficulty. Affect pleasant  Suspect initial process was likely viral myocarditis leading to inactivity followed by DVT-> PE and PNA but there certainly could be another underlying process. Agree with w/u for CTD.  Given volume of hemoptysis it is likely not related to HF. Possibly due to PNA. Await CTD serologies.   For now continue support with inotropes. Add digoxin and spiro. CVP ok so can hold diuretics, Will need to watch closely. If has hemodynamic deterioration, ECMO support likely only option given degree of RV dysfunction. Will eventually need cMRI if she can tolerate.  CRITICAL CARE Performed by: Arvilla Meres  Total critical care time: 55 minutes  Critical care time was exclusive of separately billable procedures and treating other patients.  Critical care was necessary to treat or prevent imminent or life-threatening deterioration.  Critical care was time spent personally by me (independent of midlevel providers or residents) on the following activities: development of treatment plan with patient and/or surrogate as well as nursing, discussions with consultants, evaluation of patient's response to treatment, examination of patient, obtaining history from patient or surrogate, ordering and performing treatments and interventions, ordering and review of laboratory studies, ordering and review of radiographic studies, pulse oximetry and re-evaluation of  patient's condition.  Arvilla Meres, MD  5:21 PM

## 2021-02-08 ENCOUNTER — Other Ambulatory Visit (HOSPITAL_COMMUNITY): Payer: Self-pay

## 2021-02-08 LAB — CBC WITH DIFFERENTIAL/PLATELET
Abs Immature Granulocytes: 0.15 10*3/uL — ABNORMAL HIGH (ref 0.00–0.07)
Basophils Absolute: 0 10*3/uL (ref 0.0–0.1)
Basophils Relative: 0 %
Eosinophils Absolute: 0 10*3/uL (ref 0.0–0.5)
Eosinophils Relative: 0 %
HCT: 30.6 % — ABNORMAL LOW (ref 36.0–46.0)
Hemoglobin: 9.8 g/dL — ABNORMAL LOW (ref 12.0–15.0)
Immature Granulocytes: 1 %
Lymphocytes Relative: 9 %
Lymphs Abs: 1.8 10*3/uL (ref 0.7–4.0)
MCH: 26.1 pg (ref 26.0–34.0)
MCHC: 32 g/dL (ref 30.0–36.0)
MCV: 81.6 fL (ref 80.0–100.0)
Monocytes Absolute: 1.2 10*3/uL — ABNORMAL HIGH (ref 0.1–1.0)
Monocytes Relative: 6 %
Neutro Abs: 16.4 10*3/uL — ABNORMAL HIGH (ref 1.7–7.7)
Neutrophils Relative %: 84 %
Platelets: 145 10*3/uL — ABNORMAL LOW (ref 150–400)
RBC: 3.75 MIL/uL — ABNORMAL LOW (ref 3.87–5.11)
RDW: 16.4 % — ABNORMAL HIGH (ref 11.5–15.5)
WBC: 19.6 10*3/uL — ABNORMAL HIGH (ref 4.0–10.5)
nRBC: 0 % (ref 0.0–0.2)

## 2021-02-08 LAB — PROTIME-INR
INR: 1.6 — ABNORMAL HIGH (ref 0.8–1.2)
Prothrombin Time: 19 seconds — ABNORMAL HIGH (ref 11.4–15.2)

## 2021-02-08 LAB — MAGNESIUM: Magnesium: 1.7 mg/dL (ref 1.7–2.4)

## 2021-02-08 LAB — LACTIC ACID, PLASMA: Lactic Acid, Venous: 1.9 mmol/L (ref 0.5–1.9)

## 2021-02-08 LAB — BASIC METABOLIC PANEL
Anion gap: 12 (ref 5–15)
Anion gap: 11 (ref 5–15)
BUN: 17 mg/dL (ref 6–20)
BUN: 22 mg/dL — ABNORMAL HIGH (ref 6–20)
CO2: 22 mmol/L (ref 22–32)
CO2: 23 mmol/L (ref 22–32)
Calcium: 6.7 mg/dL — ABNORMAL LOW (ref 8.9–10.3)
Calcium: 7.5 mg/dL — ABNORMAL LOW (ref 8.9–10.3)
Chloride: 101 mmol/L (ref 98–111)
Chloride: 98 mmol/L (ref 98–111)
Creatinine, Ser: 0.99 mg/dL (ref 0.44–1.00)
Creatinine, Ser: 1.31 mg/dL — ABNORMAL HIGH (ref 0.44–1.00)
GFR, Estimated: >60 mL/min (ref >60)
GFR, Estimated: 55 mL/min — ABNORMAL LOW (ref >60)
Glucose, Bld: 161 mg/dL — ABNORMAL HIGH (ref 70–99)
Glucose, Bld: 205 mg/dL — ABNORMAL HIGH (ref 70–99)
Potassium: 3.1 mmol/L — ABNORMAL LOW (ref 3.5–5.1)
Potassium: 4.8 mmol/L (ref 3.5–5.1)
Sodium: 135 mmol/L (ref 135–145)
Sodium: 132 mmol/L — ABNORMAL LOW (ref 135–145)

## 2021-02-08 LAB — COOXEMETRY PANEL
Carboxyhemoglobin: 1.5 % (ref 0.5–1.5)
Carboxyhemoglobin: 1.4 % (ref 0.5–1.5)
Methemoglobin: 0.5 % (ref 0.0–1.5)
Methemoglobin: 0.8 % (ref 0.0–1.5)
O2 Saturation: 75 %
O2 Saturation: 61.5 %
Total hemoglobin: 10 g/dL — ABNORMAL LOW (ref 12.0–16.0)
Total hemoglobin: 10.1 g/dL — ABNORMAL LOW (ref 12.0–16.0)

## 2021-02-08 LAB — LEGIONELLA PNEUMOPHILA SEROGP 1 UR AG: L. pneumophila Serogp 1 Ur Ag: NEGATIVE

## 2021-02-08 LAB — PROCALCITONIN: Procalcitonin: 1.68 ng/mL

## 2021-02-08 LAB — GLUCOSE, CAPILLARY: Glucose-Capillary: 155 mg/dL — ABNORMAL HIGH (ref 70–99)

## 2021-02-08 LAB — ANA W/REFLEX IF POSITIVE: Anti Nuclear Antibody (ANA): NEGATIVE

## 2021-02-08 LAB — PREGNANCY, URINE: Preg Test, Ur: NEGATIVE

## 2021-02-08 MED ORDER — FUROSEMIDE 10 MG/ML IJ SOLN
40.0000 mg | Freq: Once | INTRAMUSCULAR | Status: AC
  Administered 2021-02-08: 40 mg via INTRAVENOUS
  Filled 2021-02-08: qty 4

## 2021-02-08 MED ORDER — LEVALBUTEROL HCL 0.63 MG/3ML IN NEBU
0.6300 mg | INHALATION_SOLUTION | Freq: Once | RESPIRATORY_TRACT | Status: AC
  Administered 2021-02-08: 0.63 mg via RESPIRATORY_TRACT
  Filled 2021-02-08: qty 3

## 2021-02-08 MED ORDER — DIGOXIN 125 MCG PO TABS: 0.1250 mg | ORAL_TABLET | Freq: Every day | ORAL | Status: DC

## 2021-02-08 MED ORDER — MIDODRINE HCL 5 MG PO TABS
2.5000 mg | ORAL_TABLET | Freq: Three times a day (TID) | ORAL | Status: DC
  Administered 2021-02-09: 2.5 mg via ORAL
  Filled 2021-02-08: qty 1

## 2021-02-08 MED ORDER — POTASSIUM CHLORIDE CRYS ER 20 MEQ PO TBCR
40.0000 meq | EXTENDED_RELEASE_TABLET | Freq: Once | ORAL | Status: AC
  Administered 2021-02-08: 40 meq via ORAL
  Filled 2021-02-08: qty 2

## 2021-02-08 MED ORDER — FUROSEMIDE 10 MG/ML IJ SOLN
60.0000 mg | Freq: Once | INTRAMUSCULAR | Status: AC
  Administered 2021-02-08: 60 mg via INTRAVENOUS
  Filled 2021-02-08: qty 6

## 2021-02-08 MED ORDER — DIGOXIN 125 MCG PO TABS
0.1250 mg | ORAL_TABLET | Freq: Every day | ORAL | Status: DC
  Administered 2021-02-09 – 2021-02-22 (×14): 0.125 mg via ORAL
  Filled 2021-02-08 (×14): qty 1

## 2021-02-08 MED ORDER — PHYTONADIONE 5 MG PO TABS
10.0000 mg | ORAL_TABLET | Freq: Every day | ORAL | Status: AC
  Administered 2021-02-08 – 2021-02-09 (×2): 10 mg via ORAL
  Filled 2021-02-08 (×2): qty 2

## 2021-02-08 MED ORDER — POTASSIUM CHLORIDE 10 MEQ/50ML IV SOLN
10.0000 meq | INTRAVENOUS | Status: AC
  Administered 2021-02-08 (×6): 10 meq via INTRAVENOUS
  Filled 2021-02-08 (×6): qty 50

## 2021-02-08 MED ORDER — FUROSEMIDE 10 MG/ML IJ SOLN
10.0000 mg/h | INTRAVENOUS | Status: DC
  Administered 2021-02-08: 10 mg/h via INTRAVENOUS
  Administered 2021-02-09 – 2021-02-10 (×3): 15 mg/h via INTRAVENOUS
  Administered 2021-02-11 (×2): 8 mg/h via INTRAVENOUS
  Administered 2021-02-12: 10 mg/h via INTRAVENOUS
  Filled 2021-02-08 (×6): qty 20

## 2021-02-08 MED ORDER — POTASSIUM CHLORIDE CRYS ER 20 MEQ PO TBCR
40.0000 meq | EXTENDED_RELEASE_TABLET | Freq: Two times a day (BID) | ORAL | Status: DC
  Administered 2021-02-08 – 2021-02-09 (×3): 40 meq via ORAL
  Filled 2021-02-08 (×3): qty 2

## 2021-02-08 MED ORDER — MAGNESIUM SULFATE 2 GM/50ML IV SOLN
2.0000 g | Freq: Once | INTRAVENOUS | Status: AC
  Administered 2021-02-08: 2 g via INTRAVENOUS
  Filled 2021-02-08: qty 50

## 2021-02-08 NOTE — Progress Notes (Signed)
eLink Physician-Brief Progress Note Patient Name: Debra Daniel DOB: 01/30/1986 MRN: [id number]   Date of Service  02/08/2021  HPI/Events of Note  Patient sob with some wheezes and rhonchi.  eICU Interventions  Xopenex nebs x I  ordered.        Thomasene Lot Ramonte Mena 02/08/2021, 2:53 AM

## 2021-02-08 NOTE — TOC Initial Note (Signed)
Transition of Care (TOC) - Initial/Assessment Note  Heart Failure   Patient Details  Name: Debra Daniel MRN: [id number] Date of Birth: [birth year]  Transition of Care [facility]) CM/SW Contact:    Lorrain Rivers, LCSWA Phone Number: 02/08/2021, 11:39 AM  Clinical Narrative:                CSW received a secure chat from the pharmacist about the patient needing health insurance as she only has Medicaid Family Planning. Per pharmacist the patient has a PE which isn't being treated and the patient is in ICU and she will need patient assistance for [facility] at some point when her hemoptysis improves but that is down the road, via pharmacist. CSW reached out to [facility] [employer] to ask about screening the patient for full Medicaid. CSW will provide the patient with a CAFA application as she improves.  CSW will continue to follow throughout discharge.       Patient Goals and CMS Choice        Expected Discharge Plan and Services                                                Prior Living Arrangements/Services                       Activities of Daily Living Home Assistive Devices/Equipment: None ADL Screening (condition at time of admission) Patient's cognitive ability adequate to safely complete daily activities?: Yes Is the patient deaf or have difficulty hearing?: No Does the patient have difficulty seeing, even when wearing glasses/contacts?: No Does the patient have difficulty concentrating, remembering, or making decisions?: No Patient able to express need for assistance with ADLs?: Yes Does the patient have difficulty dressing or bathing?: No Independently performs ADLs?: Yes (appropriate for developmental age) Does the patient have difficulty walking or climbing stairs?: No Weakness of Legs: None Weakness of Arms/Hands: None  Permission Sought/Granted                  Emotional Assessment              Admission diagnosis:  Cardiogenic shock (HCC)  [R57.0] Patient Active Problem List   Diagnosis Date Noted   Cardiogenic shock (HCC) 02/07/2021   Hemoptysis 02/06/2021   Acute systolic CHF (congestive heart failure) (HCC) 02/06/2021   Pulmonary embolism (HCC) 02/06/2021   Hypothyroidism 02/06/2021   Tachycardia 02/06/2021   Encounter for gynecological examination with Papanicolaou smear of cervix 07/25/2020   Screening examination for STD (sexually transmitted disease) 04/09/2018   Encounter for well woman exam with routine gynecological exam 04/09/2018   Family planning 04/09/2018   Encounter for menstrual regulation 01/24/2015   PCP:  Patient, No Pcp Per (Inactive) Pharmacy:   [facility], [place]. - [place], [place] - [address] [place] [address] Phone: [phone] Fax: [phone]     Social Determinants of Health (SDOH) Interventions    Readmission Risk Interventions No flowsheet data found.  Fatime Biswell, MSW, LCSWA [phone] Heart Failure Social Worker

## 2021-02-08 NOTE — Progress Notes (Addendum)
NAME:  Debra Daniel, MRN:  [id number], DOB:  1985/11/11, LOS: 2 ADMISSION DATE:  02/06/2021, CONSULTATION DATE:  6/28 REFERRING MD:  Dr. Adela Glimpse, CHIEF COMPLAINT:  hemoptysis   History of Present Illness:  35 year old female with no significant past medical history up until quite recently. Three months prior to admission she was diagnosed with hypothyroid and was started on synthroid. Then, approximately 3 months prior to presentation she developed UTI symptoms. She is unclear what medications she was treated with, although she remembers the first one was ineffective and a second antibiotic was needed. Progressive lower extremity edema since that time as well. Then 6/25 she began to experience mild hemoptysis, which prompted her to present to [facility] 6/27. Workup in the ED included CTA chest and CT abdomen/pelvis. She was found to have right sided segmental PE as well a crazy paving pattern on the CT chest. CT abdomen was non-acute. She was admitted. Started on heparin for PE, which caused her hemoptysis to become much worse. Heparin was stopped. Echocardiogram done demonstrated LVEF 25-30% as well as mod-severe mitral regurgitation. She was transferred to [facility] for further evaluation.   Pertinent  Medical History   has a past medical history of Encounter for menstrual regulation (01/24/2015), Hypothyroid, Migraines, Obesity, and UTI (urinary tract infection).    has a past medical history of Encounter for menstrual regulation (01/24/2015), Hypothyroid, Migraines, Obesity, and UTI (urinary tract infection).   reports that she has never smoked. She has never used smokeless tobacco.  Past Surgical History:  Procedure Laterality Date   broken collar bone  1994    Allergies  Allergen Reactions   Sulfa Antibiotics Hives   Ibuprofen Other (See Comments)    Constipation Can tolerate w milk of mag    Immunization History  Administered Date(s) Administered   Moderna  Sars-Covid-2 Vaccination 12/06/2019, 01/05/2020    Family History  Problem Relation Age of Onset   Hypertension Paternal Grandmother    Coronary artery disease Paternal Grandmother    Hypertension Maternal Grandmother    Diabetes Maternal Grandmother    Hyperlipidemia Maternal Grandmother    Thyroid disease Maternal Grandmother    Kidney disease Maternal Grandmother    Diabetes Father    Hypertension Mother    Hyperlipidemia Mother    COPD Paternal Grandfather    Heart disease Maternal Grandfather    Diabetes Maternal Grandfather    Hypertension Brother    Hyperlipidemia Brother      Current Facility-Administered Medications:    0.9 %  sodium chloride infusion, , Intravenous, PRN, Kalman Shan, MD, Stopped at 02/08/21 0124   0.9 %  sodium chloride infusion, 250 mL, Intravenous, Continuous, Ogan, Okoronkwo U, MD, Paused at 02/08/21 0434   0.9 %  sodium chloride infusion, , Intravenous, PRN, Jousha Schwandt, Lesia Sago, MD, Stopped at 02/08/21 0152   [EXPIRED] amiodarone (NEXTERONE PREMIX) 360-4.14 MG/200ML-% (1.8 mg/mL) IV infusion, 60 mg/hr, Intravenous, Continuous, Stopped at 02/08/21 0052 **FOLLOWED BY** amiodarone (NEXTERONE PREMIX) 360-4.14 MG/200ML-% (1.8 mg/mL) IV infusion, 30 mg/hr, Intravenous, Continuous, Bensimhon, Bevelyn Buckles, MD, Last Rate: 16.67 mL/hr at 02/08/21 0738, 30 mg/hr at 02/08/21 0738   cefTRIAXone (ROCEPHIN) 1 g in sodium chloride 0.9 % 100 mL IVPB, 1 g, Intravenous, Q24H, Sakeenah Valcarcel, Lesia Sago, MD, Paused at 02/07/21 1025   Chlorhexidine Gluconate Cloth 2 % PADS 6 each, 6 each, Topical, Daily, Kalman Shan, MD, 6 each at 02/07/21 0955   digoxin (LANOXIN) 0.25 MG/ML injection 0.125 mg, 0.125 mg, Intravenous, Daily,  Migdalia Dk, MD   doxycycline (VIBRAMYCIN) 100 mg in sodium chloride 0.9 % 250 mL IVPB, 100 mg, Intravenous, Q12H, Ogan, Okoronkwo U, MD, Stopped at 02/08/21 0342   lip balm (CARMEX) ointment, , Topical, PRN, Ogan, Okoronkwo U, MD   milrinone  (PRIMACOR) 20 MG/100 ML (0.2 mg/mL) infusion, 0.25 mcg/kg/min, Intravenous, Continuous, Duayne Cal, NP, Last Rate: 7.87 mL/hr at 02/08/21 0600, 0.25 mcg/kg/min at 02/08/21 0600   norepinephrine (LEVOPHED) 4mg  in premix infusion, 2-10 mcg/min, Intravenous, Titrated, Ogan, Okoronkwo U, MD, Paused at 02/07/21 1054   ondansetron (ZOFRAN) injection 4 mg, 4 mg, Intravenous, Q6H PRN, Landis Cassaro, Lesia Sago, MD, 4 mg at 02/07/21 1050   potassium chloride 10 mEq in 50 mL *CENTRAL LINE* IVPB, 10 mEq, Intravenous, Q1 Hr x 6, Ogan, Okoronkwo U, MD, Last Rate: 50 mL/hr at 02/08/21 0705, 10 mEq at 02/08/21 1610   sodium chloride flush (NS) 0.9 % injection 10-40 mL, 10-40 mL, Intracatheter, Q12H, Soriyah Osberg, Lesia Sago, MD, 10 mL at 02/07/21 2155   sodium chloride flush (NS) 0.9 % injection 10-40 mL, 10-40 mL, Intracatheter, PRN, Hallie Ertl, Lesia Sago, MD   spironolactone (ALDACTONE) tablet 12.5 mg, 12.5 mg, Oral, Daily, Bensimhon, Bevelyn Buckles, MD, 12.5 mg at 02/07/21 1801   Significant Hospital Events: Including procedures, antibiotic start and stop dates in addition to other pertinent events   6/27 admit to [place] for hemotpysis. Dx PE. Worsening hemoptysis  6/28 tx to [facility] > then to ICU due to hypotension, hemoptysis.  6/29 milrinone, NE, UOP picking up, PICC placed 6/30 hemoptysis improving but present, Cr better, feels better, on milrinone  Interim History / Subjective:  NAEON. Hemoptysis improved subjectively, Cr better. Feels better.  Objective   Blood pressure (!) 93/56, pulse (!) 122, temperature 98.5 F (36.9 C), temperature source Oral, resp. rate (!) 37, height 5' 4.5" (1.638 m), weight 104.9 kg, last menstrual period 10/24/2020, SpO2 97 %. CVP:  [2 mmHg-15 mmHg] 12 mmHg      Intake/Output Summary (Last 24 hours) at 02/08/2021 0823 Last data filed at 02/08/2021 0600 Gross per 24 hour  Intake 2071.68 ml  Output 1355 ml  Net 716.68 ml    Filed Weights   02/06/21 2015 02/07/21 0630  02/08/21 0500  Weight: 104.9 kg 103.6 kg 104.9 kg    Examination: General: Overweight female in NAD HENT: [place]/AT, PERRL, JVP 3-4 cm above clavicle with HOB ~45-60 degrees Lungs: Coarse bases, no distress. Sats in the high 90s on room air.  Cardiovascular: Tachy, regular, no MRG. 3+ pitting edema in bilateral lower extremities to the knees.  Abdomen: Soft, non-tender, non-distended. Extremities: Edema as above, no acute deformity or ROM limitations.  Neuro: Alert, oriented, non-focal.    LABS    PULMONARY Recent Labs  Lab 02/07/21 0340 02/07/21 1559  O2SAT 75.0 63.1    CBC Recent Labs  Lab 02/06/21 2008 02/06/21 2018 02/07/21 0226 02/08/21 0334  HGB 12.6  --  13.5 9.8*  HCT 38.3  --  41.1 30.6*  WBC 17.5*  --  17.0* 19.6*  PLT 156 172 150 145*     COAGULATION Recent Labs  Lab 02/06/21 2008 02/06/21 2018 02/07/21 1808  INR 1.9* 1.9* 1.8*     CARDIAC  No results for input(s): TROPONINI in the last 168 hours. No results for input(s): PROBNP in the last 168 hours.   CHEMISTRY Recent Labs  Lab 02/06/21 2008 02/07/21 0226 02/07/21 1808 02/08/21 0334  NA 131* 136 132* 135  K 4.0 3.4*  3.1* 3.1*  CL 96* 97* 94* 101  CO2 18* 24 24 22   GLUCOSE 135* 113* 319* 161*  BUN 21* 21* 18 17  CREATININE 1.13* 1.13* 1.05* 0.99  CALCIUM 7.9* 8.1* 6.9* 6.7*  MG 1.6*  --  1.5* 1.7    Estimated Creatinine Clearance: 96.3 mL/min (by C-G formula based on SCr of 0.99 mg/dL).   LIVER Recent Labs  Lab 02/06/21 2008 02/06/21 2018 02/07/21 1808  AST 39  --   --   ALT 55*  --   --   ALKPHOS 61  --   --   BILITOT 3.5*  --   --   PROT 5.2*  --   --   ALBUMIN 2.4*  --   --   INR 1.9* 1.9* 1.8*      INFECTIOUS Recent Labs  Lab 02/06/21 1919 02/06/21 2017 02/07/21 0226 02/07/21 0542 02/07/21 2155 02/08/21 0334 02/08/21 4782  LATICACIDVEN  --    < >  --  2.3* 2.2*  --  1.9  PROCALCITON 2.31  --  2.54  --   --  1.68  --    < > = values in this interval not  displayed.      ENDOCRINE CBG (last 3)  Recent Labs    02/06/21 2022  GLUCAP 134*          IMAGING x48h  - image(s) personally visualized  -   highlighted in bold CT CHEST WO CONTRAST  Result Date: 02/06/2021 CLINICAL DATA:  Hemoptysis EXAM: CT CHEST WITHOUT CONTRAST TECHNIQUE: Multidetector CT imaging of the chest was performed following the standard protocol without IV contrast. COMPARISON:  None. FINDINGS: Cardiovascular: Heart is normal size. Aorta is normal caliber. Mediastinum/Nodes: Soft tissue in the anterior mediastinum felt represent residual thymus. No mediastinal, hilar, or axillary adenopathy. Trachea and esophagus are unremarkable. Thyroid unremarkable. Lungs/Pleura: Dense consolidation in the right lower lobe. Ground-glass airspace opacities in the right middle lobe. Consolidation also noted in the left lower lobe. Findings compatible with pneumonia. No effusions. Upper Abdomen: Imaging into the upper abdomen demonstrates no acute findings. Musculoskeletal: Chest wall soft tissues are unremarkable. No acute bony abnormality. IMPRESSION: Areas of consolidation in the right lower lobe, left lower lobe and right middle lobe compatible with multifocal pneumonia. Electronically Signed   By: Charlett Nose M.D.   On: 02/06/2021 22:35   DG CHEST PORT 1 VIEW  Result Date: 02/06/2021 CLINICAL DATA:  35 year old female with hemoptysis. EXAM: PORTABLE CHEST 1 VIEW COMPARISON:  None. FINDINGS: Focal right lung base opacity may represent atelectasis but concerning for pneumonia. Clinical correlation is recommended. The left lung is clear. No pleural effusion pneumothorax. There is mild cardiomegaly. No acute osseous pathology. IMPRESSION: Right lung base opacity concerning for pneumonia. Electronically Signed   By: Elgie Collard M.D.   On: 02/06/2021 19:44   VAS [place] LOWER EXTREMITY VENOUS (DVT)  Result Date: 02/07/2021  Lower Venous DVT Study Patient Name:  VALIA WINGARD  Date of  Exam:   02/07/2021 Medical Rec #: [id number]      Accession #:    [id number] Date of Birth: 05/31/1986      Patient Gender: F Patient Age:   70Y Exam Location:  [facility] Procedure:      VAS [place] LOWER EXTREMITY VENOUS (DVT) Referring Phys: [id number] MURALI RAMASWAMY --------------------------------------------------------------------------------  Indications: Edema.  Limitations: Body habitus. Comparison Study: no prior Performing Technologist: [place] Ponder RVS  Examination Guidelines: A complete evaluation includes B-mode imaging, spectral Doppler,  color Doppler, and power Doppler as needed of all accessible portions of each vessel. Bilateral testing is considered an integral part of a complete examination. Limited examinations for reoccurring indications may be performed as noted. The reflux portion of the exam is performed with the patient in reverse Trendelenburg.  +---------+---------------+---------+-----------+----------+-------------------+ RIGHT    CompressibilityPhasicitySpontaneityPropertiesThrombus Aging      +---------+---------------+---------+-----------+----------+-------------------+ CFV      Full           Yes      Yes                                      +---------+---------------+---------+-----------+----------+-------------------+ SFJ      Full                                                             +---------+---------------+---------+-----------+----------+-------------------+ FV Prox  Full                                                             +---------+---------------+---------+-----------+----------+-------------------+ FV Mid   Full                                                             +---------+---------------+---------+-----------+----------+-------------------+ FV Distal               Yes      Yes                                      +---------+---------------+---------+-----------+----------+-------------------+ PFV       Full                                                             +---------+---------------+---------+-----------+----------+-------------------+ POP      Full           Yes      Yes                                      +---------+---------------+---------+-----------+----------+-------------------+ PTV      Full                                                             +---------+---------------+---------+-----------+----------+-------------------+ PERO  Not well visualized +---------+---------------+---------+-----------+----------+-------------------+   +---------+---------------+---------+-----------+----------+-------------------+ LEFT     CompressibilityPhasicitySpontaneityPropertiesThrombus Aging      +---------+---------------+---------+-----------+----------+-------------------+ CFV      Full           Yes      Yes                                      +---------+---------------+---------+-----------+----------+-------------------+ SFJ      Full                                                             +---------+---------------+---------+-----------+----------+-------------------+ FV Prox  Full                                                             +---------+---------------+---------+-----------+----------+-------------------+ FV Mid                  Yes      Yes                                      +---------+---------------+---------+-----------+----------+-------------------+ FV Distal               Yes      Yes                                      +---------+---------------+---------+-----------+----------+-------------------+ PFV      Full                                                             +---------+---------------+---------+-----------+----------+-------------------+ POP      Full           Yes      Yes                                       +---------+---------------+---------+-----------+----------+-------------------+ PTV      Full                                                             +---------+---------------+---------+-----------+----------+-------------------+ PERO                                                  Not well visualized +---------+---------------+---------+-----------+----------+-------------------+     Summary: BILATERAL: -  No evidence of deep vein thrombosis seen in the lower extremities, bilaterally. -No evidence of popliteal cyst, bilaterally.   *See table(s) above for measurements and observations. Electronically signed by Gretta Began MD on 02/07/2021 at 2:57:05 PM.    Final    ECHOCARDIOGRAM LIMITED  Result Date: 02/06/2021    ECHOCARDIOGRAM REPORT   Patient Name:   JERRINE URSCHEL Date of Exam: 02/06/2021 Medical Rec #:  [id number]     Height:       64.5 in Accession #:    [id number]    Weight:       227.1 lb Date of Birth:  23-Jul-1986     BSA:          2.076 m Patient Age:    35 years      BP:           109/88 mmHg Patient Gender: F             HR:           132 bpm. Exam Location:  Inpatient Procedure: Limited Echo, Limited Color Doppler and Cardiac Doppler STAT ECHO Indications:    Cardiomyopathy  History:        Patient has prior history of Echocardiogram examinations. CHF;                 Signs/Symptoms:Hemoptysis.  Sonographer:    Delcie Roch Referring Phys: [id number] MURALI RAMASWAMY IMPRESSIONS  1. Left ventricular ejection fraction, by estimation, is 10-15%. The left ventricle has severely decreased function. The left ventricle demonstrates global hypokinesis. The left ventricular internal cavity size was moderately dilated. Left ventricular diastolic function could not be evaluated.  2. Right ventricular systolic function is moderate-severely reduced. The right ventricular size is moderately enlarged. There is moderately elevated pulmonary artery systolic pressure. The estimated right ventricular  systolic pressure is 45.9 mmHg.  3. Left atrial size was mildly dilated.  4. The mitral valve is grossly normal. Severe, secondary mitral valve regurgitation with leaflet tenting secondary to LV dysfunction. No evidence of mitral stenosis.  5. Tricuspid valve regurgitation is severe.  6. The aortic valve is normal in structure. Aortic valve regurgitation is not visualized.  7. The inferior vena cava is normal in size with <50% respiratory variability, suggesting right atrial pressure of 8 mmHg. FINDINGS  Left Ventricle: Left ventricular ejection fraction, by estimation, is 10-15%. The left ventricle has severely decreased function. The left ventricle demonstrates global hypokinesis. The left ventricular internal cavity size was moderately dilated. There  is no left ventricular hypertrophy. Left ventricular diastolic function could not be evaluated. Right Ventricle: The right ventricular size is moderately enlarged. No increase in right ventricular wall thickness. Right ventricular systolic function is moderate-severely reduced. There is moderately elevated pulmonary artery systolic pressure. The tricuspid regurgitant velocity is 3.08 m/s, and with an assumed right atrial pressure of 8 mmHg, the estimated right ventricular systolic pressure is 45.9 mmHg. Left Atrium: Left atrial size was mildly dilated. Right Atrium: Right atrial size was normal in size. Pericardium: There is no evidence of pericardial effusion. Mitral Valve: The mitral valve is grossly normal. Severe mitral valve regurgitation. No evidence of mitral valve stenosis. MV peak gradient, 85.0 mmHg. Tricuspid Valve: The tricuspid valve is grossly normal. Tricuspid valve regurgitation is severe. Aortic Valve: The aortic valve is normal in structure. Aortic valve regurgitation is not visualized. Pulmonic Valve: The pulmonic valve was not well visualized. Aorta: The aortic root is normal in size and structure. Pulmonary Artery: The pulmonary  artery is of  normal size. Venous: The inferior vena cava is normal in size with less than 50% respiratory variability, suggesting right atrial pressure of 8 mmHg. IAS/Shunts: No atrial level shunt detected by color flow Doppler.  LEFT VENTRICLE PLAX 2D LVIDd:         6.00 cm  Diastology LVIDs:         5.40 cm  LV e' medial:  6.85 cm/s LV PW:         1.10 cm  LV e' lateral: 12.90 cm/s LV IVS:        0.90 cm LVOT diam:     2.00 cm LV SV:         18 LV SV Index:   9 LVOT Area:     3.14 cm  IVC IVC diam: 2.20 cm LEFT ATRIUM           Index LA diam:      5.00 cm 2.41 cm/m LA Vol (A4C): 79.3 ml 38.20 ml/m  AORTIC VALVE LVOT Vmax:   48.40 cm/s LVOT Vmean:  31.300 cm/s LVOT VTI:    0.058 m  AORTA Ao Asc diam: 2.30 cm MITRAL VALVE                 TRICUSPID VALVE MV Peak grad: 85.0 mmHg      TR Peak grad:   37.9 mmHg MV Vmax:      4.61 m/s       TR Vmax:        308.00 cm/s MR Peak grad:    91.4 mmHg MR Mean grad:    57.0 mmHg   SHUNTS MR Vmax:         478.00 cm/s Systemic VTI:  0.06 m MR Vmean:        365.0 cm/s  Systemic Diam: 2.00 cm MR PISA:         1.27 cm MR PISA Eff ROA: 11 mm MR PISA Radius:  0.45 cm Weston BrassGayatri Acharya MD Electronically signed by Weston BrassGayatri Acharya MD Signature Date/Time: 02/06/2021/9:39:14 PM    Final    US EKG SITE RITE  Result Date: 02/06/2021 If Site Rite image not attached, placement could not be confirmed due to current cardiac rhythm.    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 Severely reduced RV systolic function. Severe secondary MR. BNP 1500. (POA) Core is warm, distal extremities cool. Suspect subacute low flow state due to CHF yielded GI symptoms, UTI may be true or red herring (UA dirty, no culture). N/V preceded cough. Cough started  Cough ever since, junky sputum, recent development hemoptysis. Led to presentation and discovery of BiV failure,  subacute nature fits a bit with relative compensation. - HF team following appreciate assistance -Diuresis per Cardiology, JVP elevated - Milrinone 0.25 mg/hr, NE low dose to support BP - Co-ox low 60s  Hemoptysis (POA): Suspect related to pulmonary infiltrates and coagulopathy with likely contribution of elevated PV pressures in setting of volume overload and severe MR. Possible related to PE seen at OSH ([facility]). Images with alveolar filling on right, unclear if DAH vs aspirated blood from other cause of hemoptysis. Cardiogenic shock, not stable for diagnostic bronchoscopy. Quantity improved with Vit K per patient report. - Autoimmune, Vasculitis workup from 6/28  pending - Quant Gold 6/28 - Urine strep negative, MRSA PCR negative - urine pregnancy ordered -  Correct INR - s/p IV Vit K 10 mg x1, oral vit K ordered - Consider empiric steroids if respiratory status worsens, favor holding off in case she can tolerate diagnostic procedure in the future  Pulmonary emobli per report from outside hospital [facility]): segmental and subsegmental PE  - Cannot anticoagulate at this time due to hemoptysis +trial of systemic heparin made things worse at OSH -correct coagulopathy - LE dopplers negative  AKI (POA) Improved with diuresis, inotrope.  - monitor with cardiac Rx  Congestive Hepatopathy: POA. LFTs, bili elevated. INR 1.9. --HF therapies as above  Elevated INR: POA. suspect related to congestive hepatopathy but nutritional deficiency considered. --s/p IV Vit k 10 mg --Continue PO Vit K --Repeat INR  Elevated anion gap metabolic lactic acidosis: POA. lactic acid 4.5 - present on admission --due to cardiogenic shock, resolved  Hypothyroidism: POA. TSH 13 and t4 mildly elevated as well on admission. P - Hold home synthroid for now.  Acute hypoxemic resp failure -POA.  present on admission due to above. Suspect due to pulmonary infiltrates. On 2L [place].  -goal pulse ox > 88%  CAP: POA. dense  bilateral LL and RML infiltrates. MRSA swab negative. --CTX/doxy x 5 days  GGOs: POA. On CT. Suspect aspirated blood. DAH possible. Denser infiltrates not typical for vasculitis. Sed rate normal. --CRP elevated sed rate WNL --work up as above for hemoptysis  Microscopic Hematuria: Presumed due to coagulopathy. Vasculitis possible. --Repeat UA once INR corrected  Best Practice (right click and "Reselect all SmartList Selections" daily)   Diet/type: NPO and NPO w/ oral meds DVT prophylaxis: SCD GI prophylaxis: PPI Lines: N/A, Central line, and yes and it is still needed Foley:  Yes, and it is still needed Code Status:  full code Last date of multidisciplinary goals of care discussion [ 6/28]  CRITICAL CARE Performed by: Karren Burly   Total critical care time: 35 minutes  Critical care time was exclusive of separately billable procedures and treating other patients.  Critical care was necessary to treat or prevent imminent or life-threatening deterioration.  Critical care was time spent personally by me on the following activities: development of treatment plan with patient and/or surrogate as well as nursing, discussions with consultants, evaluation of patient's response to treatment, examination of patient, obtaining history from patient or surrogate, ordering and performing treatments and interventions, ordering and review of laboratory studies, ordering and review of radiographic studies, pulse oximetry and re-evaluation of patient's condition.

## 2021-02-08 NOTE — Progress Notes (Addendum)
Advanced Heart Failure Rounding Note  PCP-Cardiologist: None   Subjective:   -05/13: Seen in ED for recurrent nausea and vomiting. Treated for possible UTI. Ongoing weakness, fatigue, intermittent N/V, cough since. -06/27: ED at OSH with hemoptysis. Found to have segmental PE and pneumonia. EF 25-30%. -06/28: Transferred to [facility]. Cardiogenic shock > milrinone 0.25 mcg. Diuresed with IV lasix. -06/29: Tachycardic. MAT noted on tele. Added digoxin and spiro. Amio added d/t NSVT. Last dose IV lasix. Norepi off.  Echo 02/06/2021: LVEF 10-15%, LV moderately dilated, RV systolic function severely reduced, RV moderately enlarged, RVSP 45.9 mmHg, severe secondary MR, severe TR.   Reports feeling better today. Less hemoptysis. Had some dyspnea overnight which improved with nebulizer treatment. No orthopnea or PND. Had some nausea this am but now improved and was able to eat some breakfast.  Objective:   Weight Range: 104.9 kg Body mass index is 39.08 kg/m.   Vital Signs:   Temp:  [97.7 F (36.5 C)-98.5 F (36.9 C)] 98.5 F (36.9 C) (06/30 0715) Pulse Rate:  [112-135] 123 (06/30 0845) Resp:  [14-47] 36 (06/30 0845) BP: (80-120)/(50-89) 92/67 (06/30 0845) SpO2:  [78 %-100 %] 95 % (06/30 0845) Weight:  [104.9 kg] 104.9 kg (06/30 0500) Last BM Date: 02/06/21  Weight change: Filed Weights   02/06/21 2015 02/07/21 0630 02/08/21 0500  Weight: 104.9 kg 103.6 kg 104.9 kg    Intake/Output:   Intake/Output Summary (Last 24 hours) at 02/08/2021 0903 Last data filed at 02/08/2021 0600 Gross per 24 hour  Intake 2052.54 ml  Output 1355 ml  Net 697.54 ml      Physical Exam   CVP 15 General:  No resp difficulty. 02 sats upper 90s on 1L [place]. HEENT: Normal Neck: Supple. + JVD. Carotids 2+ bilat; no bruits. No lymphadenopathy or thyromegaly appreciated. Cor: PMI nondisplaced. Tachycardic. No rubs, gallops or murmurs. Lungs: Bibasilar crackles. Expiratory wheezes noted. Abdomen: Soft,  nontender, nondistended. No hepatosplenomegaly. No bruits or masses. Good bowel sounds. Extremities: No cyanosis, clubbing, rash, 2-3+ bilateral lower extremity edema up to thighs Neuro: Alert & orientedx3, cranial nerves grossly intact. moves all 4 extremities w/o difficulty. Affect pleasant   Telemetry   Sinus tachycardia rates 120s. Short runs of NSVT up to 7 beats overnight  Labs    CBC Recent Labs    02/07/21 0226 02/08/21 0334  WBC 17.0* 19.6*  NEUTROABS  --  16.4*  HGB 13.5 9.8*  HCT 41.1 30.6*  MCV 81.2 81.6  PLT 150 145*   Basic Metabolic Panel Recent Labs    02/58/52 1808 02/08/21 0334  NA 132* 135  K 3.1* 3.1*  CL 94* 101  CO2 24 22  GLUCOSE 319* 161*  BUN 18 17  CREATININE 1.05* 0.99  CALCIUM 6.9* 6.7*  MG 1.5* 1.7   Liver Function Tests Recent Labs    02/06/21 2008  AST 39  ALT 55*  ALKPHOS 61  BILITOT 3.5*  PROT 5.2*  ALBUMIN 2.4*   No results for input(s): LIPASE, AMYLASE in the last 72 hours. Cardiac Enzymes No results for input(s): CKTOTAL, CKMB, CKMBINDEX, TROPONINI in the last 72 hours.  BNP: BNP (last 3 results) Recent Labs    02/06/21 2018  BNP 1,571.8*    ProBNP (last 3 results) No results for input(s): PROBNP in the last 8760 hours.   D-Dimer Recent Labs    02/06/21 2018  DDIMER 10.13*   Hemoglobin A1C No results for input(s): HGBA1C in the last 72 hours. Fasting  Lipid Panel No results for input(s): CHOL, HDL, LDLCALC, TRIG, CHOLHDL, LDLDIRECT in the last 72 hours. Thyroid Function Tests Recent Labs    02/06/21 2019  TSH 13.590*    Other results:   Imaging    VAS [place] LOWER EXTREMITY VENOUS (DVT)  Result Date: 02/07/2021  Lower Venous DVT Study Patient Name:  IVOREE KRUMWIEDE  Date of Exam:   02/07/2021 Medical Rec #: [id number]      Accession #:    [id number] Date of Birth: [birth year]      Patient Gender: F Patient Age:   63Y Exam Location:  [facility] Procedure:      VAS [place] LOWER EXTREMITY VENOUS  (DVT) Referring Phys: [id number] MURALI RAMASWAMY --------------------------------------------------------------------------------  Indications: Edema.  Limitations: Body habitus. Comparison Study: no prior Performing Technologist: [place] Ponder RVS  Examination Guidelines: A complete evaluation includes B-mode imaging, spectral Doppler, color Doppler, and power Doppler as needed of all accessible portions of each vessel. Bilateral testing is considered an integral part of a complete examination. Limited examinations for reoccurring indications may be performed as noted. The reflux portion of the exam is performed with the patient in reverse Trendelenburg.  +---------+---------------+---------+-----------+----------+-------------------+ RIGHT    CompressibilityPhasicitySpontaneityPropertiesThrombus Aging      +---------+---------------+---------+-----------+----------+-------------------+ CFV      Full           Yes      Yes                                      +---------+---------------+---------+-----------+----------+-------------------+ SFJ      Full                                                             +---------+---------------+---------+-----------+----------+-------------------+ FV Prox  Full                                                             +---------+---------------+---------+-----------+----------+-------------------+ FV Mid   Full                                                             +---------+---------------+---------+-----------+----------+-------------------+ FV Distal               Yes      Yes                                      +---------+---------------+---------+-----------+----------+-------------------+ PFV      Full                                                             +---------+---------------+---------+-----------+----------+-------------------+  POP      Full           Yes      Yes                                       +---------+---------------+---------+-----------+----------+-------------------+ PTV      Full                                                             +---------+---------------+---------+-----------+----------+-------------------+ PERO                                                  Not well visualized +---------+---------------+---------+-----------+----------+-------------------+   +---------+---------------+---------+-----------+----------+-------------------+ LEFT     CompressibilityPhasicitySpontaneityPropertiesThrombus Aging      +---------+---------------+---------+-----------+----------+-------------------+ CFV      Full           Yes      Yes                                      +---------+---------------+---------+-----------+----------+-------------------+ SFJ      Full                                                             +---------+---------------+---------+-----------+----------+-------------------+ FV Prox  Full                                                             +---------+---------------+---------+-----------+----------+-------------------+ FV Mid                  Yes      Yes                                      +---------+---------------+---------+-----------+----------+-------------------+ FV Distal               Yes      Yes                                      +---------+---------------+---------+-----------+----------+-------------------+ PFV      Full                                                             +---------+---------------+---------+-----------+----------+-------------------+ POP      Full  Yes      Yes                                      +---------+---------------+---------+-----------+----------+-------------------+ PTV      Full                                                              +---------+---------------+---------+-----------+----------+-------------------+ PERO                                                  Not well visualized +---------+---------------+---------+-----------+----------+-------------------+     Summary: BILATERAL: - No evidence of deep vein thrombosis seen in the lower extremities, bilaterally. -No evidence of popliteal cyst, bilaterally.   *See table(s) above for measurements and observations. Electronically signed by Gretta Beganodd Early MD on 02/07/2021 at 2:57:05 PM.    Final      Medications:     Scheduled Medications:  Chlorhexidine Gluconate Cloth  6 each Topical Daily   digoxin  0.125 mg Intravenous Daily   phytonadione  10 mg Oral Daily   sodium chloride flush  10-40 mL Intracatheter Q12H   spironolactone  12.5 mg Oral Daily    Infusions:  sodium chloride Stopped (02/08/21 0124)   sodium chloride Stopped (02/08/21 0434)   sodium chloride Stopped (02/08/21 0152)   amiodarone 30 mg/hr (02/08/21 0738)   cefTRIAXone (ROCEPHIN)  IV 1 g (02/08/21 0834)   doxycycline (VIBRAMYCIN) IV Stopped (02/08/21 0342)   milrinone 0.25 mcg/kg/min (02/08/21 0600)   norepinephrine (LEVOPHED) Adult infusion Stopped (02/07/21 1054)   potassium chloride 10 mEq (02/08/21 0826)    PRN Medications: sodium chloride, sodium chloride, lip balm, ondansetron (ZOFRAN) IV, sodium chloride flush    Assessment/Plan   Acute systolic HF -> Cardiogenic shock/acute biventricular heart failure/new cardiomyopathy -New diagnosis of CHF this admission after presenting with hemoptysis at OSH in [place]. Suspected viral illness with > 1 month hx weakness, fatigue, cough, N/V. HIV negative. No FHx.  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 -BNP 1,571 -Currently on 0.25 mcg milrinone. Lactic acid 4.3 > 2.3 > 1.9. CVP 9 this am, 15 this afternoon. CO-OX 63.1  > 61.5 -Last dose IV lasix 06/29. Mag 1.7, K 3.1. Replacing. Creatinine stable. -Significant lower extremity edema up to thighs. Weight up 3 lb but not sure if reliable. Una boots ordered. Gave 40 mg lasix IV with minimal output. Will give 60 mg IV lasix bolus and initiate furosemide gtt at 10 ml/hr. Hypotensive. Start midodrine 2.5 mg TID. Add norepi if needed to keep SBP > 90. Monitor renal function/electrolytes closely. -Albumin 2.4/total protein 5.2/total bili 3.5 06/28. Hepatic panel tomorrow am. -Continue dig and spiro. No beta blocker or entresto due to shock. -Will need cardiac MRI once more stable. -If has hemodynamic deterioration, ECMO support likely only option given degree of RV dysfunction.  2. Mitral valve regurgitation -Severe on echo this admission.  -Likely secondary.  3. Severe TR -Likely secondary  4. NSVT -  Several runs on tele yesterday evening (up to 22 beats). -IV amio. - Keep K> 4.0 Mg > 2.0  5. Hemoptysis/possible PE/community acquired pneumonia -Possible PE noted on imaging at OSH. Suspect course of events likely viral illness/myocardits > inactivity, followed by PE and pneumonia. Venous duplex negative for DVT. -Worsening hemoptysis after IV heparin. Unable to anticoagulate.  -OCP D/Cd. -CT chest here with consolidation both lower lobes. Ground glass opacities right middle lobe.PCT 2.6  -On antibiotics for CAP. WBC 17.0. > 19.6. -Given volume of hemoptysis it is likely not related to HF. Possibly due to PNA. Awaiting CTD serologies. -Improving today.  6. Hypothyroidism -Recently diagnosed. -TSH 13.5/Free T4 1.39. -On synthroid PTA.  7. Elevated INR -Possibly due to passive congestion from CHF. -Given VitK per CCM.  8. Hyponatremia -Na 131 > 136 > 132 > 135. - Restrict free water  9. Acute blood loss anemia -Hgb 12.6 > 13.5 > 9.8. -Presented with massive hemoptysis which is now improving.  DISCHARGE PLANNING: No current medical coverage. Will  need to be enrolled in patient assistance and HF fund for brand name medications. HF PharmD aware. Social work notified.  Length of Stay: 2  FINCH, LINDSAY N, PA-C  02/08/2021, 9:03 AM  Advanced Heart Failure Team Pager [phone] (M-F; 7a - 5p)  Please contact [facility] for night-coverage after hours (5p -7a ) and weekends on [email or web address]  Agree with above.   Had NSVT and more SOB overnight. Remains on milrinone and IV amio. SBP in 80. MAPs 60-70s. Co-ox 61% CVP 13 (checked personally). Hemoptysis has slowed.   General:  Lying in bed. Mildly SOB  HEENT: normal Neck: supple. JVP to jaw  Carotids 2+ bilat; no bruits. No lymphadenopathy or thryomegaly appreciated. Cor: PMI nondisplaced. Regular tachy +s3   Lungs: clear Abdomen: obese soft, nontender, nondistended. No hepatosplenomegaly. No bruits or masses. Good bowel sounds. Extremities: no cyanosis, clubbing, rash, 2-3+ edema Neuro: alert & orientedx3, cranial nerves grossly intact. moves all 4 extremities w/o difficulty. Affect pleasant  Remains very tenuous despite milrinone. BP low. Tachycardic and volume overloaded.   Will add low-dose midodrine to support BP. Can add NE as needed. Will move to 2H for closer cardiac surveillance and in case she needs mechanical support. Switch to lasix gtt. Continue abx for PNA  CRITICAL CARE Performed by: Arvilla Meres  Total critical care time: 45 minutes  Critical care time was exclusive of separately billable procedures and treating other patients.  Critical care was necessary to treat or prevent imminent or life-threatening deterioration.  Critical care was time spent personally by me (independent of midlevel providers or residents) on the following activities: development of treatment plan with patient and/or surrogate as well as nursing, discussions with consultants, evaluation of patient's response to treatment, examination of patient, obtaining history from patient or surrogate,  ordering and performing treatments and interventions, ordering and review of laboratory studies, ordering and review of radiographic studies, pulse oximetry and re-evaluation of patient's condition.  Arvilla Meres, MD  6:50 PM

## 2021-02-08 NOTE — Progress Notes (Signed)
eLink Physician-Brief Progress Note Patient Name: Debra Daniel DOB: 15-Sep-1985 MRN: [id number]   Date of Service  02/08/2021  HPI/Events of Note  K+ 3.1, Mg++ 1.7  eICU Interventions  Electrolytes ordered repleted per E-Link electrolyte replacement protocol.        Debra Daniel 02/08/2021, 6:14 AM

## 2021-02-08 NOTE — Progress Notes (Signed)
Ortho Tech paged at [phone] awaiting a call back. Also tried [phone] rand back to 5N

## 2021-02-08 NOTE — Plan of Care (Signed)

## 2021-02-09 LAB — BASIC METABOLIC PANEL
Anion gap: 12 (ref 5–15)
BUN: 21 mg/dL — ABNORMAL HIGH (ref 6–20)
CO2: 22 mmol/L (ref 22–32)
Calcium: 7.5 mg/dL — ABNORMAL LOW (ref 8.9–10.3)
Chloride: 96 mmol/L — ABNORMAL LOW (ref 98–111)
Creatinine, Ser: 1.27 mg/dL — ABNORMAL HIGH (ref 0.44–1.00)
GFR, Estimated: 57 mL/min — ABNORMAL LOW (ref >60)
Glucose, Bld: 256 mg/dL — ABNORMAL HIGH (ref 70–99)
Potassium: 4.2 mmol/L (ref 3.5–5.1)
Sodium: 130 mmol/L — ABNORMAL LOW (ref 135–145)

## 2021-02-09 LAB — COOXEMETRY PANEL
Carboxyhemoglobin: 1.1 % (ref 0.5–1.5)
Carboxyhemoglobin: 1.2 % (ref 0.5–1.5)
Carboxyhemoglobin: 1.1 % (ref 0.5–1.5)
Carboxyhemoglobin: 1.2 % (ref 0.5–1.5)
Methemoglobin: 0.8 % (ref 0.0–1.5)
Methemoglobin: 1 % (ref 0.0–1.5)
Methemoglobin: 0.7 % (ref 0.0–1.5)
Methemoglobin: 0.7 % (ref 0.0–1.5)
O2 Saturation: 45.8 %
O2 Saturation: 42.2 %
O2 Saturation: 49.4 %
O2 Saturation: 48.1 %
Total hemoglobin: 10.2 g/dL — ABNORMAL LOW (ref 12.0–16.0)
Total hemoglobin: 10.1 g/dL — ABNORMAL LOW (ref 12.0–16.0)
Total hemoglobin: 11.4 g/dL — ABNORMAL LOW (ref 12.0–16.0)
Total hemoglobin: 10.4 g/dL — ABNORMAL LOW (ref 12.0–16.0)

## 2021-02-09 LAB — HEPATIC FUNCTION PANEL
ALT: 35 U/L (ref 0–44)
AST: 28 U/L (ref 15–41)
Albumin: 1.8 g/dL — ABNORMAL LOW (ref 3.5–5.0)
Alkaline Phosphatase: 48 U/L (ref 38–126)
Bilirubin, Direct: 1.1 mg/dL — ABNORMAL HIGH (ref 0.0–0.2)
Indirect Bilirubin: 1.3 mg/dL — ABNORMAL HIGH (ref 0.3–0.9)
Total Bilirubin: 2.4 mg/dL — ABNORMAL HIGH (ref 0.3–1.2)
Total Protein: 4.6 g/dL — ABNORMAL LOW (ref 6.5–8.1)

## 2021-02-09 LAB — ANCA TITERS
Atypical P-ANCA titer: <1:20 {titer}
C-ANCA: <1:20 {titer}
P-ANCA: <1:20 {titer}

## 2021-02-09 LAB — CBC
HCT: 28.9 % — ABNORMAL LOW (ref 36.0–46.0)
Hemoglobin: 9.2 g/dL — ABNORMAL LOW (ref 12.0–15.0)
MCH: 26.2 pg (ref 26.0–34.0)
MCHC: 31.8 g/dL (ref 30.0–36.0)
MCV: 82.3 fL (ref 80.0–100.0)
Platelets: 142 10*3/uL — ABNORMAL LOW (ref 150–400)
RBC: 3.51 MIL/uL — ABNORMAL LOW (ref 3.87–5.11)
RDW: 17 % — ABNORMAL HIGH (ref 11.5–15.5)
WBC: 19.6 10*3/uL — ABNORMAL HIGH (ref 4.0–10.5)
nRBC: 0.3 % — ABNORMAL HIGH (ref 0.0–0.2)

## 2021-02-09 LAB — HIV-1 RNA QUANT-NO REFLEX-BLD
HIV 1 RNA Quant: <20 copies/mL
LOG10 HIV-1 RNA: UNDETERMINED log10copy/mL

## 2021-02-09 LAB — CYCLIC CITRUL PEPTIDE ANTIBODY, IGG/IGA: CCP Antibodies IgG/IgA: 4 units (ref 0–19)

## 2021-02-09 LAB — GLUCOSE, CAPILLARY
Glucose-Capillary: 118 mg/dL — ABNORMAL HIGH (ref 70–99)
Glucose-Capillary: 125 mg/dL — ABNORMAL HIGH (ref 70–99)
Glucose-Capillary: 128 mg/dL — ABNORMAL HIGH (ref 70–99)
Glucose-Capillary: 132 mg/dL — ABNORMAL HIGH (ref 70–99)
Glucose-Capillary: 141 mg/dL — ABNORMAL HIGH (ref 70–99)
Glucose-Capillary: 141 mg/dL — ABNORMAL HIGH (ref 70–99)

## 2021-02-09 LAB — MAGNESIUM: Magnesium: 1.8 mg/dL (ref 1.7–2.4)

## 2021-02-09 LAB — LUPUS ANTICOAGULANT PANEL
DRVVT: 46.3 s (ref 0.0–47.0)
PTT Lupus Anticoagulant: 38.3 s (ref 0.0–51.9)

## 2021-02-09 LAB — LACTIC ACID, PLASMA: Lactic Acid, Venous: 3.4 mmol/L (ref 0.5–1.9)

## 2021-02-09 LAB — RHEUMATOID FACTOR: Rheumatoid fact SerPl-aCnc: <10 IU/mL (ref <14.0)

## 2021-02-09 LAB — PROTIME-INR
INR: 1.6 — ABNORMAL HIGH (ref 0.8–1.2)
Prothrombin Time: 18.6 seconds — ABNORMAL HIGH (ref 11.4–15.2)

## 2021-02-09 LAB — ANTI-SCLERODERMA ANTIBODY: Scleroderma (Scl-70) (ENA) Antibody, IgG: <0.2 AI (ref 0.0–0.9)

## 2021-02-09 LAB — GLOMERULAR BASEMENT MEMBRANE ANTIBODIES: GBM Ab: <0.2 units (ref 0.0–0.9)

## 2021-02-09 LAB — SJOGRENS SYNDROME-B EXTRACTABLE NUCLEAR ANTIBODY: SSB (La) (ENA) Antibody, IgG: <0.2 AI (ref 0.0–0.9)

## 2021-02-09 LAB — SJOGRENS SYNDROME-A EXTRACTABLE NUCLEAR ANTIBODY: SSA (Ro) (ENA) Antibody, IgG: <0.2 AI (ref 0.0–0.9)

## 2021-02-09 MED ORDER — INSULIN ASPART 100 UNIT/ML IJ SOLN
0.0000 [IU] | Freq: Three times a day (TID) | INTRAMUSCULAR | Status: DC
  Administered 2021-02-09 – 2021-02-20 (×7): 1 [IU] via SUBCUTANEOUS

## 2021-02-09 MED ORDER — TRANEXAMIC ACID FOR INHALATION
500.0000 mg | Freq: Three times a day (TID) | RESPIRATORY_TRACT | Status: AC
  Administered 2021-02-09 – 2021-02-11 (×5): 500 mg via RESPIRATORY_TRACT
  Filled 2021-02-09 (×6): qty 10

## 2021-02-09 MED ORDER — MAGNESIUM SULFATE 2 GM/50ML IV SOLN
2.0000 g | Freq: Once | INTRAVENOUS | Status: AC
  Administered 2021-02-09: 2 g via INTRAVENOUS
  Filled 2021-02-09: qty 50

## 2021-02-09 MED ORDER — ALTEPLASE 2 MG IJ SOLR
2.0000 mg | Freq: Once | INTRAMUSCULAR | Status: AC
  Administered 2021-02-09: 2 mg

## 2021-02-09 MED ORDER — INSULIN ASPART 100 UNIT/ML IJ SOLN: 0.0000 [IU] | Freq: Every day | INTRAMUSCULAR | Status: DC

## 2021-02-09 MED ORDER — MIDODRINE HCL 5 MG PO TABS
5.0000 mg | ORAL_TABLET | Freq: Three times a day (TID) | ORAL | Status: DC
  Administered 2021-02-09 – 2021-02-13 (×14): 5 mg via ORAL
  Filled 2021-02-09 (×14): qty 1

## 2021-02-09 NOTE — Progress Notes (Addendum)
Advanced Heart Failure Rounding Note  PCP-Cardiologist: None   Subjective:   -05/13: Seen in ED for recurrent nausea and vomiting. Treated for possible UTI. Ongoing weakness, fatigue, intermittent N/V, cough since. -06/27: ED at OSH with hemoptysis. Found to have segmental PE and pneumonia. EF 25-30%. -06/28: Transferred to [facility]. Cardiogenic shock > milrinone 0.25 mcg. Diuresed with IV lasix. -06/29: Tachycardic. MAT noted on tele. Added digoxin and spiro. Amio added d/t NSVT. Last dose IV lasix. Norepi off. -6/30: midodrine started and lasix drip started.   Remains on norepi 5 mcg + milrinone 0.25 mcg . CO-OX 45%.   Diuresing with lasix drip 10 mg per hour.  Sluggish urine output.   Still having some hemoptysis. Denies SOB.   Echo 02/06/2021: LVEF 10-15%, LV moderately dilated, RV systolic function severely reduced, RV moderately enlarged, RVSP 45.9 mmHg, severe secondary MR, severe TR.  Objective:   Weight Range: 104.9 kg Body mass index is 39.08 kg/m.   Vital Signs:   Temp:  [98 F (36.7 C)-98.7 F (37.1 C)] 98.7 F (37.1 C) (07/01 0700) Pulse Rate:  [63-134] 116 (07/01 0600) Resp:  [15-47] 29 (07/01 0600) BP: (70-124)/(47-89) 96/77 (07/01 0600) SpO2:  [85 %-100 %] 93 % (07/01 0600) Last BM Date: 02/06/21  Weight change: Filed Weights   02/06/21 2015 02/07/21 0630 02/08/21 0500  Weight: 104.9 kg 103.6 kg 104.9 kg    Intake/Output:   Intake/Output Summary (Last 24 hours) at 02/09/2021 0849 Last data filed at 02/09/2021 0400 Gross per 24 hour  Intake 2256.32 ml  Output 885 ml  Net 1371.32 ml      Physical Exam   CVP 12 General:  No resp difficulty. Appears pale.  HEENT: normal Neck: supple. JVP 11-12 . Carotids 2+ bilat; no bruits. No lymphadenopathy or thryomegaly appreciated. Cor: PMI nondisplaced. Regular rate & rhythm. No rubs, gallops or murmurs. Lungs: clear on 2 liters [place].  Abdomen: soft, nontender, nondistended. No hepatosplenomegaly. No bruits  or masses. Good bowel sounds. Extremities: no cyanosis, clubbing, rash, R and LLE 2 + edema. SCDs in place.  Neuro: alert & orientedx3, cranial nerves grossly intact. moves all 4 extremities w/o difficulty. Affect pleasant   Telemetry  ST with NSVT 120s   Labs    CBC Recent Labs    02/07/21 0226 02/08/21 0334  WBC 17.0* 19.6*  NEUTROABS  --  16.4*  HGB 13.5 9.8*  HCT 41.1 30.6*  MCV 81.2 81.6  PLT 150 145*   Basic Metabolic Panel Recent Labs    49/67/59 0334 02/08/21 1732 02/09/21 0314  NA 135 132* 130*  K 3.1* 4.8 4.2  CL 101 98 96*  CO2 22 23 22   GLUCOSE 161* 205* 256*  BUN 17 22* 21*  CREATININE 0.99 1.31* 1.27*  CALCIUM 6.7* 7.5* 7.5*  MG 1.7  --  1.8   Liver Function Tests Recent Labs    02/06/21 2008 02/09/21 0314  AST 39 28  ALT 55* 35  ALKPHOS 61 48  BILITOT 3.5* 2.4*  PROT 5.2* 4.6*  ALBUMIN 2.4* 1.8*   No results for input(s): LIPASE, AMYLASE in the last 72 hours. Cardiac Enzymes No results for input(s): CKTOTAL, CKMB, CKMBINDEX, TROPONINI in the last 72 hours.  BNP: BNP (last 3 results) Recent Labs    02/06/21 2018  BNP 1,571.8*    ProBNP (last 3 results) No results for input(s): PROBNP in the last 8760 hours.   D-Dimer Recent Labs    02/06/21 2018  DDIMER 10.13*  Hemoglobin A1C No results for input(s): HGBA1C in the last 72 hours. Fasting Lipid Panel No results for input(s): CHOL, HDL, LDLCALC, TRIG, CHOLHDL, LDLDIRECT in the last 72 hours. Thyroid Function Tests Recent Labs    02/06/21 2019  TSH 13.590*    Other results:   Imaging    No results found.   Medications:     Scheduled Medications:  Chlorhexidine Gluconate Cloth  6 each Topical Daily   digoxin  0.125 mg Oral Daily   insulin aspart  0-5 Units Subcutaneous QHS   insulin aspart  0-9 Units Subcutaneous TID WC   midodrine  2.5 mg Oral TID WC   phytonadione  10 mg Oral Daily   potassium chloride  40 mEq Oral BID   sodium chloride flush  10-40 mL  Intracatheter Q12H   spironolactone  12.5 mg Oral Daily    Infusions:  sodium chloride Stopped (02/08/21 1759)   sodium chloride Stopped (02/08/21 0434)   sodium chloride Stopped (02/08/21 2116)   amiodarone 30 mg/hr (02/09/21 0400)   cefTRIAXone (ROCEPHIN)  IV Stopped (02/08/21 1050)   doxycycline (VIBRAMYCIN) IV Stopped (02/08/21 2253)   furosemide (LASIX) 200 mg in dextrose 5% 100 mL (2mg /mL) infusion 10 mg/hr (02/09/21 0400)   milrinone 0.25 mcg/kg/min (02/09/21 0400)   norepinephrine (LEVOPHED) Adult infusion 5 mcg/min (02/09/21 0837)    PRN Medications: sodium chloride, sodium chloride, lip balm, ondansetron (ZOFRAN) IV, sodium chloride flush    Assessment/Plan   Acute systolic HF -> Cardiogenic shock/acute biventricular heart failure/new cardiomyopathy -New diagnosis of CHF this admission after presenting with hemoptysis at OSH in [place]. Suspected viral illness with > 1 month hx weakness, fatigue, cough, N/V. HIV negative. No FHx. Recently diagnosed with hypothyroidism -Echo here with EF of 10-15%, moderately dilated LV, moderate to severely reduced RV fxn with moderately dilated RV, RVSP 45 mmHg, severe secondary MR, severe TR. -BNP 1,571. Pregnancy test negative.  -Lactic acid 4.3 > 2.3 > 1.9. -Currently on 0.25 mcg milrinone + norepi 5 mcg.  CO-OX 46%--> repeat now. -6/30 switched to lasix drip. CVP 12. Sluggish urine output. Increase lasix drip to 15 mg per hour.  -Increase midodrine to 5 mg tid.  -Continue dig and spiro.  - No beta blocker or entresto due to shock. -Will need cardiac MRI once more stable.  2. Mitral valve regurgitation -Severe on echo this admission.  -Likely secondary.  3. Severe TR -Likely secondary  4. NSVT - Continues to have NSVT.  -Continue amio drip 30 mg per hour.  - Keep K> 4.0 Mg > 2.0 - K stable.  - Mag 1.8 . Give 2 grams Mag now.   5. Hemoptysis/possible PE/community acquired pneumonia -Possible PE noted on imaging at OSH.  Suspect course of events likely viral illness/myocardits > inactivity, followed by PE and pneumonia. Venous duplex negative for DVT. -Worsening hemoptysis after IV heparin. Unable to anticoagulate.  -CT chest here with consolidation both lower lobes. Ground glass opacities right middle lobe. - 6/29 Quantiferon TB test pending. On airborne precautions.  -PCT 2.6 , CRP 23.5  -On antibiotics for CAP.  -WBC 17.0. > 19.6> pending.   6. Hypothyroidism -Recently diagnosed. -TSH 13.5/Free T4 1.39. -On synthroid PTA.   7. Elevated INR -Possibly due to passive congestion from CHF. -Given Vit K 6/29, 6/30, and 7/1 per CCM. - Stopping vitamin K today.   8. Hyponatremia -Na 131 > 136 > 132 > 135>130  - Restrict free water  9. Acute blood loss anemia -Hgb 12.6 >  13.5 > 9.8>pending. Check daily CBC.  -Presented with massive hemoptysis which is now improving.   Check CBC. Repeat CO-OX now.--->CO-OX 42% will increase milrinone 0.375 mcg.   Consult PT.   No current medical coverage. Will need to be enrolled in patient assistance and HF fund for brand name medications. HF PharmD aware. Social work notified.  Length of Stay: 3  Amy Clegg, NP  02/09/2021, 8:49 AM  Advanced Heart Failure Team Pager [phone] (M-F; 7a - 5p)  Please contact [facility] for night-coverage after hours (5p -7a ) and weekends on [email or web address]   Agree with above.   She remains quite tenuous. NE added and milrinone increased to 0.375 and co-ox still only 49%. Tachycardia mildly improved but HR still 110-120. Continues with hemoptysis. CVP 12  General:  Obese woman sitting in chair. Tachypneic HEENT: normal Neck: supple. JVP 12  Carotids 2+ bilat; no bruits. No lymphadenopathy or thryomegaly appreciated. Cor: PMI nondisplaced. Regular tachy +s3  Lungs: coarse RLL Abdomen: obese soft, nontender, nondistended. No hepatosplenomegaly. No bruits or masses. Good bowel sounds. Extremities: no cyanosis, clubbing, rash,  2-3+ edema Neuro: alert & orientedx3, cranial nerves grossly intact. moves all 4 extremities w/o difficulty. Affect pleasant  She remains extremely tenuous despite dual pressors. In speaking with her mother further today she tells me that her mother and 4 of her aunts/uncles had HF. (No transplants). Suspect may have familial component.   I am worried she may be heading toward mechanical support however RV function is quite poor so Impella may not be feasible. May need IABP but not good candidate for [facility] with ongoing hemoptysis. Transplant likely not an option due to Body mass index is 39.08 kg/m. Will repeat lactic acid.   CRITICAL CARE Performed by: Arvilla Meres  Total critical care time: 45 minutes  Critical care time was exclusive of separately billable procedures and treating other patients.  Critical care was necessary to treat or prevent imminent or life-threatening deterioration.  Critical care was time spent personally by me (independent of midlevel providers or residents) on the following activities: development of treatment plan with patient and/or surrogate as well as nursing, discussions with consultants, evaluation of patient's response to treatment, examination of patient, obtaining history from patient or surrogate, ordering and performing treatments and interventions, ordering and review of laboratory studies, ordering and review of radiographic studies, pulse oximetry and re-evaluation of patient's condition.  Arvilla Meres, MD  4:39 PM

## 2021-02-09 NOTE — Progress Notes (Signed)
NAME:  Debra Daniel, MRN:  [id number], DOB:  1985/11/19, LOS: 3 ADMISSION DATE:  02/06/2021, CONSULTATION DATE:  6/28 REFERRING MD:  Dr. Adela Glimpse, CHIEF COMPLAINT:  hemoptysis   History of Present Illness:  35 year old female with no significant past medical history up until quite recently. Three months prior to admission she was diagnosed with hypothyroid and was started on synthroid. Then, approximately 3 months prior to presentation she developed UTI symptoms. She is unclear what medications she was treated with, although she remembers the first one was ineffective and a second antibiotic was needed. Progressive lower extremity edema since that time as well. Then 6/25 she began to experience mild hemoptysis, which prompted her to present to [facility] 6/27. Workup in the ED included CTA chest and CT abdomen/pelvis. She was found to have right sided segmental PE as well a crazy paving pattern on the CT chest. CT abdomen was non-acute. She was admitted. Started on heparin for PE, which caused her hemoptysis to become much worse. Heparin was stopped. Echocardiogram done demonstrated LVEF 25-30% as well as mod-severe mitral regurgitation. She was transferred to [facility] for further evaluation.   Pertinent  Medical History   has a past medical history of Encounter for menstrual regulation (01/24/2015), Hypothyroid, Migraines, Obesity, and UTI (urinary tract infection).    has a past medical history of Encounter for menstrual regulation (01/24/2015), Hypothyroid, Migraines, Obesity, and UTI (urinary tract infection).   reports that she has never smoked. She has never used smokeless tobacco.  Past Surgical History:  Procedure Laterality Date   broken collar bone  1994    Allergies  Allergen Reactions   Sulfa Antibiotics Hives   Ibuprofen Other (See Comments)    Constipation Can tolerate w milk of mag    Immunization History  Administered Date(s) Administered   Moderna  Sars-Covid-2 Vaccination 12/06/2019, 01/05/2020    Family History  Problem Relation Age of Onset   Hypertension Paternal Grandmother    Coronary artery disease Paternal Grandmother    Hypertension Maternal Grandmother    Diabetes Maternal Grandmother    Hyperlipidemia Maternal Grandmother    Thyroid disease Maternal Grandmother    Kidney disease Maternal Grandmother    Diabetes Father    Hypertension Mother    Hyperlipidemia Mother    COPD Paternal Grandfather    Heart disease Maternal Grandfather    Diabetes Maternal Grandfather    Hypertension Brother    Hyperlipidemia Brother      Current Facility-Administered Medications:    0.9 %  sodium chloride infusion, , Intravenous, PRN, Kalman Shan, MD, Stopped at 02/08/21 1759   0.9 %  sodium chloride infusion, 250 mL, Intravenous, Continuous, Ogan, Okoronkwo U, MD, Paused at 02/08/21 0434   0.9 %  sodium chloride infusion, , Intravenous, PRN, Hunsucker, Lesia Sago, MD, Stopped at 02/08/21 2116   [EXPIRED] amiodarone (NEXTERONE PREMIX) 360-4.14 MG/200ML-% (1.8 mg/mL) IV infusion, 60 mg/hr, Intravenous, Continuous, Stopped at 02/08/21 0052 **FOLLOWED BY** amiodarone (NEXTERONE PREMIX) 360-4.14 MG/200ML-% (1.8 mg/mL) IV infusion, 30 mg/hr, Intravenous, Continuous, Bensimhon, Bevelyn Buckles, MD, Last Rate: 16.67 mL/hr at 02/09/21 0800, 30 mg/hr at 02/09/21 0800   cefTRIAXone (ROCEPHIN) 1 g in sodium chloride 0.9 % 100 mL IVPB, 1 g, Intravenous, Q24H, Hunsucker, Lesia Sago, MD, Stopped at 02/08/21 1050   Chlorhexidine Gluconate Cloth 2 % PADS 6 each, 6 each, Topical, Daily, Kalman Shan, MD, 6 each at 02/08/21 0940   digoxin (LANOXIN) tablet 0.125 mg, 0.125 mg, Oral, Daily, Bensimhon, Reuel Boom  R, MD   doxycycline (VIBRAMYCIN) 100 mg in sodium chloride 0.9 % 250 mL IVPB, 100 mg, Intravenous, Q12H, Ogan, Thomasene Lot, MD, Stopped at 02/08/21 2253   furosemide (LASIX) 200 mg in dextrose 5 % 100 mL (2 mg/mL) infusion, 15 mg/hr, Intravenous,  Continuous, Clegg, Amy D, NP, Last Rate: 5 mL/hr at 02/09/21 0800, 10 mg/hr at 02/09/21 0800   insulin aspart (novoLOG) injection 0-5 Units, 0-5 Units, Subcutaneous, QHS, Ogan, Okoronkwo U, MD   insulin aspart (novoLOG) injection 0-9 Units, 0-9 Units, Subcutaneous, TID WC, Ogan, Okoronkwo U, MD, 1 Units at 02/09/21 0837   lip balm (CARMEX) ointment, , Topical, PRN, Ogan, Okoronkwo U, MD   magnesium sulfate IVPB 2 g 50 mL, 2 g, Intravenous, Once, Clegg, Amy D, NP   midodrine (PROAMATINE) tablet 2.5 mg, 2.5 mg, Oral, TID WC, Simmons, Brittainy M, PA-C, 2.5 mg at 02/09/21 0838   milrinone (PRIMACOR) 20 MG/100 ML (0.2 mg/mL) infusion, 0.25 mcg/kg/min, Intravenous, Continuous, Duayne Cal, NP, Last Rate: 7.87 mL/hr at 02/09/21 0800, 0.25 mcg/kg/min at 02/09/21 0800   norepinephrine (LEVOPHED) 4mg  in premix infusion, 2-10 mcg/min, Intravenous, Titrated, Ogan, Okoronkwo U, MD, Last Rate: 18.75 mL/hr at 02/09/21 0837, 5 mcg/min at 02/09/21 0837   ondansetron (ZOFRAN) injection 4 mg, 4 mg, Intravenous, Q6H PRN, Hunsucker, 04/12/21, MD, 4 mg at 02/08/21 2115   potassium chloride SA (KLOR-CON) CR tablet 40 mEq, 40 mEq, Oral, BID, 2116, PA-C, 40 mEq at 02/09/21 04/12/21   sodium chloride flush (NS) 0.9 % injection 10-40 mL, 10-40 mL, Intracatheter, Q12H, Hunsucker, 02-01-1997, MD, 40 mL at 02/09/21 0900   sodium chloride flush (NS) 0.9 % injection 10-40 mL, 10-40 mL, Intracatheter, PRN, Hunsucker, 02-01-1997, MD   spironolactone (ALDACTONE) tablet 12.5 mg, 12.5 mg, Oral, Daily, Bensimhon, Lesia Sago, MD, 12.5 mg at 02/09/21 0855   Significant Hospital Events: Including procedures, antibiotic start and stop dates in addition to other pertinent events   6/27 admit to [place] for hemotpysis. Dx PE. Worsening hemoptysis  6/28 tx to [facility] > then to ICU due to hypotension, hemoptysis.  6/29 milrinone, NE, UOP picking up, PICC placed 6/30 hemoptysis improving but present, Cr better, feels  better, on milrinone 7/1 going to add some txa nebs  Interim History / Subjective:  A little hemoptysis this morning with coughing jags   Objective   Blood pressure 96/77, pulse (!) 116, temperature 98.7 F (37.1 C), temperature source Oral, resp. rate (!) 29, height 5' 4.5" (1.638 m), weight 104.9 kg, last menstrual period 10/24/2020, SpO2 93 %. CVP:  [9 mmHg-15 mmHg] 12 mmHg      Intake/Output Summary (Last 24 hours) at 02/09/2021 1003 Last data filed at 02/09/2021 0900 Gross per 24 hour  Intake 2137.95 ml  Output 1060 ml  Net 1077.95 ml   Filed Weights   02/06/21 2015 02/07/21 0630 02/08/21 0500  Weight: 104.9 kg 103.6 kg 104.9 kg    Examination: General: wdwn adult F reclined in bed NAD  HENT: NCAT pink mm. Scant dried blood on lips  Lungs: Even unlabored on RA. No adventitious sounds. Symmetrical chest expansion. Occasional cough  Cardiovascular: tachy. S1s2 cap refill brisk  Abdomen: soft round ndnt + bowel sounds  Extremities:  BLE edema. No acute joint deformity no cyanosis or clubbing  Neuro: AAOx4 following commands no focal deficit   LABS    PULMONARY Recent Labs  Lab 02/07/21 0340 02/07/21 1559 02/08/21 1058 02/09/21 0314  O2SAT 75.0 63.1 61.5  45.8    CBC Recent Labs  Lab 02/06/21 2008 02/06/21 2018 02/07/21 0226 02/08/21 0334  HGB 12.6  --  13.5 9.8*  HCT 38.3  --  41.1 30.6*  WBC 17.5*  --  17.0* 19.6*  PLT 156 172 150 145*    COAGULATION Recent Labs  Lab 02/06/21 2008 02/06/21 2018 02/07/21 1808 02/08/21 1017  INR 1.9* 1.9* 1.8* 1.6*    CARDIAC  No results for input(s): TROPONINI in the last 168 hours. No results for input(s): PROBNP in the last 168 hours.   CHEMISTRY Recent Labs  Lab 02/06/21 2008 02/07/21 0226 02/07/21 1808 02/08/21 0334 02/08/21 1732 02/09/21 0314  NA 131* 136 132* 135 132* 130*  K 4.0 3.4* 3.1* 3.1* 4.8 4.2  CL 96* 97* 94* 101 98 96*  CO2 18* 24 24 22 23 22   GLUCOSE 135* 113* 319* 161* 205* 256*   BUN 21* 21* 18 17 22* 21*  CREATININE 1.13* 1.13* 1.05* 0.99 1.31* 1.27*  CALCIUM 7.9* 8.1* 6.9* 6.7* 7.5* 7.5*  MG 1.6*  --  1.5* 1.7  --  1.8   Estimated Creatinine Clearance: 75.1 mL/min (A) (by C-G formula based on SCr of 1.27 mg/dL (H)).   LIVER Recent Labs  Lab 02/06/21 2008 02/06/21 2018 02/07/21 1808 02/08/21 1017 02/09/21 0314  AST 39  --   --   --  28  ALT 55*  --   --   --  35  ALKPHOS 61  --   --   --  48  BILITOT 3.5*  --   --   --  2.4*  PROT 5.2*  --   --   --  4.6*  ALBUMIN 2.4*  --   --   --  1.8*  INR 1.9* 1.9* 1.8* 1.6*  --      INFECTIOUS Recent Labs  Lab 02/06/21 1919 02/06/21 2017 02/07/21 0226 02/07/21 0542 02/07/21 2155 02/08/21 0334 02/08/21 6979  LATICACIDVEN  --    < >  --  2.3* 2.2*  --  1.9  PROCALCITON 2.31  --  2.54  --   --  1.68  --    < > = values in this interval not displayed.     ENDOCRINE CBG (last 3)  Recent Labs    02/06/21 2022 02/08/21 2148  GLUCAP 134* 155*         IMAGING x48h  - image(s) personally visualized  -   highlighted in bold VAS [place] LOWER EXTREMITY VENOUS (DVT)  Result Date: 02/07/2021  Lower Venous DVT Study Patient Name:  Debra Daniel  Date of Exam:   02/07/2021 Medical Rec #: [id number]      Accession #:    [id number] Date of Birth: [birth year]      Patient Gender: F Patient Age:   32Y Exam Location:  [facility] Procedure:      VAS [place] LOWER EXTREMITY VENOUS (DVT) Referring Phys: [id number] MURALI RAMASWAMY --------------------------------------------------------------------------------  Indications: Edema.  Limitations: Body habitus. Comparison Study: no prior Performing Technologist: [place] Ponder RVS  Examination Guidelines: A complete evaluation includes B-mode imaging, spectral Doppler, color Doppler, and power Doppler as needed of all accessible portions of each vessel. Bilateral testing is considered an integral part of a complete examination. Limited examinations for reoccurring indications  may be performed as noted. The reflux portion of the exam is performed with the patient in reverse Trendelenburg.  +---------+---------------+---------+-----------+----------+-------------------+ RIGHT    CompressibilityPhasicitySpontaneityPropertiesThrombus Aging      +---------+---------------+---------+-----------+----------+-------------------+ CFV  Full           Yes      Yes                                      +---------+---------------+---------+-----------+----------+-------------------+ SFJ      Full                                                             +---------+---------------+---------+-----------+----------+-------------------+ FV Prox  Full                                                             +---------+---------------+---------+-----------+----------+-------------------+ FV Mid   Full                                                             +---------+---------------+---------+-----------+----------+-------------------+ FV Distal               Yes      Yes                                      +---------+---------------+---------+-----------+----------+-------------------+ PFV      Full                                                             +---------+---------------+---------+-----------+----------+-------------------+ POP      Full           Yes      Yes                                      +---------+---------------+---------+-----------+----------+-------------------+ PTV      Full                                                             +---------+---------------+---------+-----------+----------+-------------------+ PERO                                                  Not well visualized +---------+---------------+---------+-----------+----------+-------------------+   +---------+---------------+---------+-----------+----------+-------------------+ LEFT      CompressibilityPhasicitySpontaneityPropertiesThrombus Aging      +---------+---------------+---------+-----------+----------+-------------------+ CFV      Full  Yes      Yes                                      +---------+---------------+---------+-----------+----------+-------------------+ SFJ      Full                                                             +---------+---------------+---------+-----------+----------+-------------------+ FV Prox  Full                                                             +---------+---------------+---------+-----------+----------+-------------------+ FV Mid                  Yes      Yes                                      +---------+---------------+---------+-----------+----------+-------------------+ FV Distal               Yes      Yes                                      +---------+---------------+---------+-----------+----------+-------------------+ PFV      Full                                                             +---------+---------------+---------+-----------+----------+-------------------+ POP      Full           Yes      Yes                                      +---------+---------------+---------+-----------+----------+-------------------+ PTV      Full                                                             +---------+---------------+---------+-----------+----------+-------------------+ PERO                                                  Not well visualized +---------+---------------+---------+-----------+----------+-------------------+     Summary: BILATERAL: - No evidence of deep vein thrombosis seen in the lower extremities, bilaterally. -No evidence of popliteal cyst, bilaterally.   *See table(s) above for measurements and observations. Electronically signed by Gretta Beganodd Early MD on 02/07/2021 at 2:57:05 PM.  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 Severely reduced RV systolic function. Severe secondary MR. BNP 1500. (POA) Core is warm, distal extremities cool. Suspect subacute low flow state due to CHF yielded GI symptoms, UTI may be true or red herring (UA dirty, no culture). N/V preceded cough. Cough started  Cough ever since, junky sputum, recent development hemoptysis. Led to presentation and discovery of BiV failure, subacute nature fits a bit with relative compensation. P -NE, milrinone, midodrine per HF  - Increasing milrinone 7/1 with worsening coox -cont diuresis per Adv HF -amio, dig, spiro per HF  -if decompensates, wonder if may require ecmo + transfer to [facility] (or similar) for possible transplant eval   Acute hypoxic respiratory failure  CAP  GGOs  Hemoptysis  -suspect r/t infiltrates + coagulopathy, elevated PV pressures contributing, PE (heparin gtt at [facility] made this much worse) -improving with Vit K/ INR correction  Reported Pulmonary Embolism per OSH report  -unable to anticoagulate with worsening hemoptysis  P -Cont enteral Vit K. Repeat INR pending, may need IV  -will order 2d TXA nebs  -5d course of rocephin  -autoimmune, vasculitis w/u unremarkable so far  -Quant Gold pending -- on airborne in interim. Low suspicion for this  - Consider empiric steroids if respiratory status worsens, favor holding off in case she can tolerate diagnostic procedure in the future -O2 for SpO2 goal >88%  AKI  Improved with diuresis, inotrope. Hyponatremia, mild  - trend renal indices, UOP - diuresis per HF  -FW restriction   Elevated LFTs -- congestive from biV failure  Elevated INRsuspect related to congestive hepatopathy but nutritional deficiency considered. P -Trend coags, correct as able -supportive care -BiV tx as above    Hypothyroidism TSH 13 and t4 mildly elevated as well on admission. P - holding home synthroid for now  Microscopic hematuria Presumed due to coagulopathy. Vasculitis possible. P -recheck UA when coagulopathy is improved   Hyperglycemia -SSI Best Practice (right click and "Reselect all SmartList Selections" daily)   Diet/type: Regular consistency (see orders) DVT prophylaxis: SCD GI prophylaxis: PPI Lines: N/A, Central line, and yes and it is still needed Foley:  Yes, and it is still needed Code Status:  full code Last date of multidisciplinary goals of care discussion [ 6/28]  CRITICAL CARE Performed by: Lanier Clam   Total critical care time: 36 minutes  Critical care time was exclusive of separately billable procedures and treating other patients.  Critical care was necessary to treat or prevent imminent or life-threatening deterioration.  Critical care was time spent personally by me on the following activities: development of treatment plan with patient and/or surrogate as well as nursing, discussions with consultants, evaluation of patient's response to treatment, examination of patient, obtaining history from patient or surrogate, ordering and performing treatments and interventions, ordering and review of laboratory studies, ordering and review of radiographic studies, pulse oximetry and re-evaluation of patient's condition.  Tessie Fass MSN, AGACNP-BC [facility] Pulmonary/Critical Care Medicine Amion for pager  02/09/2021, 10:04 AM

## 2021-02-09 NOTE — Progress Notes (Signed)
eLink Physician-Brief Progress Note Patient Name: Debra Daniel DOB: 08/08/1986 MRN: [id number]   Date of Service  02/09/2021  HPI/Events of Note  Lactic Acid = 3.4. Likely d/t severe cardiogenic shock with Last COOX on Norepinephrine, Midodrine and Milrinone = 48.  eICU Interventions  Defer further management of severe cardiogenic shock to Heart Failure Team.      Intervention Category Major Interventions: Shock - evaluation and management  [name] Dennard Nip 02/09/2021, 7:49 PM

## 2021-02-09 NOTE — Progress Notes (Signed)
eLink Physician-Brief Progress Note Patient Name: Debra Daniel DOB: [birth year] MRN: [id number]   Date of Service  02/09/2021  HPI/Events of Note  Patient needs order for CBG with SSI coverage AC and HS.  eICU Interventions  Order entered.        Thomasene Lot Ivone Licht 02/09/2021, 6:23 AM

## 2021-02-09 NOTE — Evaluation (Signed)
Physical Therapy Evaluation Patient Details Name: Debra Daniel MRN: [id number] DOB: Feb 01, 1986 Today's Date: 02/09/2021   History of Present Illness  pt is a 35 y/o female presenting 6/30 from [facility] with report of mild hemoptysis, worsening LE edema and R sided segmental PE on CT.  Heparin caused significant worsening of her hemoptysis, so now not anticoagulated.  Edho demonstrated LVEF of 25-30% with mod-severe mitral regurgitation.  PMH  hypothyroid, UTI's  Clinical Impression  Pt admitted with/for hemoptysis, s/sof CHF with LE edema and PE on CT scan.  Pt currently moving around at a min guard to supervision level with vss and HR staying generally in the 120's.  Pt currently limited functionally due to the problems listed below.  (see problems list.)  Pt will benefit from PT to maximize function and safety to be able to get home safely with available assist.     Follow Up Recommendations No PT follow up;Other (comment) (unless fails to progress as expected then HHPT)    Equipment Recommendations  None recommended by PT    Recommendations for Other Services       Precautions / Restrictions Precautions Precautions: Fall      Mobility  Bed Mobility Overal bed mobility: Needs Assistance Bed Mobility: Sit to Supine       Sit to supine: Min guard   General bed mobility comments: no assist, flat bed, extra time.    Transfers Overall transfer level: Needs assistance Equipment used: Rolling walker (2 wheeled)   Sit to Stand: Supervision         General transfer comment: cues for hand placement  Ambulation/Gait Ambulation/Gait assistance: Min guard Gait Distance (Feet): 140 Feet Assistive device: Rolling walker (2 wheeled) Gait Pattern/deviations: Step-through pattern     General Gait Details: steady, but slower cadence, 2 standing rests.  HR rise from lower 120's to max 133 bpm, SpO2 upper 90's  Stairs            Wheelchair Mobility    Modified  Rankin (Stroke Patients Only)       Balance Overall balance assessment: Needs assistance Sitting-balance support: Feet supported;No upper extremity supported Sitting balance-Leahy Scale: Good       Standing balance-Leahy Scale: Fair                               Pertinent Vitals/Pain Pain Assessment: Faces Faces Pain Scale: No hurt Pain Intervention(s): Monitored during session    Home Living Family/patient expects to be discharged to:: Private residence Living Arrangements: Parent Available Help at Discharge: Family;Available 24 hours/day Type of Home: Mobile home Home Access: Stairs to enter;Ramped entrance     Home Layout: One level Home Equipment: Walker - 2 wheels      Prior Function Level of Independence: Independent with assistive device(s)               Hand Dominance        Extremity/Trunk Assessment   Upper Extremity Assessment Upper Extremity Assessment: Generalized weakness;Overall [facility] for tasks assessed    Lower Extremity Assessment Lower Extremity Assessment: Generalized weakness    Cervical / Trunk Assessment Cervical / Trunk Assessment: Normal  Communication   Communication: No difficulties  Cognition Arousal/Alertness: Awake/alert Behavior During Therapy: WFL for tasks assessed/performed Overall Cognitive Status: Within Functional Limits for tasks assessed  General Comments      Exercises     Assessment/Plan    PT Assessment Patient needs continued PT services  PT Problem List Decreased strength;Decreased activity tolerance;Decreased balance;Decreased mobility;Decreased safety awareness;Decreased knowledge of precautions       PT Treatment Interventions Gait training;Functional mobility training;Therapeutic activities;Patient/family education;Stair training;Balance training    PT Goals (Current goals can be found in the Care Plan section)  Acute Rehab PT  Goals Patient Stated Goal: home independent when cleared. PT Goal Formulation: With patient Time For Goal Achievement: 02/23/21 Potential to Achieve Goals: Good    Frequency Min 3X/week   Barriers to discharge        Co-evaluation               AM-PAC PT "6 Clicks" Mobility  Outcome Measure Help needed turning from your back to your side while in a flat bed without using bedrails?: A Little Help needed moving from lying on your back to sitting on the side of a flat bed without using bedrails?: A Little Help needed moving to and from a bed to a chair (including a wheelchair)?: A Little Help needed standing up from a chair using your arms (e.g., wheelchair or bedside chair)?: A Little Help needed to walk in hospital room?: A Little Help needed climbing 3-5 steps with a railing? : A Little 6 Click Score: 18    End of Session   Activity Tolerance: Patient tolerated treatment well;Patient limited by fatigue Patient left: in bed;with call bell/phone within reach Nurse Communication: Mobility status PT Visit Diagnosis: Muscle weakness (generalized) (M62.81);Difficulty in walking, not elsewhere classified (R26.2);Other abnormalities of gait and mobility (R26.89)    Time: 1335-1406 PT Time Calculation (min) (ACUTE ONLY): 31 min   Charges:   PT Evaluation $PT Eval Moderate Complexity: 1 Mod PT Treatments $Gait Training: 8-22 mins        02/09/2021  Jacinto Halim., PT Acute Rehabilitation Services [phone]  (pager) [phone]  (office)  Debra Daniel 02/09/2021, 3:38 PM

## 2021-02-09 NOTE — Progress Notes (Signed)
Critical care attending attestation note:  Patient seen and examined and relevant ancillary tests reviewed.  I agree with the assessment and plan of care as outlined by Tessie Fass, NP.   Synopsis of assessment and plan:  35 year old woman who is critically ill due to cardiogenic shock requiring titration of milrinone and NE.  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   She states that her breathing is improving.  On examination there is no respiratory distress, JVP is elevated. +3 LE edema.   Echo personally reviewed and shows severe biventricular failure L>>R with severe MR/TR.  Despite vasoactive infusions, her ScvO2 remains in the 40's - she has severe valvular regurgitation, so limited improvement in forward flow with inotropic support.   Assessment:   While clinically she has improved somewhat, overall she continues to require a lot of support. Cardiac function unlikely to improve dramatically in the short term.  Continue current inotropic support and continue to diurese, but may need mechanical cardiac support.     CRITICAL CARE Performed by: Lynnell Catalan   Total critical care time: 40 minutes  Critical care time was exclusive of separately billable procedures and treating other patients.  Critical care was necessary to treat or prevent imminent or life-threatening deterioration.  Critical care was time spent personally by me on the following activities: development of treatment plan with patient and/or surrogate as well as nursing, discussions with consultants, evaluation of patient's response to treatment, examination of patient, obtaining history from patient or surrogate, ordering and performing treatments and interventions, ordering and review of laboratory studies, ordering and review of radiographic studies, pulse oximetry, re-evaluation of patient's condition and participation in multidisciplinary rounds.  Lynnell Catalan, MD [facility] ICU  Physician [facility] [place] Critical Care  Pager: [phone] Mobile: [phone] After hours: [phone].  02/09/2021, 3:04 PM

## 2021-02-10 DIAGNOSIS — R042 Hemoptysis: Secondary | ICD-10-CM | POA: Diagnosis not present

## 2021-02-10 LAB — CBC
HCT: 28 % — ABNORMAL LOW (ref 36.0–46.0)
HCT: 30.5 % — ABNORMAL LOW (ref 36.0–46.0)
Hemoglobin: 9.2 g/dL — ABNORMAL LOW (ref 12.0–15.0)
Hemoglobin: 9.9 g/dL — ABNORMAL LOW (ref 12.0–15.0)
MCH: 26 pg (ref 26.0–34.0)
MCH: 26.3 pg (ref 26.0–34.0)
MCHC: 32.9 g/dL (ref 30.0–36.0)
MCHC: 32.5 g/dL (ref 30.0–36.0)
MCV: 79.1 fL — ABNORMAL LOW (ref 80.0–100.0)
MCV: 81.1 fL (ref 80.0–100.0)
Platelets: 164 10*3/uL (ref 150–400)
Platelets: 178 10*3/uL (ref 150–400)
RBC: 3.54 MIL/uL — ABNORMAL LOW (ref 3.87–5.11)
RBC: 3.76 MIL/uL — ABNORMAL LOW (ref 3.87–5.11)
RDW: 16.7 % — ABNORMAL HIGH (ref 11.5–15.5)
RDW: 16.8 % — ABNORMAL HIGH (ref 11.5–15.5)
WBC: 19.1 10*3/uL — ABNORMAL HIGH (ref 4.0–10.5)
WBC: 17.4 10*3/uL — ABNORMAL HIGH (ref 4.0–10.5)
nRBC: 0.2 % (ref 0.0–0.2)
nRBC: 0.1 % (ref 0.0–0.2)

## 2021-02-10 LAB — GLUCOSE, CAPILLARY
Glucose-Capillary: 123 mg/dL — ABNORMAL HIGH (ref 70–99)
Glucose-Capillary: 112 mg/dL — ABNORMAL HIGH (ref 70–99)
Glucose-Capillary: 113 mg/dL — ABNORMAL HIGH (ref 70–99)
Glucose-Capillary: 110 mg/dL — ABNORMAL HIGH (ref 70–99)
Glucose-Capillary: 92 mg/dL (ref 70–99)

## 2021-02-10 LAB — BASIC METABOLIC PANEL
Anion gap: 9 (ref 5–15)
BUN: 20 mg/dL (ref 6–20)
CO2: 27 mmol/L (ref 22–32)
Calcium: 7.2 mg/dL — ABNORMAL LOW (ref 8.9–10.3)
Chloride: 96 mmol/L — ABNORMAL LOW (ref 98–111)
Creatinine, Ser: 1.1 mg/dL — ABNORMAL HIGH (ref 0.44–1.00)
GFR, Estimated: >60 mL/min (ref >60)
Glucose, Bld: 190 mg/dL — ABNORMAL HIGH (ref 70–99)
Potassium: 3.1 mmol/L — ABNORMAL LOW (ref 3.5–5.1)
Sodium: 132 mmol/L — ABNORMAL LOW (ref 135–145)

## 2021-02-10 LAB — QUANTIFERON-TB GOLD PLUS (RQFGPL)
QuantiFERON Mitogen Value: 1.18 IU/mL
QuantiFERON Nil Value: 0 IU/mL
QuantiFERON TB1 Ag Value: 0 IU/mL
QuantiFERON TB2 Ag Value: 0 IU/mL

## 2021-02-10 LAB — COOXEMETRY PANEL
Carboxyhemoglobin: 1.7 % — ABNORMAL HIGH (ref 0.5–1.5)
Methemoglobin: 0.7 % (ref 0.0–1.5)
O2 Saturation: 84.1 %
Total hemoglobin: 9.6 g/dL — ABNORMAL LOW (ref 12.0–16.0)

## 2021-02-10 LAB — HEMOGLOBIN A1C
Hgb A1c MFr Bld: 6.7 % — ABNORMAL HIGH (ref 4.8–5.6)
Mean Plasma Glucose: 146 mg/dL

## 2021-02-10 LAB — QUANTIFERON-TB GOLD PLUS: QuantiFERON-TB Gold Plus: NEGATIVE

## 2021-02-10 LAB — HEPARIN LEVEL (UNFRACTIONATED)
Heparin Unfractionated: >1.1 IU/mL — ABNORMAL HIGH (ref 0.30–0.70)
Heparin Unfractionated: <0.1 IU/mL — ABNORMAL LOW (ref 0.30–0.70)

## 2021-02-10 LAB — LACTIC ACID, PLASMA: Lactic Acid, Venous: 1.6 mmol/L (ref 0.5–1.9)

## 2021-02-10 MED ORDER — POTASSIUM CHLORIDE CRYS ER 20 MEQ PO TBCR: 40.0000 meq | EXTENDED_RELEASE_TABLET | Freq: Two times a day (BID) | ORAL | Status: DC

## 2021-02-10 MED ORDER — POTASSIUM CHLORIDE CRYS ER 20 MEQ PO TBCR
40.0000 meq | EXTENDED_RELEASE_TABLET | ORAL | Status: AC
  Administered 2021-02-10 (×4): 40 meq via ORAL
  Filled 2021-02-10 (×4): qty 2

## 2021-02-10 MED ORDER — HEPARIN (PORCINE) 25000 UT/250ML-% IV SOLN
2100.0000 [IU]/h | INTRAVENOUS | Status: DC
  Administered 2021-02-10: 900 [IU]/h via INTRAVENOUS
  Administered 2021-02-11 (×2): 1350 [IU]/h via INTRAVENOUS
  Administered 2021-02-12: 1950 [IU]/h via INTRAVENOUS
  Administered 2021-02-12: 1650 [IU]/h via INTRAVENOUS
  Administered 2021-02-13: 2100 [IU]/h via INTRAVENOUS
  Filled 2021-02-10 (×5): qty 250

## 2021-02-10 MED ORDER — SODIUM CHLORIDE 0.9 % IV SOLN
2.0000 g | INTRAVENOUS | Status: AC
  Administered 2021-02-10 – 2021-02-11 (×2): 2 g via INTRAVENOUS
  Filled 2021-02-10: qty 2
  Filled 2021-02-10: qty 20

## 2021-02-10 NOTE — Progress Notes (Signed)
ANTICOAGULATION CONSULT NOTE  Pharmacy Consult for IV heparin Indication: pulmonary embolus  Allergies  Allergen Reactions   Sulfa Antibiotics Hives   Ibuprofen Other (See Comments)    Constipation Can tolerate w milk of mag    Patient Measurements: Height: 5' 4.5" (163.8 cm) Weight: 103.2 kg (227 lb 8.2 oz) IBW/kg (Calculated) : 55.85 Heparin Dosing Weight: ~ 80 kg  Vital Signs: Temp: 98.3 F (36.8 C) (07/02 1937) Temp Source: Oral (07/02 1937) BP: 106/77 (07/02 1900) Pulse Rate: 109 (07/02 1900)  Labs: Recent Labs    02/08/21 1017 02/08/21 1732 02/09/21 0314 02/09/21 1004 02/09/21 1230 02/10/21 0440 02/10/21 1542 02/10/21 1744  HGB  --   --   --   --  9.2* 9.2* 9.9*  --   HCT  --   --   --   --  28.9* 28.0* 30.5*  --   PLT  --   --   --   --  142* 164 178  --   LABPROT 19.0*  --   --  18.6*  --   --   --   --   INR 1.6*  --   --  1.6*  --   --   --   --   HEPARINUNFRC  --   --   --   --   --   --  >1.10* <0.10*  CREATININE  --  1.31* 1.27*  --   --  1.10*  --   --      Estimated Creatinine Clearance: 85.9 mL/min (A) (by C-G formula based on SCr of 1.1 mg/dL (H)).   Medical History: Past Medical History:  Diagnosis Date   Encounter for menstrual regulation 01/24/2015   Hypothyroid    Migraines    Obesity    UTI (urinary tract infection)     Medications:  Infusions:   sodium chloride Stopped (02/08/21 1759)   sodium chloride Stopped (02/08/21 0434)   sodium chloride Stopped (02/09/21 1707)   amiodarone 30 mg/hr (02/10/21 1955)   cefTRIAXone (ROCEPHIN)  IV Stopped (02/10/21 1225)   furosemide (LASIX) 200 mg in dextrose 5% 100 mL (2mg /mL) infusion 8 mg/hr (02/10/21 1900)   heparin 900 Units/hr (02/10/21 1900)   milrinone 0.375 mcg/kg/min (02/10/21 1900)   norepinephrine (LEVOPHED) Adult infusion 4 mcg/min (02/09/21 2229)    Assessment: 35 yo female with new PE.  Heparin started at OSH, then discontinued due to hemoptysis.  Now hemoptysis  improving, and pharmacy asked to reinitiate heparin with no bolus and low goal levels.  Discussed with Drs. Agarwala and 32.  Initial heparin level came back >1.1 (was drawn from central line with pause) - got repeat peripheral draw which came back <0.1, on 900 units/hr. Was smear of blood per RN earlier in shift without any recurrence. Hemoptysis stable. No infusion issues.   Goal of Therapy:  Heparin level 0.2-0.4 Monitor platelets by anticoagulation protocol: Yes   Plan:  Increase IV heparin gtt to 1100 units/hr Check heparin level in 6 hrs. Daily heparin level and CBC. Monitor closely for worsening of hemoptysis. F/u plans for oral anticoagulation eventually.  Shirlee Latch, PharmD, BCCCP Clinical Pharmacist  Phone: [phone] 02/10/2021 8:23 PM  Please check AMION for all [facility] Pharmacy phone numbers After 10:00 PM, call Main Pharmacy [phone]

## 2021-02-10 NOTE — TOC Progression Note (Signed)
Transition of Care [facility]) - Progression Note    Patient Details  Name: Debra Daniel MRN: [id number] Date of Birth: 03-Jun-1986  [facility]) CM/SW Contact  Levada Schilling Phone Number: 02/10/2021, 2:46 PM  Clinical Narrative:     CSW received a consult from RN.CSW spoke with pt and pt's family at bedside concerning starting application for medicaid.  Pt's mother ask CSW to start the process for MAD 90 application for pt.  CSW explain that a Artist will contact pt to start process for medicaid.  CSW will send financial counselor department an email requesting to speak with pt and family concerning medicaid.  Pt will have cellphone on Sunday and can be reached at [phone].  Pt's mother can be reached at  home # [phone].       Expected Discharge Plan and Services                                                 Social Determinants of Health (SDOH) Interventions    Readmission Risk Interventions No flowsheet data found.

## 2021-02-10 NOTE — Progress Notes (Signed)
ANTICOAGULATION CONSULT NOTE - Initial Consult  Pharmacy Consult for IV heparin Indication: pulmonary embolus  Allergies  Allergen Reactions   Sulfa Antibiotics Hives   Ibuprofen Other (See Comments)    Constipation Can tolerate w milk of mag    Patient Measurements: Height: 5' 4.5" (163.8 cm) Weight: 103.2 kg (227 lb 8.2 oz) IBW/kg (Calculated) : 55.85 Heparin Dosing Weight: ~ 80 kg  Vital Signs: Temp: 97.7 F (36.5 C) (07/02 0700) Temp Source: Oral (07/02 0419) BP: 112/81 (07/02 0645) Pulse Rate: 116 (07/02 0645)  Labs: Recent Labs    02/07/21 1808 02/07/21 1808 02/08/21 0334 02/08/21 1017 02/08/21 1732 02/09/21 0314 02/09/21 1004 02/09/21 1230 02/10/21 0440  HGB  --    < > 9.8*  --   --   --   --  9.2* 9.2*  HCT  --   --  30.6*  --   --   --   --  28.9* 28.0*  PLT  --   --  145*  --   --   --   --  142* 164  LABPROT 21.2*  --   --  19.0*  --   --  18.6*  --   --   INR 1.8*  --   --  1.6*  --   --  1.6*  --   --   CREATININE 1.05*  --  0.99  --  1.31* 1.27*  --   --  1.10*   < > = values in this interval not displayed.    Estimated Creatinine Clearance: 85.9 mL/min (A) (by C-G formula based on SCr of 1.1 mg/dL (H)).   Medical History: Past Medical History:  Diagnosis Date   Encounter for menstrual regulation 01/24/2015   Hypothyroid    Migraines    Obesity    UTI (urinary tract infection)     Medications:  Infusions:   sodium chloride Stopped (02/08/21 1759)   sodium chloride Stopped (02/08/21 0434)   sodium chloride Stopped (02/09/21 1707)   amiodarone 30 mg/hr (02/10/21 0647)   cefTRIAXone (ROCEPHIN)  IV     furosemide (LASIX) 200 mg in dextrose 5% 100 mL (2mg /mL) infusion 8 mg/hr (02/10/21 0802)   heparin     milrinone 0.375 mcg/kg/min (02/10/21 0658)   norepinephrine (LEVOPHED) Adult infusion 4 mcg/min (02/09/21 2229)    Assessment: 35 yo female with new PE.  Heparin started at OSH, then discontinued due to hemoptysis.  Now hemoptysis  improving, and pharmacy asked to reinitiate heparin with no bolus and low goal levels.  Discussed with Drs. Agarwala and 32.  Hgb low but stable, 9.2.  Platelet count OK  Goal of Therapy:  Heparin level 0.2-0.4 Monitor platelets by anticoagulation protocol: Yes   Plan:  Start IV heparin gtt at 900 units/hr Check heparin level in 6 hrs. Daily heparin level and CBC. Monitor closely for worsening of hemoptysis. F/u plans for oral anticoagulation eventually.  Shirlee Latch, Reece Leader, BCCP Clinical Pharmacist  02/10/2021 8:41 AM   [facility] pharmacy phone numbers are listed on [email or web address]

## 2021-02-10 NOTE — Progress Notes (Signed)
NAME:  Debra Daniel, MRN:  [id number], DOB:  1985/10/13, LOS: 4 ADMISSION DATE:  02/06/2021, CONSULTATION DATE:  6/28 REFERRING MD:  Dr. Adela Glimpse, CHIEF COMPLAINT:  hemoptysis   History of Present Illness:  35 year old female with no significant past medical history up until quite recently. Three months prior to admission she was diagnosed with hypothyroid and was started on synthroid. Then, approximately 3 months prior to presentation she developed UTI symptoms. She is unclear what medications she was treated with, although she remembers the first one was ineffective and a second antibiotic was needed. Progressive lower extremity edema since that time as well. Then 6/25 she began to experience mild hemoptysis, which prompted her to present to [facility] 6/27. Workup in the ED included CTA chest and CT abdomen/pelvis. She was found to have right sided segmental PE as well a crazy paving pattern on the CT chest. CT abdomen was non-acute. She was admitted. Started on heparin for PE, which caused her hemoptysis to become much worse. Heparin was stopped. Echocardiogram done demonstrated LVEF 25-30% as well as mod-severe mitral regurgitation. She was transferred to [facility] for further evaluation.   Pertinent  Medical History   has a past medical history of Encounter for menstrual regulation (01/24/2015), Hypothyroid, Migraines, Obesity, and UTI (urinary tract infection).    has a past medical history of Encounter for menstrual regulation (01/24/2015), Hypothyroid, Migraines, Obesity, and UTI (urinary tract infection).   reports that she has never smoked. She has never used smokeless tobacco.   Significant Hospital Events: Including procedures, antibiotic start and stop dates in addition to other pertinent events   6/27 admit to [place] for hemotpysis. Dx PE. Worsening hemoptysis  6/28 tx to [facility] > then to ICU due to hypotension, hemoptysis.  6/29 milrinone, NE, UOP picking up,  PICC placed 6/30 hemoptysis improving but present, Cr better, feels better, on milrinone 7/1 going to add some txa nebs 7/2 no further hemoptysis and no SCVO2 better.   Interim History / Subjective:  Minimal cough and no frank hemoptysis  Objective   Blood pressure 104/79, pulse (!) 121, temperature 97.8 F (36.6 C), resp. rate 20, height 5' 4.5" (1.638 m), weight 103.2 kg, last menstrual period 10/24/2020, SpO2 95 %. CVP:  [1 mmHg-20 mmHg] 5 mmHg  FiO2 (%):  [28 %] 28 %   Intake/Output Summary (Last 24 hours) at 02/10/2021 1344 Last data filed at 02/10/2021 1200 Gross per 24 hour  Intake 1243.2 ml  Output 3030 ml  Net -1786.8 ml    Filed Weights   02/07/21 0630 02/08/21 0500 02/10/21 0431  Weight: 103.6 kg 104.9 kg 103.2 kg    Examination: General: wdwn adult F sitting in chair NAD  HENT: NCAT pink mm. Scant dried blood on lips  Lungs: Even unlabored on RA. No adventitious sounds. Symmetrical chest expansion. Occasional cough  Cardiovascular: tachy. S1s2 cap refill brisk, Gallop LV heave.  Abdomen: soft round ndnt + bowel sounds  Extremities:  BLE edema. No acute joint deformity no cyanosis or clubbing  Neuro: AAOx4 following commands no focal deficit   LABS    PULMONARY Recent Labs  Lab 02/09/21 0314 02/09/21 1004 02/09/21 1425 02/09/21 1600 02/10/21 0440  O2SAT 45.8 42.2 49.4 48.1 84.1     CBC Recent Labs  Lab 02/08/21 0334 02/09/21 1230 02/10/21 0440  HGB 9.8* 9.2* 9.2*  HCT 30.6* 28.9* 28.0*  WBC 19.6* 19.6* 19.1*  PLT 145* 142* 164     COAGULATION Recent Labs  Lab 02/06/21 2008 02/06/21 2018 02/07/21 1808 02/08/21 1017 02/09/21 1004  INR 1.9* 1.9* 1.8* 1.6* 1.6*     CARDIAC  No results for input(s): TROPONINI in the last 168 hours. No results for input(s): PROBNP in the last 168 hours.   CHEMISTRY Recent Labs  Lab 02/06/21 2008 02/07/21 0226 02/07/21 1808 02/08/21 0334 02/08/21 1732 02/09/21 0314 02/10/21 0440  NA 131*   < >  132* 135 132* 130* 132*  K 4.0   < > 3.1* 3.1* 4.8 4.2 3.1*  CL 96*   < > 94* 101 98 96* 96*  CO2 18*   < > 24 22 23 22 27   GLUCOSE 135*   < > 319* 161* 205* 256* 190*  BUN 21*   < > 18 17 22* 21* 20  CREATININE 1.13*   < > 1.05* 0.99 1.31* 1.27* 1.10*  CALCIUM 7.9*   < > 6.9* 6.7* 7.5* 7.5* 7.2*  MG 1.6*  --  1.5* 1.7  --  1.8  --    < > = values in this interval not displayed.    Estimated Creatinine Clearance: 85.9 mL/min (A) (by C-G formula based on SCr of 1.1 mg/dL (H)).   LIVER Recent Labs  Lab 02/06/21 2008 02/06/21 2018 02/07/21 1808 02/08/21 1017 02/09/21 0314 02/09/21 1004  AST 39  --   --   --  28  --   ALT 55*  --   --   --  35  --   ALKPHOS 61  --   --   --  48  --   BILITOT 3.5*  --   --   --  2.4*  --   PROT 5.2*  --   --   --  4.6*  --   ALBUMIN 2.4*  --   --   --  1.8*  --   INR 1.9* 1.9* 1.8* 1.6*  --  1.6*      INFECTIOUS Recent Labs  Lab 02/06/21 1919 02/06/21 2017 02/07/21 0226 02/07/21 0542 02/08/21 0334 02/08/21 0623 02/09/21 1800 02/10/21 0440  LATICACIDVEN  --    < >  --    < >  --  1.9 3.4* 1.6  PROCALCITON 2.31  --  2.54  --  1.68  --   --   --    < > = values in this interval not displayed.      ENDOCRINE CBG (last 3)  Recent Labs    02/10/21 0417 02/10/21 0753 02/10/21 1121  GLUCAP 123* 112* 113*     IMAGING x48h  - image(s) personally visualized  -   highlighted in bold No results found.   Resolved Hospital Problem list   AGMA  Assessment & Plan:   Acute exacerbation of newly diagnosis biventricular heart failure with cardiogenic shock MR, TR  Acute hypoxic respiratory failure  CAP  GGOs  Hemoptysis  Reported Pulmonary Embolism per OSH report  AKI  Improved with diuresis, inotrope. Hyponatremia, mild Elevated LFTs -- congestive from biV failure  Elevated INRsuspect related to congestive hepatopathy  Hypothyroidism Microscopic hematuria  Plan:  - continue current diuresis.  - Stop NE but continue  current milrinone.  - Amiodarone and digoxin for AF. - Start low-dose heparin - complete antibiotics.   Best Practice (right click and "Reselect all SmartList Selections" daily)   Diet/type: Regular consistency (see orders) DVT prophylaxis: SCD GI prophylaxis: PPI Lines: N/A, Central line, and yes and it is still needed Foley:  Yes,  and it is still needed Code Status:  full code Last date of multidisciplinary goals of care discussion [ 6/28]  CRITICAL CARE Performed by: Lynnell Catalan   Total critical care time:  Critical care time was exclusive of separately billable procedures and treating other patients.  Critical care was necessary to treat or prevent imminent or life-threatening deterioration.  Critical care was time spent personally by me on the following activities: development of treatment plan with patient and/or surrogate as well as nursing, discussions with consultants, evaluation of patient's response to treatment, examination of patient, obtaining history from patient or surrogate, ordering and performing treatments and interventions, ordering and review of laboratory studies, ordering and review of radiographic studies, pulse oximetry and re-evaluation of patient's condition.  Lynnell Catalan, MD [facility] ICU Physician [facility] [place] Critical Care  Pager: [phone] Or Epic Secure Chat After hours: [phone].  02/10/2021, 1:49 PM     02/10/2021, 1:44 PM

## 2021-02-10 NOTE — Progress Notes (Signed)
Patient ID: Debra Daniel, female   DOB: [birth year], 35 y.o.   MRN: [id number]     Advanced Heart Failure Rounding Note  PCP-Cardiologist: None   Subjective:   -05/13: Seen in ED for recurrent nausea and vomiting. Treated for possible UTI. Ongoing weakness, fatigue, intermittent N/V, cough since. -06/27: ED at OSH with hemoptysis. Found to have segmental PE and pneumonia. EF 25-30%. -06/28: Transferred to [facility]. Cardiogenic shock > milrinone 0.25 mcg. Diuresed with IV lasix. -06/29: Tachycardic. MAT noted on tele. Added digoxin and spiro. Amio added d/t NSVT. Last dose IV lasix. Norepi off. - 6/30: midodrine started and lasix drip started.   Remains on norepi 4 mcg + milrinone 0.375 mcg . CO-OX 84%, lactate down to 1.6.  Creatinine 1.1. Still tachycardic.   Diuresing with lasix drip 15 mg per hour.  Excellent UOP overnight, CVP down to 8 and weight down 4 lbs.   Hemoptysis resolved, still brings up some old blood clots.  She remains on ceftriaxone for PNA.   Has RLL PE seen at [facility] on CTA chest, not on heparin due to hemoptysis.   Feeling better, on 2L oxygen.   Echo 02/06/2021: LVEF 10-15%, LV moderately dilated, RV systolic function severely reduced, RV moderately enlarged, RVSP 45.9 mmHg, severe secondary MR, mod-severe TR (personally reviewed).  Objective:   Weight Range: 103.2 kg Body mass index is 38.45 kg/m.   Vital Signs:   Temp:  [97.7 F (36.5 C)-99.5 F (37.5 C)] 97.7 F (36.5 C) (07/02 0700) Pulse Rate:  [106-129] 116 (07/02 0645) Resp:  [17-55] 22 (07/02 0630) BP: (80-124)/(49-98) 112/81 (07/02 0645) SpO2:  [91 %-100 %] 96 % (07/02 0645) Weight:  [103.2 kg] 103.2 kg (07/02 0431) Last BM Date: 02/06/21  Weight change: Filed Weights   02/07/21 0630 02/08/21 0500 02/10/21 0431  Weight: 103.6 kg 104.9 kg 103.2 kg    Intake/Output:   Intake/Output Summary (Last 24 hours) at 02/10/2021 0809 Last data filed at 02/10/2021 0645 Gross per 24 hour  Intake 979.12  ml  Output 3030 ml  Net -2050.88 ml      Physical Exam   CVP 8 General: NAD Neck: JVP 8 cm, no thyromegaly or thyroid nodule.  Lungs: Crackles at bases.  CV: Nondisplaced PMI.  Heart regular S1/S2, no S3/S4, no murmur.  1+ edema ankles.   Abdomen: Soft, nontender, no hepatosplenomegaly, no distention.  Skin: Intact without lesions or rashes.  Neurologic: Alert and oriented x 3.  Psych: Normal affect. Extremities: No clubbing or cyanosis.  HEENT: Normal.    Telemetry  ST 110s (personally reviewed)  Labs    CBC Recent Labs    02/08/21 0334 02/09/21 1230 02/10/21 0440  WBC 19.6* 19.6* 19.1*  NEUTROABS 16.4*  --   --   HGB 9.8* 9.2* 9.2*  HCT 30.6* 28.9* 28.0*  MCV 81.6 82.3 79.1*  PLT 145* 142* 164   Basic Metabolic Panel Recent Labs    02/04/93 0334 02/08/21 1732 02/09/21 0314 02/10/21 0440  NA 135   < > 130* 132*  K 3.1*   < > 4.2 3.1*  CL 101   < > 96* 96*  CO2 22   < > 22 27  GLUCOSE 161*   < > 256* 190*  BUN 17   < > 21* 20  CREATININE 0.99   < > 1.27* 1.10*  CALCIUM 6.7*   < > 7.5* 7.2*  MG 1.7  --  1.8  --    < > = values  in this interval not displayed.   Liver Function Tests Recent Labs    02/09/21 0314  AST 28  ALT 35  ALKPHOS 48  BILITOT 2.4*  PROT 4.6*  ALBUMIN 1.8*   No results for input(s): LIPASE, AMYLASE in the last 72 hours. Cardiac Enzymes No results for input(s): CKTOTAL, CKMB, CKMBINDEX, TROPONINI in the last 72 hours.  BNP: BNP (last 3 results) Recent Labs    02/06/21 2018  BNP 1,571.8*    ProBNP (last 3 results) No results for input(s): PROBNP in the last 8760 hours.   D-Dimer No results for input(s): DDIMER in the last 72 hours.  Hemoglobin A1C Recent Labs    02/09/21 1004  HGBA1C 6.7*   Fasting Lipid Panel No results for input(s): CHOL, HDL, LDLCALC, TRIG, CHOLHDL, LDLDIRECT in the last 72 hours. Thyroid Function Tests No results for input(s): TSH, T4TOTAL, T3FREE, THYROIDAB in the last 72  hours.  Invalid input(s): FREET3   Other results:   Imaging    No results found.   Medications:     Scheduled Medications:  Chlorhexidine Gluconate Cloth  6 each Topical Daily   digoxin  0.125 mg Oral Daily   insulin aspart  0-5 Units Subcutaneous QHS   insulin aspart  0-9 Units Subcutaneous TID WC   midodrine  5 mg Oral TID WC   potassium chloride  40 mEq Oral BID   sodium chloride flush  10-40 mL Intracatheter Q12H   spironolactone  12.5 mg Oral Daily   tranexamic acid  500 mg Nebulization Q8H    Infusions:  sodium chloride Stopped (02/08/21 1759)   sodium chloride Stopped (02/08/21 0434)   sodium chloride Stopped (02/09/21 1707)   amiodarone 30 mg/hr (02/10/21 0647)   cefTRIAXone (ROCEPHIN)  IV Stopped (02/09/21 1103)   furosemide (LASIX) 200 mg in dextrose 5% 100 mL (2mg /mL) infusion 8 mg/hr (02/10/21 0802)   milrinone 0.375 mcg/kg/min (02/10/21 0658)   norepinephrine (LEVOPHED) Adult infusion 4 mcg/min (02/09/21 2229)    PRN Medications: sodium chloride, sodium chloride, lip balm, ondansetron (ZOFRAN) IV, sodium chloride flush    Assessment/Plan   Acute systolic HF -> Cardiogenic shock/acute biventricular heart failure/new cardiomyopathy -New diagnosis of CHF this admission after presenting with hemoptysis at OSH in [place]. Suspected viral illness with > 1 month hx weakness, fatigue, cough, N/V. HIV negative. Recently diagnosed with hypothyroidism.  Etiology => viral myocarditis versus familial cardiomyopathy (mother and aunts/uncles with CHF), CAD less likely but not ruled out.  - Echo here with EF of 10-15%, mildly dilated LV, moderate to severely reduced RV fxn with mildly dilated RV, RVSP 45 mmHg, severe secondary MR, mod-severe TR. - BNP 1,571. Pregnancy test negative.  - Lactic acid 4.3 > 2.3 > 1.9 > 1.6 - Currently on 0.375 mcg milrinone + norepi 4 mcg.  Co-ox 84%, can start weaning down NE today.  - Excellent UOP with weight down 4 lbs and CVP 8,  decrease Lasix gtt to 8 mg/hr today.  - Continue low dose midodrine 5 mg tid for now, may be able to stop soon.   - Continue dig and spiro.  - No beta blocker or entresto due to shock. - Will need cardiac MRI once more stable. - LHC/RHC will be needed, discussed with patient.   2. Mitral valve regurgitation -Severe on echo this admission.  -Likely secondary, valve does not look abnormal on echo.   3. Moderate-severe TR -Likely secondary  4. NSVT - Continues to have NSVT.  - Continue amio  drip 30 mg per hour.  - Keep K> 4.0 Mg > 2.0 - Replace K today.   5. Hemoptysis/community acquired pneumonia - Possible PE noted on imaging at OSH. Suspect course of events likely viral illness/myocarditis > inactivity, followed by PE and pneumonia. Venous duplex negative for DVT. - CT chest here with consolidation both lower lobes. Ground glass opacities right middle lobe. - On ceftriaxone for CAP.  - WBC 17.0. > 19.6> 19 - Hgb 9.2.  - Now only dried blood coming up (no bright red blood).  Pulmonary edema my have played a role in hemoptysis.   6. RLL PE - Seen on imaging at [facility] ([facility]A chest).  - Discussed with CCM, will try low therapeutic heparin gtt without bolus today, stop if further hemoptysis.   6. Hypothyroidism -Recently diagnosed. -TSH 13.5/Free T4 1.39. -On synthroid PTA.   7. Elevated INR - Possibly due to passive congestion from CHF. - Given Vit K 6/29, 6/30, and 7/1 per CCM.  8. Hyponatremia -Na 131 > 136 > 132 > 135>130 > 132 - Restrict free water  9. Acute blood loss anemia -Hgb 12.6 > 13.5 > 9.8> 9. Check daily CBC.  -Presented with massive hemoptysis which is now improved.  Starting to improve.  Remove foley, out of bed.   CRITICAL CARE Performed by: Marca Ancona  Total critical care time: 40 minutes  Critical care time was exclusive of separately billable procedures and treating other patients.  Critical care was necessary to treat or prevent imminent  or life-threatening deterioration.  Critical care was time spent personally by me (independent of midlevel providers or residents) on the following activities: development of treatment plan with patient and/or surrogate as well as nursing, discussions with consultants, evaluation of patient's response to treatment, examination of patient, obtaining history from patient or surrogate, ordering and performing treatments and interventions, ordering and review of laboratory studies, ordering and review of radiographic studies, pulse oximetry and re-evaluation of patient's condition.  Marca Ancona, MD  8:09 AM 02/10/2021

## 2021-02-10 NOTE — Plan of Care (Signed)
  Problem: Education: Goal: Knowledge of General Education information will improve Description: Including pain rating scale, medication(s)/side effects and non-pharmacologic comfort measures Outcome: Progressing   Problem: Clinical Measurements: Goal: Ability to maintain clinical measurements within normal limits will improve Outcome: Progressing Goal: Will remain free from infection Outcome: Progressing   

## 2021-02-11 ENCOUNTER — Inpatient Hospital Stay: Payer: Self-pay

## 2021-02-11 DIAGNOSIS — R042 Hemoptysis: Secondary | ICD-10-CM | POA: Diagnosis not present

## 2021-02-11 LAB — BASIC METABOLIC PANEL
Anion gap: 9 (ref 5–15)
Anion gap: 10 (ref 5–15)
BUN: 15 mg/dL (ref 6–20)
BUN: 14 mg/dL (ref 6–20)
CO2: 28 mmol/L (ref 22–32)
CO2: 28 mmol/L (ref 22–32)
Calcium: 7.1 mg/dL — ABNORMAL LOW (ref 8.9–10.3)
Calcium: 7.2 mg/dL — ABNORMAL LOW (ref 8.9–10.3)
Chloride: 99 mmol/L (ref 98–111)
Chloride: 97 mmol/L — ABNORMAL LOW (ref 98–111)
Creatinine, Ser: 0.92 mg/dL (ref 0.44–1.00)
Creatinine, Ser: 1.01 mg/dL — ABNORMAL HIGH (ref 0.44–1.00)
GFR, Estimated: >60 mL/min (ref >60)
GFR, Estimated: >60 mL/min (ref >60)
Glucose, Bld: 105 mg/dL — ABNORMAL HIGH (ref 70–99)
Glucose, Bld: 155 mg/dL — ABNORMAL HIGH (ref 70–99)
Potassium: 3.2 mmol/L — ABNORMAL LOW (ref 3.5–5.1)
Potassium: 3.6 mmol/L (ref 3.5–5.1)
Sodium: 136 mmol/L (ref 135–145)
Sodium: 135 mmol/L (ref 135–145)

## 2021-02-11 LAB — CBC
HCT: 29.6 % — ABNORMAL LOW (ref 36.0–46.0)
Hemoglobin: 10 g/dL — ABNORMAL LOW (ref 12.0–15.0)
MCH: 26.7 pg (ref 26.0–34.0)
MCHC: 33.8 g/dL (ref 30.0–36.0)
MCV: 78.9 fL — ABNORMAL LOW (ref 80.0–100.0)
Platelets: 189 10*3/uL (ref 150–400)
RBC: 3.75 MIL/uL — ABNORMAL LOW (ref 3.87–5.11)
RDW: 16.9 % — ABNORMAL HIGH (ref 11.5–15.5)
WBC: 17.4 10*3/uL — ABNORMAL HIGH (ref 4.0–10.5)
nRBC: 0.2 % (ref 0.0–0.2)

## 2021-02-11 LAB — HEPARIN LEVEL (UNFRACTIONATED)
Heparin Unfractionated: <0.1 IU/mL — ABNORMAL LOW (ref 0.30–0.70)
Heparin Unfractionated: <0.1 IU/mL — ABNORMAL LOW (ref 0.30–0.70)
Heparin Unfractionated: <0.1 IU/mL — ABNORMAL LOW (ref 0.30–0.70)

## 2021-02-11 LAB — MAGNESIUM: Magnesium: 1.1 mg/dL — ABNORMAL LOW (ref 1.7–2.4)

## 2021-02-11 LAB — GLUCOSE, CAPILLARY
Glucose-Capillary: 108 mg/dL — ABNORMAL HIGH (ref 70–99)
Glucose-Capillary: 142 mg/dL — ABNORMAL HIGH (ref 70–99)
Glucose-Capillary: 91 mg/dL (ref 70–99)
Glucose-Capillary: 122 mg/dL — ABNORMAL HIGH (ref 70–99)

## 2021-02-11 LAB — COOXEMETRY PANEL
Carboxyhemoglobin: 1.6 % — ABNORMAL HIGH (ref 0.5–1.5)
Methemoglobin: 0.8 % (ref 0.0–1.5)
O2 Saturation: 66.4 %
Total hemoglobin: 9.5 g/dL — ABNORMAL LOW (ref 12.0–16.0)

## 2021-02-11 LAB — LACTIC ACID, PLASMA
Lactic Acid, Venous: 2.1 mmol/L (ref 0.5–1.9)
Lactic Acid, Venous: 2.3 mmol/L (ref 0.5–1.9)

## 2021-02-11 MED ORDER — MAGNESIUM SULFATE 2 GM/50ML IV SOLN
2.0000 g | Freq: Once | INTRAVENOUS | Status: AC
  Administered 2021-02-11: 2 g via INTRAVENOUS
  Filled 2021-02-11: qty 50

## 2021-02-11 MED ORDER — POTASSIUM CHLORIDE 10 MEQ/50ML IV SOLN
10.0000 meq | INTRAVENOUS | Status: AC
  Administered 2021-02-11 (×4): 10 meq via INTRAVENOUS
  Filled 2021-02-11 (×4): qty 50

## 2021-02-11 MED ORDER — METOLAZONE 2.5 MG PO TABS
2.5000 mg | ORAL_TABLET | Freq: Once | ORAL | Status: AC
  Administered 2021-02-11: 2.5 mg via ORAL
  Filled 2021-02-11: qty 1

## 2021-02-11 MED ORDER — POTASSIUM CHLORIDE CRYS ER 20 MEQ PO TBCR
40.0000 meq | EXTENDED_RELEASE_TABLET | ORAL | Status: AC
  Administered 2021-02-11 (×3): 40 meq via ORAL
  Filled 2021-02-11 (×3): qty 2

## 2021-02-11 MED ORDER — MAGNESIUM SULFATE 4 GM/100ML IV SOLN
4.0000 g | Freq: Once | INTRAVENOUS | Status: AC
  Administered 2021-02-11: 4 g via INTRAVENOUS
  Filled 2021-02-11: qty 100

## 2021-02-11 MED ORDER — POTASSIUM CHLORIDE CRYS ER 20 MEQ PO TBCR: 40.0000 meq | EXTENDED_RELEASE_TABLET | Freq: Two times a day (BID) | ORAL | Status: DC

## 2021-02-11 NOTE — Progress Notes (Signed)
eLink Physician-Brief Progress Note Patient Name: Debra Daniel DOB: 11-01-1985 MRN: [id number]   Date of Service  02/11/2021  HPI/Events of Note  Hypomagnesemia - Mg++ = 1.1.  eICU Interventions  Will replace Mg++.     Intervention Category Major Interventions: Electrolyte abnormality - evaluation and management  [name] 02/11/2021, 5:53 AM

## 2021-02-11 NOTE — Progress Notes (Signed)
ANTICOAGULATION CONSULT NOTE Pharmacy Consult for heparin Indication: pulmonary embolus  Allergies  Allergen Reactions   Sulfa Antibiotics Hives   Ibuprofen Other (See Comments)    Constipation Can tolerate w milk of mag    Patient Measurements: Height: 5' 4.5" (163.8 cm) Weight: 101.1 kg (222 lb 14.2 oz) IBW/kg (Calculated) : 55.85 Heparin Dosing Weight: ~ 80 kg  Vital Signs: Temp: 98.2 F (36.8 C) (07/03 1937) Temp Source: Oral (07/03 1937) BP: 101/70 (07/03 2200) Pulse Rate: 111 (07/03 2200)  Labs: Recent Labs    02/09/21 1004 02/09/21 1230 02/10/21 0440 02/10/21 1542 02/10/21 1744 02/11/21 0414 02/11/21 1200 02/11/21 1400 02/11/21 2159  HGB  --    < > 9.2* 9.9*  --  10.0*  --   --   --   HCT  --    < > 28.0* 30.5*  --  29.6*  --   --   --   PLT  --    < > 164 178  --  189  --   --   --   LABPROT 18.6*  --   --   --   --   --   --   --   --   INR 1.6*  --   --   --   --   --   --   --   --   HEPARINUNFRC  --   --   --  >1.10*   < > <0.10* <0.10*  --  <0.10*  CREATININE  --   --  1.10*  --   --  0.92  --  1.01*  --    < > = values in this interval not displayed.     Estimated Creatinine Clearance: 92.6 mL/min (A) (by C-G formula based on SCr of 1.01 mg/dL (H)).  Assessment: 35 yo female with new PE for heparin. No AC PTA.  Heparin level remains undetectable, on 1500 units/hr. No s/sx of bleeding (just rusty colored sputum from previous hemoptysis) or infusion issues. Drawn appropriately.   Goal of Therapy:  Heparin level 0.2-0.4 Monitor platelets by anticoagulation protocol: Yes   Plan:  Increase heparin infusion to 1650 units/hr Check heparin level in 8 hours.  Daily heparin level and CBC.  Sherron Monday, PharmD, BCCCP Clinical Pharmacist  Phone: [phone] 02/11/2021 10:44 PM  Please check AMION for all [facility] phone numbers After 10:00 PM, call Main Pharmacy [phone]

## 2021-02-11 NOTE — Progress Notes (Signed)
Physical Therapy Treatment Patient Details Name: Debra Daniel MRN: [id number] DOB: 1986-08-09 Today's Date: 02/11/2021    History of Present Illness pt is a 35 y/o female presenting 6/30 from [facility] with report of mild hemoptysis, worsening LE edema and R sided segmental PE on CT.  Heparin caused significant worsening of her hemoptysis, so now not anticoagulated.  Edho demonstrated LVEF of 25-30% with mod-severe mitral regurgitation.  PMH  hypothyroid, UTI's    PT Comments    Pt performs transfers and ambulates without the need for physical assistance. Pt ambulates for limited community distances with use of RW and no overt LOB noted. Pt remains limited by fatigue and HR up to 144 with mobility. Pt continues to demonstrate generalized weakness and deficits in balance, activity tolerance, and gait and will benefit from continued acute PT to improve independence with mobility and return to prior level.    Follow Up Recommendations  No PT follow up;Other (comment)     Equipment Recommendations  None recommended by PT    Recommendations for Other Services       Precautions / Restrictions Precautions Precautions: Fall Restrictions Weight Bearing Restrictions: No    Mobility  Bed Mobility               General bed mobility comments: received in recliner    Transfers Overall transfer level: Needs assistance Equipment used: Rolling walker (2 wheeled) Transfers: Sit to/from Stand Sit to Stand: Modified independent (Device/Increase time)            Ambulation/Gait Ambulation/Gait assistance: Min guard;Supervision Gait Distance (Feet): 350 Feet Assistive device: Rolling walker (2 wheeled) Gait Pattern/deviations: Step-through pattern;Decreased stride length Gait velocity: reduced Gait velocity interpretation: >2.62 ft/sec, indicative of community ambulatory General Gait Details: slow steady step-through gait with RW, HR up to 144.   Stairs              Wheelchair Mobility    Modified Rankin (Stroke Patients Only)       Balance Overall balance assessment: Needs assistance Sitting-balance support: Feet supported;No upper extremity supported Sitting balance-Leahy Scale: Good     Standing balance support: During functional activity Standing balance-Leahy Scale: Fair Standing balance comment: pt maintains static and dynamic balance without reliance of UE support                            Cognition Arousal/Alertness: Awake/alert Behavior During Therapy: WFL for tasks assessed/performed Overall Cognitive Status: Within Functional Limits for tasks assessed                                        Exercises      General Comments General comments (skin integrity, edema, etc.): HR 113 at rest and up to 144 with mobility this session.      Pertinent Vitals/Pain Pain Assessment: No/denies pain    Home Living                      Prior Function            PT Goals (current goals can now be found in the care plan section) Acute Rehab PT Goals Patient Stated Goal: home independent when cleared. Progress towards PT goals: Progressing toward goals    Frequency    Min 3X/week      PT Plan Current plan remains appropriate  Co-evaluation              AM-PAC PT "6 Clicks" Mobility   Outcome Measure  Help needed turning from your back to your side while in a flat bed without using bedrails?: None Help needed moving from lying on your back to sitting on the side of a flat bed without using bedrails?: A Little Help needed moving to and from a bed to a chair (including a wheelchair)?: A Little Help needed standing up from a chair using your arms (e.g., wheelchair or bedside chair)?: None Help needed to walk in hospital room?: A Little Help needed climbing 3-5 steps with a railing? : A Little 6 Click Score: 20    End of Session Equipment Utilized During Treatment: Gait  belt Activity Tolerance: Patient limited by fatigue Patient left: in chair;with call bell/phone within reach;with family/visitor present Nurse Communication: Mobility status PT Visit Diagnosis: Muscle weakness (generalized) (M62.81);Difficulty in walking, not elsewhere classified (R26.2);Other abnormalities of gait and mobility (R26.89)     Time: 9702-6378 PT Time Calculation (min) (ACUTE ONLY): 15 min  Charges:  $Therapeutic Activity: 8-22 mins                     Acute Rehab  Pager: [phone]    Waldemar Dickens, SPT 02/11/2021, 5:02 PM

## 2021-02-11 NOTE — Progress Notes (Signed)
Date and time results received: 02/11/21 1100  Test: Lactic Acid Critical Value: 2.1  Name of Provider Notified: Dr Lynnell Catalan  Orders Received? Or Actions Taken?: None

## 2021-02-11 NOTE — Progress Notes (Signed)
Patient ID: Debra Daniel, female   DOB: [birth year], 35 y.o.   MRN: [id number]     Advanced Heart Failure Rounding Note  PCP-Cardiologist: None   Subjective:   -05/13: Seen in ED for recurrent nausea and vomiting. Treated for possible UTI. Ongoing weakness, fatigue, intermittent N/V, cough since. -06/27: ED at OSH with hemoptysis. Found to have segmental PE and pneumonia. EF 25-30%. -06/28: Transferred to [facility]. Cardiogenic shock > milrinone 0.25 mcg. Diuresed with IV lasix. -06/29: Tachycardic. MAT noted on tele. Added digoxin and spiro. Amio added d/t NSVT. Last dose IV lasix. Norepi off. - 6/30: midodrine started and lasix drip started.   Off norepinephrine, remains on milrinone 0.375 mcg. Unable to draw co-ox this morning from PICC.  Creatinine lower 0.92. Still tachycardic with HR 110s.   Diuresing with lasix drip 8 mg per hour.  Weight down 5 lbs, suspect all UOP may not have been recorded (reports multiple BMs with urine output also).  CVP 12 today.   Hemoptysis resolved, back on heparin gtt wiiht hgb up to 10.  She remains on ceftriaxone for PNA.   Has RLL PE seen at [facility] on CTA chest.    Feeling better, on 2L oxygen. Walked around unit this morning already.   Echo 02/06/2021: LVEF 10-15%, LV moderately dilated, RV systolic function severely reduced, RV moderately enlarged, RVSP 45.9 mmHg, severe secondary MR, mod-severe TR (personally reviewed).  Objective:   Weight Range: 101.1 kg Body mass index is 37.67 kg/m.   Vital Signs:   Temp:  [97.8 F (36.6 C)-98.4 F (36.9 C)] 98.1 F (36.7 C) (07/03 0700) Pulse Rate:  [70-137] 121 (07/03 0700) Resp:  [4-39] 25 (07/03 0700) BP: (87-163)/(55-135) 89/62 (07/03 0500) SpO2:  [85 %-100 %] 100 % (07/03 0700) FiO2 (%):  [28 %] 28 % (07/02 1052) Weight:  [101.1 kg] 101.1 kg (07/03 0500) Last BM Date: 02/10/21  Weight change: Filed Weights   02/08/21 0500 02/10/21 0431 02/11/21 0500  Weight: 104.9 kg 103.2 kg 101.1 kg     Intake/Output:   Intake/Output Summary (Last 24 hours) at 02/11/2021 0808 Last data filed at 02/11/2021 0700 Gross per 24 hour  Intake 1158.84 ml  Output 1600 ml  Net -441.16 ml      Physical Exam   CVP 12 General: NAD Neck: JVP 10-12 cm, no thyromegaly or thyroid nodule.  Lungs: Clear to auscultation bilaterally with normal respiratory effort. CV: Nondisplaced PMI.  Heart tachy, regular S1/S2, no S3/S4, no murmur.  1+ edema to knees.  Abdomen: Soft, nontender, no hepatosplenomegaly, no distention.  Skin: Intact without lesions or rashes.  Neurologic: Alert and oriented x 3.  Psych: Normal affect. Extremities: No clubbing or cyanosis.  HEENT: Normal.    Telemetry  ST 110s (personally reviewed)  Labs    CBC Recent Labs    02/10/21 1542 02/11/21 0414  WBC 17.4* 17.4*  HGB 9.9* 10.0*  HCT 30.5* 29.6*  MCV 81.1 78.9*  PLT 178 189   Basic Metabolic Panel Recent Labs    96/78/93 0314 02/10/21 0440 02/11/21 0414  NA 130* 132* 136  K 4.2 3.1* 3.2*  CL 96* 96* 99  CO2 22 27 28   GLUCOSE 256* 190* 105*  BUN 21* 20 15  CREATININE 1.27* 1.10* 0.92  CALCIUM 7.5* 7.2* 7.1*  MG 1.8  --  1.1*   Liver Function Tests Recent Labs    02/09/21 0314  AST 28  ALT 35  ALKPHOS 48  BILITOT 2.4*  PROT 4.6*  ALBUMIN 1.8*   No results for input(s): LIPASE, AMYLASE in the last 72 hours. Cardiac Enzymes No results for input(s): CKTOTAL, CKMB, CKMBINDEX, TROPONINI in the last 72 hours.  BNP: BNP (last 3 results) Recent Labs    02/06/21 2018  BNP 1,571.8*    ProBNP (last 3 results) No results for input(s): PROBNP in the last 8760 hours.   D-Dimer No results for input(s): DDIMER in the last 72 hours.  Hemoglobin A1C Recent Labs    02/09/21 1004  HGBA1C 6.7*   Fasting Lipid Panel No results for input(s): CHOL, HDL, LDLCALC, TRIG, CHOLHDL, LDLDIRECT in the last 72 hours. Thyroid Function Tests No results for input(s): TSH, T4TOTAL, T3FREE, THYROIDAB in the  last 72 hours.  Invalid input(s): FREET3   Other results:   Imaging    [place] EKG SITE RITE  Result Date: 02/11/2021 If Site Rite image not attached, placement could not be confirmed due to current cardiac rhythm.    Medications:     Scheduled Medications:  Chlorhexidine Gluconate Cloth  6 each Topical Daily   digoxin  0.125 mg Oral Daily   insulin aspart  0-5 Units Subcutaneous QHS   insulin aspart  0-9 Units Subcutaneous TID WC   midodrine  5 mg Oral TID WC   potassium chloride  40 mEq Oral Q4H   [START ON 02/12/2021] potassium chloride  40 mEq Oral BID   sodium chloride flush  10-40 mL Intracatheter Q12H   spironolactone  12.5 mg Oral Daily   tranexamic acid  500 mg Nebulization Q8H    Infusions:  sodium chloride Stopped (02/08/21 1759)   sodium chloride Stopped (02/08/21 0434)   sodium chloride Stopped (02/11/21 0527)   amiodarone 30 mg/hr (02/11/21 0700)   cefTRIAXone (ROCEPHIN)  IV Stopped (02/10/21 1225)   furosemide (LASIX) 200 mg in dextrose 5% 100 mL (2mg /mL) infusion 8 mg/hr (02/11/21 0700)   heparin 1,350 Units/hr (02/11/21 0700)   magnesium sulfate bolus IVPB     milrinone 0.375 mcg/kg/min (02/11/21 0700)   norepinephrine (LEVOPHED) Adult infusion 4 mcg/min (02/09/21 2229)   potassium chloride 10 mEq (02/11/21 0728)    PRN Medications: sodium chloride, sodium chloride, lip balm, ondansetron (ZOFRAN) IV, sodium chloride flush    Assessment/Plan   Acute systolic HF -> Cardiogenic shock/acute biventricular heart failure/new cardiomyopathy -New diagnosis of CHF this admission after presenting with hemoptysis at OSH in [place]. Suspected viral illness with > 1 month hx weakness, fatigue, cough, N/V. HIV negative. Recently diagnosed with hypothyroidism.  Etiology => viral myocarditis versus familial cardiomyopathy (mother and aunts/uncles with CHF), CAD less likely but not ruled out.  - Echo here with EF of 10-15%, mildly dilated LV, moderate to severely  reduced RV fxn with mildly dilated RV, RVSP 45 mmHg, severe secondary MR, mod-severe TR. - BNP 1,571. Pregnancy test negative.  - Lactic acid 4.3 > 2.3 > 1.9 > 1.6 - Currently on 0.375 mcg milrinone, off NE. Creatinine lower, unable to get co-ox from PICC. Continue current milrinone, will nurse and IV team to work on PICC this morning => able to obtain and send.   - Continue Lasix at 10 mg/hr today with CVP 12, weight coming down nicely and creatinine lower.   - Continue low dose midodrine 5 mg tid for now, SBP 90s.  - Continue dig and spiro.  - No beta blocker or entresto due to shock. - Will need cardiac MRI once more stable. - LHC/RHC will be needed likely Tuesday, discussed with patient.  2. Mitral valve regurgitation - Severe on echo this admission.  - Likely secondary, valve does not look abnormal on echo.   3. Moderate-severe TR - Likely secondary  4. NSVT - No further NSVT but has PVCs.  - Continue amio drip 30 mg per hour.  - Keep K> 4.0 Mg > 2.0 => needs aggressive replacement today and repeat BMET in pm.   5. Hemoptysis/community acquired pneumonia - Possible PE noted on imaging at OSH. Suspect course of events likely viral illness/myocarditis > inactivity, followed by PE and pneumonia. Venous duplex negative for DVT. - CT chest here with consolidation both lower lobes. Ground glass opacities right middle lobe. - On ceftriaxone for CAP.  - WBC 17.0. > 19.6> 19 > 17.9 - Hgb 10, no further hemoptysis. Now back on heparin for PE.   6. RLL PE - Seen on [facility] at [facility] ([facility] chest).  - Now back on heparin gtt.    6. Hypothyroidism -Recently diagnosed. -TSH 13.5/Free T4 1.39. -On synthroid PTA.   7. Elevated INR - Possibly due to passive congestion from CHF. - Given Vit K 6/29, 6/30, and 7/1 per CCM.  8. Hyponatremia - Resolved.  - Restrict free water  9. Acute blood loss anemia -Hgb 12.6 > 13.5 > 9.8> 9 > 10 -Presented with massive hemoptysis which is now  improved.  Starting to improve.  Continue ambulation.   CRITICAL CARE Performed by: Marca Ancona  Total critical care time: 40 minutes  Critical care time was exclusive of separately billable procedures and treating other patients.  Critical care was necessary to treat or prevent imminent or life-threatening deterioration.  Critical care was time spent personally by me (independent of midlevel providers or residents) on the following activities: development of treatment plan with patient and/or surrogate as well as nursing, discussions with consultants, evaluation of patient's response to treatment, examination of patient, obtaining history from patient or surrogate, ordering and performing treatments and interventions, ordering and review of laboratory studies, ordering and review of radiographic studies, pulse oximetry and re-evaluation of patient's condition.  Marca Ancona, MD  8:08 AM 02/11/2021

## 2021-02-11 NOTE — Progress Notes (Signed)
NAME:  Debra Daniel, MRN:  [id number], DOB:  Oct 28, 1985, LOS: 5 ADMISSION DATE:  02/06/2021, CONSULTATION DATE:  6/28 REFERRING MD:  Dr. Adela Glimpse, CHIEF COMPLAINT:  hemoptysis   History of Present Illness:  35 year old female with no significant past medical history up until quite recently. Three months prior to admission she was diagnosed with hypothyroid and was started on synthroid. Then, approximately 3 months prior to presentation she developed UTI symptoms. She is unclear what medications she was treated with, although she remembers the first one was ineffective and a second antibiotic was needed. Progressive lower extremity edema since that time as well. Then 6/25 she began to experience mild hemoptysis, which prompted her to present to [facility] 6/27. Workup in the ED included CTA chest and CT abdomen/pelvis. She was found to have right sided segmental PE as well a crazy paving pattern on the CT chest. CT abdomen was non-acute. She was admitted. Started on heparin for PE, which caused her hemoptysis to become much worse. Heparin was stopped. Echocardiogram done demonstrated LVEF 25-30% as well as mod-severe mitral regurgitation. She was transferred to [facility] for further evaluation.   Pertinent  Medical History   has a past medical history of Encounter for menstrual regulation (01/24/2015), Hypothyroid, Migraines, Obesity, and UTI (urinary tract infection).    has a past medical history of Encounter for menstrual regulation (01/24/2015), Hypothyroid, Migraines, Obesity, and UTI (urinary tract infection).   reports that she has never smoked. She has never used smokeless tobacco.   Significant Hospital Events: Including procedures, antibiotic start and stop dates in addition to other pertinent events   6/27 admit to [place] for hemotpysis. Dx PE. Worsening hemoptysis  6/28 tx to [facility] > then to ICU due to hypotension, hemoptysis.  6/29 milrinone, NE, UOP picking up,  PICC placed 6/30 hemoptysis improving but present, Cr better, feels better, on milrinone 7/1 going to add some txa nebs 7/2 no further hemoptysis and no SCVO2 better. Started on low dose heparin   Interim History / Subjective:  Minimal cough and no frank hemoptysis - mild blood tinged sputum.  Objective   Blood pressure (!) 107/96, pulse (!) 106, temperature 98.1 F (36.7 C), temperature source Oral, resp. rate (!) 40, height 5' 4.5" (1.638 m), weight 101.1 kg, last menstrual period 10/24/2020, SpO2 93 %. CVP:  [3 mmHg-15 mmHg] 15 mmHg  FiO2 (%):  [28 %] 28 %   Intake/Output Summary (Last 24 hours) at 02/11/2021 1001 Last data filed at 02/11/2021 0900 Gross per 24 hour  Intake 1286.72 ml  Output 1600 ml  Net -313.28 ml    Filed Weights   02/08/21 0500 02/10/21 0431 02/11/21 0500  Weight: 104.9 kg 103.2 kg 101.1 kg    Examination: General:  adult F sitting in chair NAD  HENT: NCAT pink mm. Scant dried blood on lips  Lungs: Even unlabored on RA. No adventitial sounds. Symmetrical chest expansion.  Cardiovascular: tachy. S1s2 cap refill brisk, Gallop LV heave.  Abdomen: soft round ndnt + bowel sounds  Extremities:  BLE edema. No acute joint deformity no cyanosis or clubbing  Neuro: AAOx4 following commands no focal deficit   LABS    PULMONARY Recent Labs  Lab 02/09/21 1004 02/09/21 1425 02/09/21 1600 02/10/21 0440 02/11/21 0805  O2SAT 42.2 49.4 48.1 84.1 66.4     CBC Recent Labs  Lab 02/10/21 0440 02/10/21 1542 02/11/21 0414  HGB 9.2* 9.9* 10.0*  HCT 28.0* 30.5* 29.6*  WBC 19.1* 17.4*  17.4*  PLT 164 178 189     COAGULATION Recent Labs  Lab 02/06/21 2008 02/06/21 2018 02/07/21 1808 02/08/21 1017 02/09/21 1004  INR 1.9* 1.9* 1.8* 1.6* 1.6*     CARDIAC  No results for input(s): TROPONINI in the last 168 hours. No results for input(s): PROBNP in the last 168 hours.   CHEMISTRY Recent Labs  Lab 02/06/21 2008 02/07/21 0226 02/07/21 1808  02/08/21 0334 02/08/21 1732 02/09/21 0314 02/10/21 0440 02/11/21 0414  NA 131*   < > 132* 135 132* 130* 132* 136  K 4.0   < > 3.1* 3.1* 4.8 4.2 3.1* 3.2*  CL 96*   < > 94* 101 98 96* 96* 99  CO2 18*   < > 24 22 23 22 27 28   GLUCOSE 135*   < > 319* 161* 205* 256* 190* 105*  BUN 21*   < > 18 17 22* 21* 20 15  CREATININE 1.13*   < > 1.05* 0.99 1.31* 1.27* 1.10* 0.92  CALCIUM 7.9*   < > 6.9* 6.7* 7.5* 7.5* 7.2* 7.1*  MG 1.6*  --  1.5* 1.7  --  1.8  --  1.1*   < > = values in this interval not displayed.    Estimated Creatinine Clearance: 101.6 mL/min (by C-G formula based on SCr of 0.92 mg/dL).   LIVER Recent Labs  Lab 02/06/21 2008 02/06/21 2018 02/07/21 1808 02/08/21 1017 02/09/21 0314 02/09/21 1004  AST 39  --   --   --  28  --   ALT 55*  --   --   --  35  --   ALKPHOS 61  --   --   --  48  --   BILITOT 3.5*  --   --   --  2.4*  --   PROT 5.2*  --   --   --  4.6*  --   ALBUMIN 2.4*  --   --   --  1.8*  --   INR 1.9* 1.9* 1.8* 1.6*  --  1.6*      INFECTIOUS Recent Labs  Lab 02/06/21 1919 02/06/21 2017 02/07/21 0226 02/07/21 0542 02/08/21 0334 02/08/21 0623 02/09/21 1800 02/10/21 0440 02/11/21 0800  LATICACIDVEN  --    < >  --    < >  --    < > 3.4* 1.6 2.1*  PROCALCITON 2.31  --  2.54  --  1.68  --   --   --   --    < > = values in this interval not displayed.      ENDOCRINE CBG (last 3)  Recent Labs    02/10/21 1607 02/10/21 2158 02/11/21 0613  GLUCAP 110* 92 108*     IMAGING x48h  - image(s) personally visualized  -   highlighted in bold 04/14/21 EKG SITE RITE  Result Date: 02/11/2021 If Site Rite image not attached, placement could not be confirmed due to current cardiac rhythm.    Resolved Hospital Problem list   AGMA  Assessment & Plan:   Acute exacerbation of newly diagnosis biventricular heart failure with cardiogenic shock MR, TR Acute hypoxic respiratory failure  CAP  GGOs  Hemoptysis  Reported Pulmonary Embolism per OSH report   AKI  Improved with diuresis, inotrope. Hyponatremia, mild Elevated LFTs -- congestive from biV failure  Elevated INRsuspect related to congestive hepatopathy  Hypothyroidism Microscopic hematuria  Plan:  - Increase diuresis today. Add single dose of metolazone - Stopped NE  but continue current milrinone.  ScvO2 adequate but lactate mildly elevated - Amiodarone and digoxin for AF. -Continue low-dose heparin.  Suspect hemoptysis is from high left-sided cardiac pressures. - complete antibiotics.   Best Practice (right click and "Reselect all SmartList Selections" daily)   Diet/type: Regular consistency (see orders) DVT prophylaxis: SCD GI prophylaxis: PPI Lines: N/A, Central line, and yes and it is still needed Foley:  Yes, and it is still needed Code Status:  full code Last date of multidisciplinary goals of care discussion [ 6/28]  CRITICAL CARE Performed by: Lynnell Catalan   Total critical care time:  Critical care time was exclusive of separately billable procedures and treating other patients.  Critical care was necessary to treat or prevent imminent or life-threatening deterioration.  Critical care was time spent personally by me on the following activities: development of treatment plan with patient and/or surrogate as well as nursing, discussions with consultants, evaluation of patient's response to treatment, examination of patient, obtaining history from patient or surrogate, ordering and performing treatments and interventions, ordering and review of laboratory studies, ordering and review of radiographic studies, pulse oximetry and re-evaluation of patient's condition.  Lynnell Catalan, MD [facility] ICU Physician [facility]  Pager: [phone] Or Epic Secure Chat After hours: [phone].  02/11/2021, 10:01 AM

## 2021-02-11 NOTE — Progress Notes (Signed)
eLink Physician-Brief Progress Note Patient Name: Debra Daniel DOB: [birth year] MRN: [id number]   Date of Service  02/11/2021  HPI/Events of Note  Hypokalemia - K+ = 3.2 and Creatinine = 0.92. Ca++ = 7.1 which corrects to 8.86 (Normal) given Albumin - 1.8.  eICU Interventions  Will replace K+.     Intervention Category Major Interventions: Electrolyte abnormality - evaluation and management  [name] 02/11/2021, 5:10 AM

## 2021-02-11 NOTE — Progress Notes (Signed)
ANTICOAGULATION CONSULT NOTE Pharmacy Consult for heparin Indication: pulmonary embolus  Allergies  Allergen Reactions   Sulfa Antibiotics Hives   Ibuprofen Other (See Comments)    Constipation Can tolerate w milk of mag    Patient Measurements: Height: 5' 4.5" (163.8 cm) Weight: 103.2 kg (227 lb 8.2 oz) IBW/kg (Calculated) : 55.85 Heparin Dosing Weight: ~ 80 kg  Vital Signs: Temp: 98.4 F (36.9 C) (07/03 0404) Temp Source: Oral (07/03 0404) BP: 91/58 (07/03 0400) Pulse Rate: 113 (07/03 0400)  Labs: Recent Labs    02/08/21 1017 02/08/21 1732 02/09/21 0314 02/09/21 1004 02/09/21 1230 02/10/21 0440 02/10/21 1542 02/10/21 1744 02/11/21 0414  HGB  --   --   --   --    < > 9.2* 9.9*  --  10.0*  HCT  --   --   --   --    < > 28.0* 30.5*  --  29.6*  PLT  --   --   --   --    < > 164 178  --  189  LABPROT 19.0*  --   --  18.6*  --   --   --   --   --   INR 1.6*  --   --  1.6*  --   --   --   --   --   HEPARINUNFRC  --   --   --   --   --   --  >1.10* <0.10* <0.10*  CREATININE  --    < > 1.27*  --   --  1.10*  --   --  0.92   < > = values in this interval not displayed.     Estimated Creatinine Clearance: 102.7 mL/min (by C-G formula based on SCr of 0.92 mg/dL).  Assessment: 35 yo female with new PE for heparin.    Goal of Therapy:  Heparin level 0.2-0.4 Monitor platelets by anticoagulation protocol: Yes   Plan:  Increase Heparin 1350 units/hr Check heparin level in 6 hours.   Geannie Risen, PharmD, BCPS

## 2021-02-11 NOTE — Progress Notes (Signed)
ANTICOAGULATION CONSULT NOTE Pharmacy Consult for heparin Indication: pulmonary embolus  Allergies  Allergen Reactions   Sulfa Antibiotics Hives   Ibuprofen Other (See Comments)    Constipation Can tolerate w milk of mag    Patient Measurements: Height: 5' 4.5" (163.8 cm) Weight: 101.1 kg (222 lb 14.2 oz) IBW/kg (Calculated) : 55.85 Heparin Dosing Weight: ~ 80 kg  Vital Signs: Temp: 98.1 F (36.7 C) (07/03 1100) Temp Source: Oral (07/03 1100) BP: 97/60 (07/03 1500) Pulse Rate: 114 (07/03 1500)  Labs: Recent Labs    02/09/21 1004 02/09/21 1230 02/10/21 0440 02/10/21 0440 02/10/21 1542 02/10/21 1744 02/11/21 0414 02/11/21 1200 02/11/21 1400  HGB  --    < > 9.2*  --  9.9*  --  10.0*  --   --   HCT  --    < > 28.0*  --  30.5*  --  29.6*  --   --   PLT  --    < > 164  --  178  --  189  --   --   LABPROT 18.6*  --   --   --   --   --   --   --   --   INR 1.6*  --   --   --   --   --   --   --   --   HEPARINUNFRC  --   --   --    < > >1.10* <0.10* <0.10* <0.10*  --   CREATININE  --   --  1.10*  --   --   --  0.92  --  1.01*   < > = values in this interval not displayed.     Estimated Creatinine Clearance: 92.6 mL/min (A) (by C-G formula based on SCr of 1.01 mg/dL (H)).  Assessment: 35 yo female with new PE for heparin.    Goal of Therapy:  Heparin level 0.2-0.4 Monitor platelets by anticoagulation protocol: Yes   Plan:  Increase Heparin 1500 units/hr Check heparin level in 8 hours.  Daily heparin level and CBC.  Reece Leader, Colon Flattery, BCCP Clinical Pharmacist  02/11/2021 3:34 PM   [facility] pharmacy phone numbers are listed on [email or web address]

## 2021-02-12 DIAGNOSIS — R042 Hemoptysis: Secondary | ICD-10-CM | POA: Diagnosis not present

## 2021-02-12 LAB — BASIC METABOLIC PANEL
Anion gap: 9 (ref 5–15)
Anion gap: 11 (ref 5–15)
BUN: 15 mg/dL (ref 6–20)
BUN: 16 mg/dL (ref 6–20)
CO2: 32 mmol/L (ref 22–32)
CO2: 30 mmol/L (ref 22–32)
Calcium: 7.3 mg/dL — ABNORMAL LOW (ref 8.9–10.3)
Calcium: 7.3 mg/dL — ABNORMAL LOW (ref 8.9–10.3)
Chloride: 94 mmol/L — ABNORMAL LOW (ref 98–111)
Chloride: 88 mmol/L — ABNORMAL LOW (ref 98–111)
Creatinine, Ser: 1.29 mg/dL — ABNORMAL HIGH (ref 0.44–1.00)
Creatinine, Ser: 1.06 mg/dL — ABNORMAL HIGH (ref 0.44–1.00)
GFR, Estimated: 56 mL/min — ABNORMAL LOW (ref >60)
GFR, Estimated: >60 mL/min (ref >60)
Glucose, Bld: 162 mg/dL — ABNORMAL HIGH (ref 70–99)
Glucose, Bld: 193 mg/dL — ABNORMAL HIGH (ref 70–99)
Potassium: 2.8 mmol/L — ABNORMAL LOW (ref 3.5–5.1)
Potassium: 3 mmol/L — ABNORMAL LOW (ref 3.5–5.1)
Sodium: 135 mmol/L (ref 135–145)
Sodium: 129 mmol/L — ABNORMAL LOW (ref 135–145)

## 2021-02-12 LAB — CBC
HCT: 29.2 % — ABNORMAL LOW (ref 36.0–46.0)
Hemoglobin: 9.4 g/dL — ABNORMAL LOW (ref 12.0–15.0)
MCH: 25.8 pg — ABNORMAL LOW (ref 26.0–34.0)
MCHC: 32.2 g/dL (ref 30.0–36.0)
MCV: 80.2 fL (ref 80.0–100.0)
Platelets: 227 10*3/uL (ref 150–400)
RBC: 3.64 MIL/uL — ABNORMAL LOW (ref 3.87–5.11)
RDW: 17.1 % — ABNORMAL HIGH (ref 11.5–15.5)
WBC: 17.9 10*3/uL — ABNORMAL HIGH (ref 4.0–10.5)
nRBC: 0.1 % (ref 0.0–0.2)

## 2021-02-12 LAB — GLUCOSE, CAPILLARY
Glucose-Capillary: 90 mg/dL (ref 70–99)
Glucose-Capillary: 108 mg/dL — ABNORMAL HIGH (ref 70–99)
Glucose-Capillary: 112 mg/dL — ABNORMAL HIGH (ref 70–99)
Glucose-Capillary: 113 mg/dL — ABNORMAL HIGH (ref 70–99)

## 2021-02-12 LAB — HEPARIN LEVEL (UNFRACTIONATED)
Heparin Unfractionated: <0.1 IU/mL — ABNORMAL LOW (ref 0.30–0.70)
Heparin Unfractionated: <0.1 IU/mL — ABNORMAL LOW (ref 0.30–0.70)

## 2021-02-12 LAB — COOXEMETRY PANEL
Carboxyhemoglobin: 1.6 % — ABNORMAL HIGH (ref 0.5–1.5)
Methemoglobin: 0.8 % (ref 0.0–1.5)
O2 Saturation: 68.7 %
Total hemoglobin: 8.3 g/dL — ABNORMAL LOW (ref 12.0–16.0)

## 2021-02-12 LAB — DIGOXIN LEVEL: Digoxin Level: 0.3 ng/mL — ABNORMAL LOW (ref 0.8–2.0)

## 2021-02-12 LAB — MAGNESIUM: Magnesium: 1.6 mg/dL — ABNORMAL LOW (ref 1.7–2.4)

## 2021-02-12 MED ORDER — SODIUM CHLORIDE 0.9 % IV SOLN: 250.0000 mL | INTRAVENOUS | Status: DC | PRN

## 2021-02-12 MED ORDER — POTASSIUM CHLORIDE CRYS ER 20 MEQ PO TBCR: 60.0000 meq | EXTENDED_RELEASE_TABLET | ORAL | Status: DC

## 2021-02-12 MED ORDER — SODIUM CHLORIDE 0.9% FLUSH: 3.0000 mL | Freq: Two times a day (BID) | INTRAVENOUS | Status: DC

## 2021-02-12 MED ORDER — POTASSIUM CHLORIDE CRYS ER 20 MEQ PO TBCR: 40.0000 meq | EXTENDED_RELEASE_TABLET | Freq: Two times a day (BID) | ORAL | Status: DC

## 2021-02-12 MED ORDER — SODIUM CHLORIDE 0.9% FLUSH: 3.0000 mL | INTRAVENOUS | Status: DC | PRN

## 2021-02-12 MED ORDER — POTASSIUM CHLORIDE CRYS ER 20 MEQ PO TBCR
40.0000 meq | EXTENDED_RELEASE_TABLET | ORAL | Status: DC
  Administered 2021-02-12 (×2): 40 meq via ORAL
  Filled 2021-02-12 (×2): qty 2

## 2021-02-12 MED ORDER — ORAL CARE MOUTH RINSE
15.0000 mL | Freq: Two times a day (BID) | OROMUCOSAL | Status: DC
  Administered 2021-02-12 – 2021-02-21 (×20): 15 mL via OROMUCOSAL

## 2021-02-12 MED ORDER — SODIUM CHLORIDE 0.9 % IV SOLN: INTRAVENOUS | Status: DC

## 2021-02-12 MED ORDER — TORSEMIDE 20 MG PO TABS
40.0000 mg | ORAL_TABLET | Freq: Every day | ORAL | Status: DC
  Administered 2021-02-12 – 2021-02-13 (×2): 40 mg via ORAL
  Filled 2021-02-12 (×2): qty 2

## 2021-02-12 MED ORDER — ASPIRIN 81 MG PO CHEW
81.0000 mg | CHEWABLE_TABLET | ORAL | Status: AC
  Administered 2021-02-13: 81 mg via ORAL
  Filled 2021-02-12: qty 1

## 2021-02-12 MED ORDER — POTASSIUM CHLORIDE 10 MEQ/50ML IV SOLN
10.0000 meq | INTRAVENOUS | Status: AC
  Administered 2021-02-12 (×4): 10 meq via INTRAVENOUS
  Filled 2021-02-12 (×4): qty 50

## 2021-02-12 MED ORDER — MAGNESIUM SULFATE 4 GM/100ML IV SOLN
4.0000 g | Freq: Once | INTRAVENOUS | Status: AC
  Administered 2021-02-12: 4 g via INTRAVENOUS
  Filled 2021-02-12: qty 100

## 2021-02-12 MED ORDER — POTASSIUM CHLORIDE CRYS ER 20 MEQ PO TBCR
40.0000 meq | EXTENDED_RELEASE_TABLET | ORAL | Status: AC
  Administered 2021-02-12 – 2021-02-13 (×4): 40 meq via ORAL
  Filled 2021-02-12 (×4): qty 2

## 2021-02-12 MED ORDER — MAGNESIUM SULFATE 2 GM/50ML IV SOLN
2.0000 g | Freq: Once | INTRAVENOUS | Status: AC
  Administered 2021-02-12: 2 g via INTRAVENOUS
  Filled 2021-02-12: qty 50

## 2021-02-12 NOTE — Progress Notes (Signed)
Patient ID: Debra Daniel, female   DOB: October 14, 1985, 35 y.o.   MRN: [id number]      Advanced Heart Failure Rounding Note  PCP-Cardiologist: None   Subjective:   -05/13: Seen in ED for recurrent nausea and vomiting. Treated for possible UTI. Ongoing weakness, fatigue, intermittent N/V, cough since. -06/27: ED at OSH with hemoptysis. Found to have segmental PE and pneumonia. EF 25-30%. -06/28: Transferred to [facility]. Cardiogenic shock > milrinone 0.25 mcg. Diuresed with IV lasix. -06/29: Tachycardic. MAT noted on tele. Added digoxin and spiro. Amio added d/t NSVT. Last dose IV lasix. Norepi off. - 6/30: midodrine started and lasix drip started.   Off norepinephrine, remains on milrinone 0.375 mcg. Still tachycardic with HR 110s.  Co-ox excellent at 69% this morning though lactate was still mildly elevated yesterday am.    Diuresing with lasix drip 10 mg per hour, got a dose of metolazone.  Weight down 5 lbs again, CVP 7-8 this morning.  Breathing better, off oxygen now.    Hemoptysis resolved, back on heparin gtt with hgb 9.4.  She is now off antibiotics.   Has RLL PE seen at [facility] on CTA chest.    Walked around unit this morning already.   Echo 02/06/2021: LVEF 10-15%, LV moderately dilated, RV systolic function severely reduced, RV moderately enlarged, RVSP 45.9 mmHg, severe secondary MR, mod-severe TR (personally reviewed).  Objective:   Weight Range: 98.5 kg Body mass index is 36.7 kg/m.   Vital Signs:   Temp:  [98 F (36.7 C)-98.2 F (36.8 C)] 98 F (36.7 C) (07/04 0323) Pulse Rate:  [101-116] 108 (07/04 0700) Resp:  [15-43] 22 (07/04 0700) BP: (80-111)/(55-82) 99/74 (07/04 0700) SpO2:  [92 %-100 %] 92 % (07/04 0700) Weight:  [98.5 kg] 98.5 kg (07/04 0500) Last BM Date: 02/12/21  Weight change: Filed Weights   02/10/21 0431 02/11/21 0500 02/12/21 0500  Weight: 103.2 kg 101.1 kg 98.5 kg    Intake/Output:   Intake/Output Summary (Last 24 hours) at 02/12/2021  0823 Last data filed at 02/12/2021 0730 Gross per 24 hour  Intake 1647.28 ml  Output 5950 ml  Net -4302.72 ml      Physical Exam   CVP 7-8 General: NAD Neck: No JVD, no thyromegaly or thyroid nodule.  Lungs: Clear to auscultation bilaterally with normal respiratory effort. CV: Lateral PMI.  Heart mildly tachy, regular S1/S2, no S3/S4, no murmur.  1+ ankle edema.  Abdomen: Soft, nontender, no hepatosplenomegaly, no distention.  Skin: Intact without lesions or rashes.  Neurologic: Alert and oriented x 3.  Psych: Normal affect. Extremities: No clubbing or cyanosis.  HEENT: Normal.    Telemetry  ST 110s (personally reviewed)  Labs    CBC Recent Labs    02/11/21 0414 02/12/21 0332  WBC 17.4* 17.9*  HGB 10.0* 9.4*  HCT 29.6* 29.2*  MCV 78.9* 80.2  PLT 189 227   Basic Metabolic Panel Recent Labs    19/50/93 0414 02/11/21 1400 02/12/21 0332  NA 136 135 135  K 3.2* 3.6 2.8*  CL 99 97* 94*  CO2 28 28 32  GLUCOSE 105* 155* 162*  BUN 15 14 15   CREATININE 0.92 1.01* 1.29*  CALCIUM 7.1* 7.2* 7.3*  MG 1.1*  --  1.6*   Liver Function Tests No results for input(s): AST, ALT, ALKPHOS, BILITOT, PROT, ALBUMIN in the last 72 hours.  No results for input(s): LIPASE, AMYLASE in the last 72 hours. Cardiac Enzymes No results for input(s): CKTOTAL, CKMB, CKMBINDEX, TROPONINI in  the last 72 hours.  BNP: BNP (last 3 results) Recent Labs    02/06/21 2018  BNP 1,571.8*    ProBNP (last 3 results) No results for input(s): PROBNP in the last 8760 hours.   D-Dimer No results for input(s): DDIMER in the last 72 hours.  Hemoglobin A1C Recent Labs    02/09/21 1004  HGBA1C 6.7*   Fasting Lipid Panel No results for input(s): CHOL, HDL, LDLCALC, TRIG, CHOLHDL, LDLDIRECT in the last 72 hours. Thyroid Function Tests No results for input(s): TSH, T4TOTAL, T3FREE, THYROIDAB in the last 72 hours.  Invalid input(s): FREET3   Other results:   Imaging    No results  found.   Medications:     Scheduled Medications:  Chlorhexidine Gluconate Cloth  6 each Topical Daily   digoxin  0.125 mg Oral Daily   insulin aspart  0-5 Units Subcutaneous QHS   insulin aspart  0-9 Units Subcutaneous TID WC   mouth rinse  15 mL Mouth Rinse BID   midodrine  5 mg Oral TID WC   potassium chloride  40 mEq Oral BID   sodium chloride flush  10-40 mL Intracatheter Q12H   spironolactone  12.5 mg Oral Daily   torsemide  40 mg Oral Daily    Infusions:  sodium chloride Stopped (02/08/21 1759)   sodium chloride Stopped (02/08/21 0434)   sodium chloride 10 mL/hr at 02/12/21 0700   amiodarone 30 mg/hr (02/12/21 0700)   heparin 1,650 Units/hr (02/12/21 0700)   milrinone 0.375 mcg/kg/min (02/12/21 0807)   norepinephrine (LEVOPHED) Adult infusion 4 mcg/min (02/09/21 2229)   potassium chloride 10 mEq (02/12/21 0821)    PRN Medications: sodium chloride, sodium chloride, lip balm, ondansetron (ZOFRAN) IV, sodium chloride flush    Assessment/Plan   Acute systolic HF -> Cardiogenic shock/acute biventricular heart failure/new cardiomyopathy -New diagnosis of CHF this admission after presenting with hemoptysis at OSH in [place]. Suspected viral illness with > 1 month hx weakness, fatigue, cough, N/V. HIV negative. Recently diagnosed with hypothyroidism.  Etiology => viral myocarditis versus familial cardiomyopathy (mother and aunts/uncles with CHF), CAD less likely but not ruled out.  - Echo here with EF of 10-15%, mildly dilated LV, moderate to severely reduced RV fxn with mildly dilated RV, RVSP 45 mmHg, severe secondary MR, mod-severe TR. - BNP 1,571. Pregnancy test negative.  - Currently on 0.375 mcg milrinone, off NE. Co-ox 69% but still tachy with mildly elevated lactate yesterday am.  - Creatinine up to 1.29, CVP down to 7-8 and weight down another 5 lbs.  Will stop IV Lasix today, start torsemide 40 mg daily this afternoon.   - Continue low dose midodrine 5 mg tid for  now, SBP 90s-100s.  - Continue dig and spiro.  - No beta blocker or Entresto due to shock. - Will need cardiac MRI once more stable. [facility] tomorrow, discussed with patient and she agrees to procedure.    2. Mitral valve regurgitation - Severe on echo this admission.  - Likely secondary, valve does not look abnormal on echo.   3. Moderate-severe TR - Likely secondary  4. NSVT - No further NSVT but has PVCs.  - Continue amio drip 30 mg per hour.  - Keep K> 4.0 Mg > 2.0 => needs aggressive replacement today and repeat BMET in pm.   5. Hemoptysis/community acquired pneumonia - Possible PE noted on imaging at OSH. Suspect course of events likely viral illness/myocarditis > inactivity, followed by PE and pneumonia. Venous duplex negative  for DVT. - CT chest here with consolidation both lower lobes. Ground glass opacities right middle lobe. - Was on ceftriaxone, now stopped by CCM.  - Suspect hemoptysis was due to elevated left atrial pressure.  - WBC 17.0. > 19.6> 19 > 17.9 - Hgb 9.4, no further hemoptysis. Now back on heparin for PE.   6. RLL PE - Seen on imaging at [facility] (CTA chest).  - Now back on heparin gtt.    6. Hypothyroidism -Recently diagnosed. -TSH 13.5/Free T4 1.39. -On synthroid PTA.   7. Elevated INR - Possibly due to passive congestion from CHF. - Given Vit K 6/29, 6/30, and 7/1 per CCM.  8. Hyponatremia - Resolved.  - Restrict free water  9. Acute blood loss anemia -Hgb 12.6 > 13.5 > 9.8> 9 > 10 > 9.4 -Presented with massive hemoptysis which is now improved.  Starting to improve.  Continue ambulation.   CRITICAL CARE Performed by: Marca Ancona  Total critical care time: 35 minutes  Critical care time was exclusive of separately billable procedures and treating other patients.  Critical care was necessary to treat or prevent imminent or life-threatening deterioration.  Critical care was time spent personally by me (independent of midlevel  providers or residents) on the following activities: development of treatment plan with patient and/or surrogate as well as nursing, discussions with consultants, evaluation of patient's response to treatment, examination of patient, obtaining history from patient or surrogate, ordering and performing treatments and interventions, ordering and review of laboratory studies, ordering and review of radiographic studies, pulse oximetry and re-evaluation of patient's condition.  Marca Ancona, MD  8:23 AM 02/12/2021

## 2021-02-12 NOTE — Progress Notes (Signed)
ANTICOAGULATION CONSULT NOTE Pharmacy Consult for heparin Indication: pulmonary embolus  Allergies  Allergen Reactions   Sulfa Antibiotics Hives   Ibuprofen Other (See Comments)    Constipation Can tolerate w milk of mag    Patient Measurements: Height: 5' 4.5" (163.8 cm) Weight: 98.5 kg (217 lb 2.5 oz) IBW/kg (Calculated) : 55.85 Heparin Dosing Weight: ~ 80 kg  Vital Signs: Temp: 98 F (36.7 C) (07/04 0323) Temp Source: Oral (07/04 0323) BP: 90/65 (07/04 1000) Pulse Rate: 110 (07/04 1000)  Labs: Recent Labs    02/10/21 1542 02/10/21 1744 02/11/21 0414 02/11/21 1200 02/11/21 1400 02/11/21 2159 02/12/21 0332 02/12/21 0800  HGB 9.9*  --  10.0*  --   --   --  9.4*  --   HCT 30.5*  --  29.6*  --   --   --  29.2*  --   PLT 178  --  189  --   --   --  227  --   HEPARINUNFRC >1.10*   < > <0.10* <0.10*  --  <0.10*  --  <0.10*  CREATININE  --   --  0.92  --  1.01*  --  1.29*  --    < > = values in this interval not displayed.     Estimated Creatinine Clearance: 71.4 mL/min (A) (by C-G formula based on SCr of 1.29 mg/dL (H)).  Assessment: 35 yo female with new PE for heparin. No AC PTA.  Heparin level remains undetectable, on 1650 units/hr. No s/sx of bleeding (just rusty colored sputum from previous hemoptysis) or infusion issues. Drawn appropriately (heparin running peripherally, drawn from central line). Discussed with RN.   Goal of Therapy:  Heparin level 0.2-0.4 Monitor platelets by anticoagulation protocol: Yes   Plan:  Increase heparin infusion to 1800 units/hr Check heparin level in 8 hours.  Daily heparin level and CBC. Perhaps could transition to Eliquis after cath procedure tomorrow.  Reece Leader, Colon Flattery, BCCP Clinical Pharmacist  02/12/2021 10:15 AM   [facility] pharmacy phone numbers are listed on [email or web address]

## 2021-02-12 NOTE — Progress Notes (Signed)
NAME:  Debra Daniel, MRN:  [id number], DOB:  January 22, 1986, LOS: 6 ADMISSION DATE:  02/06/2021, CONSULTATION DATE:  6/28 REFERRING MD:  Dr. Adela Glimpse, CHIEF COMPLAINT:  hemoptysis   History of Present Illness:  35 year old female with no significant past medical history up until quite recently. Three months prior to admission she was diagnosed with hypothyroid and was started on synthroid. Then, approximately 3 months prior to presentation she developed UTI symptoms. She is unclear what medications she was treated with, although she remembers the first one was ineffective and a second antibiotic was needed. Progressive lower extremity edema since that time as well. Then 6/25 she began to experience mild hemoptysis, which prompted her to present to [facility] 6/27. Workup in the ED included CTA chest and CT abdomen/pelvis. She was found to have right sided segmental PE as well a crazy paving pattern on the CT chest. CT abdomen was non-acute. She was admitted. Started on heparin for PE, which caused her hemoptysis to become much worse. Heparin was stopped. Echocardiogram done demonstrated LVEF 25-30% as well as mod-severe mitral regurgitation. She was transferred to [facility] for further evaluation.   Pertinent  Medical History   has a past medical history of Encounter for menstrual regulation (01/24/2015), Hypothyroid, Migraines, Obesity, and UTI (urinary tract infection).    has a past medical history of Encounter for menstrual regulation (01/24/2015), Hypothyroid, Migraines, Obesity, and UTI (urinary tract infection).   reports that she has never smoked. She has never used smokeless tobacco.   Significant Hospital Events: Including procedures, antibiotic start and stop dates in addition to other pertinent events   6/27 admit to [place] for hemotpysis. Dx PE. Worsening hemoptysis  6/28 tx to [facility] > then to ICU due to hypotension, hemoptysis.  6/29 milrinone, NE, UOP picking up,  PICC placed 6/30 hemoptysis improving but present, Cr better, feels better, on milrinone 7/1 going to add some txa nebs 7/2 no further hemoptysis and no SCVO2 better. Started on low dose heparin   Interim History / Subjective:  Dyspnea continues to improve. No further hemoptysis. Feels edema is improving.  Now on room air.  Objective   Blood pressure 107/72, pulse (!) 110, temperature 98.2 F (36.8 C), resp. rate (!) 24, height 5' 4.5" (1.638 m), weight 98.5 kg, last menstrual period 10/24/2020, SpO2 96 %. CVP:  [6 mmHg-12 mmHg] 6 mmHg      Intake/Output Summary (Last 24 hours) at 02/12/2021 1250 Last data filed at 02/12/2021 1200 Gross per 24 hour  Intake 1579.22 ml  Output 6300 ml  Net -4720.78 ml    Filed Weights   02/10/21 0431 02/11/21 0500 02/12/21 0500  Weight: 103.2 kg 101.1 kg 98.5 kg    Examination: General:  adult F sitting in chair NAD  HENT: NCAT pink mm. Scant dried blood on lips  Lungs: Even unlabored on RA. No adventitial sounds. Symmetrical chest expansion.  Cardiovascular: tachy. S1s2 cap refill brisk, Gallop LV heave.  Abdomen: soft round ndnt + bowel sounds  Extremities:  BLE edema - now mainly in feet.  No acute joint deformity no cyanosis or clubbing  Neuro: AAOx4 following commands no focal deficit   LABS    PULMONARY Recent Labs  Lab 02/09/21 1425 02/09/21 1600 02/10/21 0440 02/11/21 0805 02/12/21 0334  O2SAT 49.4 48.1 84.1 66.4 68.7     CBC Recent Labs  Lab 02/10/21 1542 02/11/21 0414 02/12/21 0332  HGB 9.9* 10.0* 9.4*  HCT 30.5* 29.6* 29.2*  WBC  17.4* 17.4* 17.9*  PLT 178 189 227     COAGULATION Recent Labs  Lab 02/06/21 2008 02/06/21 2018 02/07/21 1808 02/08/21 1017 02/09/21 1004  INR 1.9* 1.9* 1.8* 1.6* 1.6*     CARDIAC  No results for input(s): TROPONINI in the last 168 hours. No results for input(s): PROBNP in the last 168 hours.   CHEMISTRY Recent Labs  Lab 02/07/21 1808 02/08/21 0334 02/08/21 1732  02/09/21 0314 02/10/21 0440 02/11/21 0414 02/11/21 1400 02/12/21 0332  NA 132* 135   < > 130* 132* 136 135 135  K 3.1* 3.1*   < > 4.2 3.1* 3.2* 3.6 2.8*  CL 94* 101   < > 96* 96* 99 97* 94*  CO2 24 22   < > 22 27 28 28  32  GLUCOSE 319* 161*   < > 256* 190* 105* 155* 162*  BUN 18 17   < > 21* 20 15 14 15   CREATININE 1.05* 0.99   < > 1.27* 1.10* 0.92 1.01* 1.29*  CALCIUM 6.9* 6.7*   < > 7.5* 7.2* 7.1* 7.2* 7.3*  MG 1.5* 1.7  --  1.8  --  1.1*  --  1.6*   < > = values in this interval not displayed.    Estimated Creatinine Clearance: 71.4 mL/min (A) (by C-G formula based on SCr of 1.29 mg/dL (H)).   LIVER Recent Labs  Lab 02/06/21 2008 02/06/21 2018 02/07/21 1808 02/08/21 1017 02/09/21 0314 02/09/21 1004  AST 39  --   --   --  28  --   ALT 55*  --   --   --  35  --   ALKPHOS 61  --   --   --  48  --   BILITOT 3.5*  --   --   --  2.4*  --   PROT 5.2*  --   --   --  4.6*  --   ALBUMIN 2.4*  --   --   --  1.8*  --   INR 1.9* 1.9* 1.8* 1.6*  --  1.6*      INFECTIOUS Recent Labs  Lab 02/06/21 1919 02/06/21 2017 02/07/21 0226 02/07/21 0542 02/08/21 0334 02/08/21 0623 02/10/21 0440 02/11/21 0800 02/11/21 1055  LATICACIDVEN  --    < >  --    < >  --    < > 1.6 2.1* 2.3*  PROCALCITON 2.31  --  2.54  --  1.68  --   --   --   --    < > = values in this interval not displayed.      ENDOCRINE CBG (last 3)  Recent Labs    02/11/21 2158 02/12/21 0653 02/12/21 1126  GLUCAP 122* 90 108*     IMAGING x48h  - image(s) personally visualized  -   highlighted in bold 04/15/21 EKG SITE RITE  Result Date: 02/11/2021 If Site Rite image not attached, placement could not be confirmed due to current cardiac rhythm.    Resolved Hospital Problem list   AGMA  Assessment & Plan:   Acute exacerbation of newly diagnosis biventricular heart failure with cardiogenic shock MR, TR Acute hypoxic respiratory failure  CAP   Hemoptysis  Reported Pulmonary Embolism per OSH report   AKI  Improved with diuresis, inotrope. Hyponatremia, mild Elevated LFTs -- congestive from biV failure  Elevated INRsuspect related to congestive hepatopathy  Hypothyroidism Microscopic hematuria  Plan:  - Appears near euvolemic with CVP of 6- switched  to torsemide.  - Continue milrinone at current dose.  - Amiodarone and digoxin for AF. -Continue low-dose heparin.  Suspect hemoptysis is from high left-sided cardiac pressures. - completed antibiotics.  - RHC/LHC tomorrow.   Best Practice (right click and "Reselect all SmartList Selections" daily)   Diet/type: Regular consistency (see orders) DVT prophylaxis: other - TED stocking and systemic heparin. GI prophylaxis: N/A Lines: Central line and yes and it is still needed Foley:  N/A Code Status:  full code Last date of multidisciplinary goals of care discussion [ 6/28]  CRITICAL CARE Performed by: Lynnell Catalan   Total critical care time:  Critical care time was exclusive of separately billable procedures and treating other patients.  Critical care was necessary to treat or prevent imminent or life-threatening deterioration.  Critical care was time spent personally by me on the following activities: development of treatment plan with patient and/or surrogate as well as nursing, discussions with consultants, evaluation of patient's response to treatment, examination of patient, obtaining history from patient or surrogate, ordering and performing treatments and interventions, ordering and review of laboratory studies, ordering and review of radiographic studies, pulse oximetry and re-evaluation of patient's condition.  Lynnell Catalan, MD [facility] ICU Physician [facility]  Pager: [phone] Or Epic Secure Chat After hours: [phone].  02/12/2021, 12:50 PM

## 2021-02-12 NOTE — Progress Notes (Signed)
eLink Physician-Brief Progress Note Patient Name: Debra Daniel DOB: October 29, 1985 MRN: [id number]   Date of Service  02/12/2021  HPI/Events of Note  Hypokalemia  Hypomagnesemia - K+ = 2.6, Mg++ = 1.6 and Creatinine = 1.29.  eICU Interventions  Will replace Mg++ and K+.     Intervention Category Major Interventions: Electrolyte abnormality - evaluation and management  Evonne Rinks [place] 02/12/2021, 4:54 AM

## 2021-02-12 NOTE — Progress Notes (Signed)
ANTICOAGULATION CONSULT NOTE Pharmacy Consult for heparin Indication: pulmonary embolus  Allergies  Allergen Reactions   Sulfa Antibiotics Hives   Ibuprofen Other (See Comments)    Constipation Can tolerate w milk of mag    Patient Measurements: Height: 5' 4.5" (163.8 cm) Weight: 98.5 kg (217 lb 2.5 oz) IBW/kg (Calculated) : 55.85 Heparin Dosing Weight: ~ 80 kg  Vital Signs: Temp: 98.2 F (36.8 C) (07/04 1116) BP: 99/72 (07/04 1800) Pulse Rate: 112 (07/04 1800)  Labs: Recent Labs    02/10/21 1542 02/10/21 1744 02/11/21 0414 02/11/21 1200 02/11/21 1400 02/11/21 2159 02/12/21 0332 02/12/21 0800 02/12/21 1330 02/12/21 1753  HGB 9.9*  --  10.0*  --   --   --  9.4*  --   --   --   HCT 30.5*  --  29.6*  --   --   --  29.2*  --   --   --   PLT 178  --  189  --   --   --  227  --   --   --   HEPARINUNFRC >1.10*   < > <0.10*   < >  --  <0.10*  --  <0.10*  --  <0.10*  CREATININE  --   --  0.92  --  1.01*  --  1.29*  --  1.06*  --    < > = values in this interval not displayed.     Estimated Creatinine Clearance: 86.9 mL/min (A) (by C-G formula based on SCr of 1.06 mg/dL (H)).  Assessment: 35 yo female with new PE for heparin. No AC PTA.  Heparin level remains undetectable, on 1800 units/hr. Coughing up a little more blood clots - will monitor. No infusion issues. Drawn appropriately (heparin running peripherally, drawn from central line).   Goal of Therapy:  Heparin level 0.2-0.4 Monitor platelets by anticoagulation protocol: Yes   Plan:  Increase heparin infusion to 1950 units/hr Check heparin level in 8 hours.  Daily heparin level and CBC. Perhaps could transition to Eliquis after cath procedure tomorrow.  Sherron Monday, PharmD, BCCCP Clinical Pharmacist  Phone: [phone] 02/12/2021 6:57 PM  Please check AMION for all [facility] phone numbers After 10:00 PM, call Main Pharmacy [phone]

## 2021-02-13 ENCOUNTER — Encounter (HOSPITAL_COMMUNITY)
Admission: AD | Disposition: A | Discharge status: Home / Self Care | Payer: Self-pay | Source: Other Acute Inpatient Hospital | Attending: Internal Medicine

## 2021-02-13 ENCOUNTER — Inpatient Hospital Stay (HOSPITAL_COMMUNITY): Payer: Medicaid Other

## 2021-02-13 HISTORY — PX: RIGHT/LEFT HEART CATH AND CORONARY ANGIOGRAPHY: CATH118266

## 2021-02-13 LAB — CBC
HCT: 30.3 % — ABNORMAL LOW (ref 36.0–46.0)
HCT: 32 % — ABNORMAL LOW (ref 36.0–46.0)
Hemoglobin: 9.6 g/dL — ABNORMAL LOW (ref 12.0–15.0)
Hemoglobin: 9.9 g/dL — ABNORMAL LOW (ref 12.0–15.0)
MCH: 26.1 pg (ref 26.0–34.0)
MCH: 25.6 pg — ABNORMAL LOW (ref 26.0–34.0)
MCHC: 31.7 g/dL (ref 30.0–36.0)
MCHC: 30.9 g/dL (ref 30.0–36.0)
MCV: 82.3 fL (ref 80.0–100.0)
MCV: 82.7 fL (ref 80.0–100.0)
Platelets: 143 10*3/uL — ABNORMAL LOW (ref 150–400)
Platelets: DECREASED 10*3/uL (ref 150–400)
RBC: 3.68 MIL/uL — ABNORMAL LOW (ref 3.87–5.11)
RBC: 3.87 MIL/uL (ref 3.87–5.11)
RDW: 17.3 % — ABNORMAL HIGH (ref 11.5–15.5)
RDW: 17.6 % — ABNORMAL HIGH (ref 11.5–15.5)
WBC: 17.3 10*3/uL — ABNORMAL HIGH (ref 4.0–10.5)
WBC: 17.6 10*3/uL — ABNORMAL HIGH (ref 4.0–10.5)
nRBC: 0.1 % (ref 0.0–0.2)
nRBC: 0.1 % (ref 0.0–0.2)

## 2021-02-13 LAB — POCT I-STAT EG7
Acid-Base Excess: 7 mmol/L — ABNORMAL HIGH (ref 0.0–2.0)
Acid-Base Excess: 9 mmol/L — ABNORMAL HIGH (ref 0.0–2.0)
Acid-Base Excess: 5 mmol/L — ABNORMAL HIGH (ref 0.0–2.0)
Bicarbonate: 30.7 mmol/L — ABNORMAL HIGH (ref 20.0–28.0)
Bicarbonate: 32.6 mmol/L — ABNORMAL HIGH (ref 20.0–28.0)
Bicarbonate: 28.3 mmol/L — ABNORMAL HIGH (ref 20.0–28.0)
Calcium, Ion: 1.03 mmol/L — ABNORMAL LOW (ref 1.15–1.40)
Calcium, Ion: 1.11 mmol/L — ABNORMAL LOW (ref 1.15–1.40)
Calcium, Ion: 1.08 mmol/L — ABNORMAL LOW (ref 1.15–1.40)
HCT: 30 % — ABNORMAL LOW (ref 36.0–46.0)
HCT: 30 % — ABNORMAL LOW (ref 36.0–46.0)
HCT: 35 % — ABNORMAL LOW (ref 36.0–46.0)
Hemoglobin: 10.2 g/dL — ABNORMAL LOW (ref 12.0–15.0)
Hemoglobin: 10.2 g/dL — ABNORMAL LOW (ref 12.0–15.0)
Hemoglobin: 11.9 g/dL — ABNORMAL LOW (ref 12.0–15.0)
O2 Saturation: 56 %
O2 Saturation: 58 %
O2 Saturation: 59 %
Patient temperature: 36.6
Potassium: 4.2 mmol/L (ref 3.5–5.1)
Potassium: 4.4 mmol/L (ref 3.5–5.1)
Potassium: 4.1 mmol/L (ref 3.5–5.1)
Sodium: 135 mmol/L (ref 135–145)
Sodium: 137 mmol/L (ref 135–145)
Sodium: 136 mmol/L (ref 135–145)
TCO2: 32 mmol/L (ref 22–32)
TCO2: 34 mmol/L — ABNORMAL HIGH (ref 22–32)
TCO2: 29 mmol/L (ref 22–32)
pCO2, Ven: 38.9 mmHg — ABNORMAL LOW (ref 44.0–60.0)
pCO2, Ven: 40.3 mmHg — ABNORMAL LOW (ref 44.0–60.0)
pCO2, Ven: 35.5 mmHg — ABNORMAL LOW (ref 44.0–60.0)
pH, Ven: 7.505 — ABNORMAL HIGH (ref 7.250–7.430)
pH, Ven: 7.516 — ABNORMAL HIGH (ref 7.250–7.430)
pH, Ven: 7.508 — ABNORMAL HIGH (ref 7.250–7.430)
pO2, Ven: 26 mmHg — CL (ref 32.0–45.0)
pO2, Ven: 28 mmHg — CL (ref 32.0–45.0)
pO2, Ven: 27 mmHg — CL (ref 32.0–45.0)

## 2021-02-13 LAB — HEPARIN LEVEL (UNFRACTIONATED)
Heparin Unfractionated: 0.11 IU/mL — ABNORMAL LOW (ref 0.30–0.70)
Heparin Unfractionated: 0.22 IU/mL — ABNORMAL LOW (ref 0.30–0.70)

## 2021-02-13 LAB — COMPREHENSIVE METABOLIC PANEL
ALT: 41 U/L (ref 0–44)
AST: 58 U/L — ABNORMAL HIGH (ref 15–41)
Albumin: 1.9 g/dL — ABNORMAL LOW (ref 3.5–5.0)
Alkaline Phosphatase: 64 U/L (ref 38–126)
Anion gap: 13 (ref 5–15)
BUN: 18 mg/dL (ref 6–20)
CO2: 24 mmol/L (ref 22–32)
Calcium: 8.1 mg/dL — ABNORMAL LOW (ref 8.9–10.3)
Chloride: 97 mmol/L — ABNORMAL LOW (ref 98–111)
Creatinine, Ser: 1.17 mg/dL — ABNORMAL HIGH (ref 0.44–1.00)
GFR, Estimated: >60 mL/min (ref >60)
Glucose, Bld: 150 mg/dL — ABNORMAL HIGH (ref 70–99)
Potassium: 4.7 mmol/L (ref 3.5–5.1)
Sodium: 134 mmol/L — ABNORMAL LOW (ref 135–145)
Total Bilirubin: 2 mg/dL — ABNORMAL HIGH (ref 0.3–1.2)
Total Protein: 5.4 g/dL — ABNORMAL LOW (ref 6.5–8.1)

## 2021-02-13 LAB — COOXEMETRY PANEL
Carboxyhemoglobin: 1.5 % (ref 0.5–1.5)
Carboxyhemoglobin: 1.3 % (ref 0.5–1.5)
Methemoglobin: 0.7 % (ref 0.0–1.5)
Methemoglobin: 0.7 % (ref 0.0–1.5)
O2 Saturation: 58.3 %
O2 Saturation: 51.3 %
Total hemoglobin: 9.2 g/dL — ABNORMAL LOW (ref 12.0–16.0)
Total hemoglobin: 9.7 g/dL — ABNORMAL LOW (ref 12.0–16.0)

## 2021-02-13 LAB — LACTIC ACID, PLASMA: Lactic Acid, Venous: 2.7 mmol/L (ref 0.5–1.9)

## 2021-02-13 LAB — SARS CORONAVIRUS 2 BY RT PCR (HOSPITAL ORDER, PERFORMED IN ~~LOC~~ HOSPITAL LAB): SARS Coronavirus 2: NEGATIVE

## 2021-02-13 LAB — MAGNESIUM
Magnesium: 2.2 mg/dL (ref 1.7–2.4)
Magnesium: 2.2 mg/dL (ref 1.7–2.4)

## 2021-02-13 LAB — BASIC METABOLIC PANEL
Anion gap: 7 (ref 5–15)
BUN: 17 mg/dL (ref 6–20)
CO2: 31 mmol/L (ref 22–32)
Calcium: 7.8 mg/dL — ABNORMAL LOW (ref 8.9–10.3)
Chloride: 97 mmol/L — ABNORMAL LOW (ref 98–111)
Creatinine, Ser: 0.91 mg/dL (ref 0.44–1.00)
GFR, Estimated: >60 mL/min (ref >60)
Glucose, Bld: 103 mg/dL — ABNORMAL HIGH (ref 70–99)
Potassium: 4.2 mmol/L (ref 3.5–5.1)
Sodium: 135 mmol/L (ref 135–145)

## 2021-02-13 LAB — GLUCOSE, CAPILLARY: Glucose-Capillary: 146 mg/dL — ABNORMAL HIGH (ref 70–99)

## 2021-02-13 LAB — TROPONIN I (HIGH SENSITIVITY): Troponin I (High Sensitivity): 38 ng/L — ABNORMAL HIGH (ref <18)

## 2021-02-13 SURGERY — RIGHT/LEFT HEART CATH AND CORONARY ANGIOGRAPHY
Anesthesia: LOCAL

## 2021-02-13 MED ORDER — SODIUM CHLORIDE 0.9% FLUSH
3.0000 mL | INTRAVENOUS | Status: DC | PRN
  Administered 2021-02-16: 3 mL via INTRAVENOUS

## 2021-02-13 MED ORDER — IVABRADINE HCL 5 MG PO TABS
5.0000 mg | ORAL_TABLET | Freq: Two times a day (BID) | ORAL | Status: DC
  Administered 2021-02-13 – 2021-02-14 (×3): 5 mg via ORAL
  Filled 2021-02-13 (×4): qty 1

## 2021-02-13 MED ORDER — MIDAZOLAM HCL 2 MG/2ML IJ SOLN
INTRAMUSCULAR | Status: DC | PRN
  Administered 2021-02-13: 1 mg via INTRAVENOUS

## 2021-02-13 MED ORDER — VERAPAMIL HCL 2.5 MG/ML IV SOLN
INTRAVENOUS | Status: AC
  Filled 2021-02-13: qty 2

## 2021-02-13 MED ORDER — HEPARIN SODIUM (PORCINE) 1000 UNIT/ML IJ SOLN
INTRAMUSCULAR | Status: AC
  Filled 2021-02-13: qty 1

## 2021-02-13 MED ORDER — SODIUM CHLORIDE 0.9 % IV SOLN: 250.0000 mL | INTRAVENOUS | Status: DC | PRN

## 2021-02-13 MED ORDER — VERAPAMIL HCL 2.5 MG/ML IV SOLN
INTRAVENOUS | Status: DC | PRN
  Administered 2021-02-13: 10 mL via INTRA_ARTERIAL

## 2021-02-13 MED ORDER — SODIUM CHLORIDE 0.9% FLUSH
3.0000 mL | Freq: Two times a day (BID) | INTRAVENOUS | Status: DC
  Administered 2021-02-14 – 2021-02-22 (×14): 3 mL via INTRAVENOUS

## 2021-02-13 MED ORDER — HEPARIN SODIUM (PORCINE) 1000 UNIT/ML IJ SOLN
INTRAMUSCULAR | Status: DC | PRN
  Administered 2021-02-13: 5000 [IU] via INTRAVENOUS

## 2021-02-13 MED ORDER — HEPARIN (PORCINE) IN NACL 1000-0.9 UT/500ML-% IV SOLN
INTRAVENOUS | Status: DC | PRN
  Administered 2021-02-13 (×2): 500 mL

## 2021-02-13 MED ORDER — ONDANSETRON HCL 4 MG/2ML IJ SOLN: 4.0000 mg | Freq: Four times a day (QID) | INTRAMUSCULAR | Status: DC | PRN

## 2021-02-13 MED ORDER — HEPARIN (PORCINE) IN NACL 1000-0.9 UT/500ML-% IV SOLN
INTRAVENOUS | Status: AC
  Filled 2021-02-13: qty 500

## 2021-02-13 MED ORDER — LEVOTHYROXINE SODIUM 75 MCG PO TABS
75.0000 ug | ORAL_TABLET | Freq: Every day | ORAL | Status: DC
  Administered 2021-02-13 – 2021-02-22 (×10): 75 ug via ORAL
  Filled 2021-02-13 (×10): qty 1

## 2021-02-13 MED ORDER — MIDAZOLAM HCL 2 MG/2ML IJ SOLN
INTRAMUSCULAR | Status: AC
  Filled 2021-02-13: qty 2

## 2021-02-13 MED ORDER — LIDOCAINE HCL (PF) 1 % IJ SOLN
INTRAMUSCULAR | Status: DC | PRN
  Administered 2021-02-13 (×3): 2 mL

## 2021-02-13 MED ORDER — TORSEMIDE 20 MG PO TABS
40.0000 mg | ORAL_TABLET | Freq: Two times a day (BID) | ORAL | Status: DC
  Administered 2021-02-13: 40 mg via ORAL
  Filled 2021-02-13 (×2): qty 2

## 2021-02-13 MED ORDER — ACETAMINOPHEN 325 MG PO TABS
650.0000 mg | ORAL_TABLET | ORAL | Status: DC | PRN
  Administered 2021-02-15 – 2021-02-21 (×8): 650 mg via ORAL
  Filled 2021-02-13 (×8): qty 2

## 2021-02-13 MED ORDER — LIDOCAINE HCL (PF) 1 % IJ SOLN
INTRAMUSCULAR | Status: AC
  Filled 2021-02-13: qty 30

## 2021-02-13 MED ORDER — HEPARIN (PORCINE) 25000 UT/250ML-% IV SOLN
2100.0000 [IU]/h | INTRAVENOUS | Status: DC
  Administered 2021-02-14: 2100 [IU]/h via INTRAVENOUS
  Filled 2021-02-13 (×2): qty 250

## 2021-02-13 MED ORDER — CALCIUM GLUCONATE-NACL 2-0.675 GM/100ML-% IV SOLN
2.0000 g | Freq: Once | INTRAVENOUS | Status: AC
Start: 2021-02-14 — End: 2021-02-14
  Administered 2021-02-14: 2000 mg via INTRAVENOUS
  Filled 2021-02-13: qty 100

## 2021-02-13 MED ORDER — FENTANYL CITRATE (PF) 100 MCG/2ML IJ SOLN
INTRAMUSCULAR | Status: AC
  Filled 2021-02-13: qty 2

## 2021-02-13 MED ORDER — HYDRALAZINE HCL 20 MG/ML IJ SOLN: 10.0000 mg | INTRAMUSCULAR | Status: AC | PRN

## 2021-02-13 MED ORDER — MAGNESIUM SULFATE 4 GM/100ML IV SOLN
4.0000 g | Freq: Once | INTRAVENOUS | Status: AC
  Administered 2021-02-13: 4 g via INTRAVENOUS
  Filled 2021-02-13: qty 100

## 2021-02-13 MED ORDER — LABETALOL HCL 5 MG/ML IV SOLN: 10.0000 mg | INTRAVENOUS | Status: AC | PRN

## 2021-02-13 MED ORDER — IOHEXOL 350 MG/ML SOLN
INTRAVENOUS | Status: DC | PRN
  Administered 2021-02-13: 30 mL

## 2021-02-13 MED ORDER — FENTANYL CITRATE (PF) 100 MCG/2ML IJ SOLN
INTRAMUSCULAR | Status: DC | PRN
  Administered 2021-02-13: 25 ug via INTRAVENOUS

## 2021-02-13 SURGICAL SUPPLY — 12 items
CATH 5FR JL3.5 JR4 ANG PIG MP (CATHETERS) ×2 IMPLANT
CATH BALLN WEDGE 5F 110CM (CATHETERS) ×2 IMPLANT
DEVICE RAD COMP TR BAND LRG (VASCULAR PRODUCTS) ×2 IMPLANT
ELECT DEFIB PAD ADLT CADENCE (PAD) ×2 IMPLANT
GLIDESHEATH SLEND SS 6F .021 (SHEATH) ×2 IMPLANT
KIT HEART LEFT (KITS) ×2 IMPLANT
PACK CARDIAC CATHETERIZATION (CUSTOM PROCEDURE TRAY) ×2 IMPLANT
SHEATH GLIDE SLENDER 4/5FR (SHEATH) ×2 IMPLANT
TRANSDUCER W/STOPCOCK (MISCELLANEOUS) ×2 IMPLANT
TUBING CIL FLEX 10 FLL-RA (TUBING) ×2 IMPLANT
WIRE HI TORQ VERSACORE J 260CM (WIRE) ×2 IMPLANT
WIRE ROSEN-J .035X260CM (WIRE) ×2 IMPLANT

## 2021-02-13 NOTE — Progress Notes (Addendum)
ANTICOAGULATION CONSULT NOTE  Pharmacy Consult for IV Heparin Indication: pulmonary embolus  Allergies  Allergen Reactions   Sulfa Antibiotics Hives   Ibuprofen Other (See Comments)    Constipation Can tolerate w milk of mag    Patient Measurements: Height: 5' 4.5" (163.8 cm) Weight: 97.3 kg (214 lb 8.1 oz) IBW/kg (Calculated) : 55.85 Heparin Dosing Weight: 68 kg  Vital Signs: Temp: 98.2 F (36.8 C) (07/05 1612) Temp Source: Oral (07/05 0807) BP: 100/66 (07/05 1600) Pulse Rate: 89 (07/05 1600)  Labs: Recent Labs    02/11/21 0414 02/11/21 1200 02/12/21 0332 02/12/21 0800 02/12/21 1330 02/12/21 1753 02/13/21 0400 02/13/21 0455 02/13/21 1351  HGB 10.0*  --  9.4*  --   --   --   --  9.6*  --   HCT 29.6*  --  29.2*  --   --   --   --  30.3*  --   PLT 189  --  227  --   --   --   --  143*  --   HEPARINUNFRC <0.10*   < >  --    < >  --  <0.10* 0.11*  --  0.22*  CREATININE 0.92   < > 1.29*  --  1.06*  --   --  0.91  --    < > = values in this interval not displayed.    Estimated Creatinine Clearance: 100.6 mL/min (by C-G formula based on SCr of 0.91 mg/dL).  Assessment: 35 yr old female with new PE. Pharmacy was consulted to dose IV heparin; pt was not on anticoagulant PTA. Pt had hemoptysis earlier this admission, suspected to be due to elevated left atrial pressure. Pt is S/P cardiac cath this afternoon, which showed pulmonary venous hypertension, mildly-elevated filling pressures, cardiac output OK on milrinone 0.25, no significant CAD. Pharmacy is consulted to resume heparin infusion ~8 hrs after sheath removal (per cath lab procedure log, sheath was removed ~1745 PM this afternoon).  Earlier today, pt had therapeutic heparin level at 0.22 units/ml on heparin infusion at 2100 units/hr (using lower goal), with no further hemoptysis noted. Per cath lab RN, no bleeding issues noted post cath.  Goal of Therapy:  Heparin level 0.2-0.4 units/ml Monitor platelets by  anticoagulation protocol: Yes   Plan:  Resume heparin infusion at 2100 units/hr 8 hrs after sheath removal (resume at 0145 AM tomorrow, 7/6) Check heparin level 6 hrs after restarting heparin infusion Monitor daily heparin level, CBC Monitor for bleeding F/U transition to oral anticoagulant when able  Vicki Mallet, PharmD, BCPS, [facility] Clinical Pharmacist 02/13/2021

## 2021-02-13 NOTE — H&P (View-Only) (Signed)
Patient ID: Debra Daniel, female   DOB: March 15, 1986, 35 y.o.   MRN: [id number]     Advanced Heart Failure Rounding Note  PCP-Cardiologist: None   Subjective:   -05/13: Seen in ED for recurrent nausea and vomiting. Treated for possible UTI. Ongoing weakness, fatigue, intermittent N/V, cough since. -06/27: ED at OSH with hemoptysis. Found to have segmental PE and pneumonia. EF 25-30%. -06/28: Transferred to [facility]. Cardiogenic shock > milrinone 0.25 mcg. Diuresed with IV lasix. -06/29: Tachycardic. MAT noted on tele. Added digoxin and spiro. Amio added d/t NSVT. Last dose IV lasix. Norepi off. - 6/30: midodrine started and lasix drip started.   Off norepinephrine, remains on milrinone 0.375 mcg. Still tachycardic with HR 100s-110s.  Co-ox 58% this morning.   Off IV Lasix, weight down 3 lbs. CVP 9. Creatinine 0.91.   Hemoptysis resolved, back on heparin gtt with hgb 9.6. She is now off antibiotics. Platelets dropped this morning, but looking at trend for her stay, she has been up and down considerably.   Has RLL PE seen at [facility] on CTA chest.    Walked around unit this morning already.   Echo 02/06/2021: LVEF 10-15%, LV moderately dilated, RV systolic function severely reduced, RV moderately enlarged, RVSP 45.9 mmHg, severe secondary MR, mod-severe TR (personally reviewed).  Objective:   Weight Range: 97.3 kg Body mass index is 36.25 kg/m.   Vital Signs:   Temp:  [98.2 F (36.8 C)-98.6 F (37 C)] 98.3 F (36.8 C) (07/05 0348) Pulse Rate:  [106-117] 108 (07/05 0600) Resp:  [16-37] 19 (07/05 0600) BP: (90-109)/(64-77) 97/71 (07/05 0600) SpO2:  [92 %-100 %] 92 % (07/05 0600) Weight:  [97.3 kg] 97.3 kg (07/05 0500) Last BM Date: 02/12/21  Weight change: Filed Weights   02/11/21 0500 02/12/21 0500 02/13/21 0500  Weight: 101.1 kg 98.5 kg 97.3 kg    Intake/Output:   Intake/Output Summary (Last 24 hours) at 02/13/2021 0751 Last data filed at 02/13/2021 0630 Gross per 24 hour   Intake 1427.96 ml  Output 2000 ml  Net -572.04 ml      Physical Exam   CVP 9 General: NAD Neck: No JVD, no thyromegaly or thyroid nodule.  Lungs: Clear to auscultation bilaterally with normal respiratory effort. CV: Nondisplaced PMI.  Heart regular S1/S2, no S3/S4, 1/6 HSM apex.  1+ ankle edema.  Abdomen: Soft, nontender, no hepatosplenomegaly, no distention.  Skin: Intact without lesions or rashes.  Neurologic: Alert and oriented x 3.  Psych: Normal affect. Extremities: No clubbing or cyanosis.  HEENT: Normal.    Telemetry  ST 100s-110s (personally reviewed)  Labs    CBC Recent Labs    02/12/21 0332 02/13/21 0455  WBC 17.9* 17.3*  HGB 9.4* 9.6*  HCT 29.2* 30.3*  MCV 80.2 82.3  PLT 227 143*   Basic Metabolic Panel Recent Labs    63/87/56 0332 02/12/21 1330 02/13/21 0455  NA 135 129* 135  K 2.8* 3.0* 4.2  CL 94* 88* 97*  CO2 32 30 31  GLUCOSE 162* 193* 103*  BUN 15 16 17   CREATININE 1.29* 1.06* 0.91  CALCIUM 7.3* 7.3* 7.8*  MG 1.6*  --  2.2   Liver Function Tests No results for input(s): AST, ALT, ALKPHOS, BILITOT, PROT, ALBUMIN in the last 72 hours.  No results for input(s): LIPASE, AMYLASE in the last 72 hours. Cardiac Enzymes No results for input(s): CKTOTAL, CKMB, CKMBINDEX, TROPONINI in the last 72 hours.  BNP: BNP (last 3 results) Recent Labs  02/06/21 2018  BNP 1,571.8*    ProBNP (last 3 results) No results for input(s): PROBNP in the last 8760 hours.   D-Dimer No results for input(s): DDIMER in the last 72 hours.  Hemoglobin A1C No results for input(s): HGBA1C in the last 72 hours.  Fasting Lipid Panel No results for input(s): CHOL, HDL, LDLCALC, TRIG, CHOLHDL, LDLDIRECT in the last 72 hours. Thyroid Function Tests No results for input(s): TSH, T4TOTAL, T3FREE, THYROIDAB in the last 72 hours.  Invalid input(s): FREET3   Other results:   Imaging    No results found.   Medications:     Scheduled Medications:   Chlorhexidine Gluconate Cloth  6 each Topical Daily   digoxin  0.125 mg Oral Daily   insulin aspart  0-5 Units Subcutaneous QHS   insulin aspart  0-9 Units Subcutaneous TID WC   mouth rinse  15 mL Mouth Rinse BID   midodrine  5 mg Oral TID WC   sodium chloride flush  10-40 mL Intracatheter Q12H   sodium chloride flush  3 mL Intravenous Q12H   spironolactone  12.5 mg Oral Daily   torsemide  40 mg Oral Daily    Infusions:  sodium chloride Stopped (02/08/21 1759)   sodium chloride Stopped (02/08/21 0434)   sodium chloride 10 mL/hr at 02/13/21 0600   sodium chloride     sodium chloride     amiodarone 30 mg/hr (02/13/21 0732)   heparin 2,100 Units/hr (02/13/21 0629)   milrinone 0.375 mcg/kg/min (02/13/21 0600)   norepinephrine (LEVOPHED) Adult infusion 4 mcg/min (02/09/21 2229)    PRN Medications: sodium chloride, sodium chloride, sodium chloride, lip balm, ondansetron (ZOFRAN) IV, sodium chloride flush, sodium chloride flush    Assessment/Plan   Acute systolic HF -> Cardiogenic shock/acute biventricular heart failure/new cardiomyopathy -New diagnosis of CHF this admission after presenting with hemoptysis at OSH in [place]. Suspected viral illness with > 1 month hx weakness, fatigue, cough, N/V. HIV negative. Recently diagnosed with hypothyroidism.  Etiology => viral myocarditis versus familial cardiomyopathy (mother and aunts/uncles with CHF), CAD less likely but not ruled out.  - Echo here with EF of 10-15%, mildly dilated LV, moderate to severely reduced RV fxn with mildly dilated RV, RVSP 45 mmHg, severe secondary MR, mod-severe TR. - BNP 1,571. Pregnancy test negative.  - Currently on 0.375 mcg milrinone, off NE.  Co-ox in early am was 58%.  I am going to try to ease down her milrinone this morning to 0.25, will have formal RHC this afternoon to assess cardiac output.   - CVP 9, continue torsemide 40 mg daily.   - Continue low dose midodrine 5 mg tid for now, SBP 90s-100s.  -  Continue dig and spiro.  - Will add ivabradine 5 mg bid.  - No beta blocker or Entresto due to shock. - Will need cardiac MRI once more stable. - R/LHC this afternoon, discussed with patient and she agrees to procedure.    2. Mitral valve regurgitation - Severe on echo this admission.  - Likely secondary, valve does not look abnormal on echo.   3. Moderate-severe TR - Likely secondary  4. NSVT - No further NSVT but has PVCs.  - Continue amio drip 30 mg per hour.  - Keep K> 4.0 Mg > 2.0 => needs aggressive replacement today and repeat BMET in pm.   5. Hemoptysis/community acquired pneumonia - Possible PE noted on imaging at OSH. Suspect course of events likely viral illness/myocarditis > inactivity, followed by PE  and pneumonia. Venous duplex negative for DVT. - CT chest here with consolidation both lower lobes. Ground glass opacities right middle lobe. - Was on ceftriaxone, now stopped by CCM.  - Suspect hemoptysis was due to elevated left atrial pressure.  - WBC 17.0. > 19.6> 19 > 17.9 > 17.3 - Hgb 9.6 (higher), no further hemoptysis. Now back on heparin for PE.   6. RLL PE - Seen on imaging at [facility] (CTA chest).  - Now back on heparin gtt.    6. Hypothyroidism -Recently diagnosed. -TSH 13.5/Free T4 1.39. -On synthroid PTA.   7. Elevated INR - Possibly due to passive congestion from CHF. - Given Vit K 6/29, 6/30, and 7/1 per CCM.  8. Hyponatremia - Resolved.  - Restrict free water  9. Acute blood loss anemia -Hgb 12.6 > 13.5 > 9.8> 9 > 10 > 9.4 > 9.6 -Presented with massive hemoptysis which is now improved.  10. Thrombocytopenia - Mild but significant drop.  However, looking at trend, platelets have had significantly fluctuation and have been in the 140s before (prior to starting heparin).  Hopefully this is not HIT but will follow platelet trend closely.   Starting to improve.  Continue ambulation.   CRITICAL CARE Performed by: Marca Ancona  Total critical  care time: 35 minutes  Critical care time was exclusive of separately billable procedures and treating other patients.  Critical care was necessary to treat or prevent imminent or life-threatening deterioration.  Critical care was time spent personally by me (independent of midlevel providers or residents) on the following activities: development of treatment plan with patient and/or surrogate as well as nursing, discussions with consultants, evaluation of patient's response to treatment, examination of patient, obtaining history from patient or surrogate, ordering and performing treatments and interventions, ordering and review of laboratory studies, ordering and review of radiographic studies, pulse oximetry and re-evaluation of patient's condition.  Marca Ancona, MD  7:51 AM 02/13/2021

## 2021-02-13 NOTE — Progress Notes (Signed)
ANTICOAGULATION CONSULT NOTE  Pharmacy Consult for heparin Indication: pulmonary embolus  Allergies  Allergen Reactions   Sulfa Antibiotics Hives   Ibuprofen Other (See Comments)    Constipation Can tolerate w milk of mag    Patient Measurements: Height: 5' 4.5" (163.8 cm) Weight: 97.3 kg (214 lb 8.1 oz) IBW/kg (Calculated) : 55.85 Heparin Dosing Weight: ~ 80 kg  Vital Signs: Temp: 97.9 F (36.6 C) (07/05 1100) Temp Source: Oral (07/05 0807) BP: 97/61 (07/05 1200) Pulse Rate: 101 (07/05 1400)  Labs: Recent Labs    02/11/21 0414 02/11/21 1200 02/12/21 0332 02/12/21 0800 02/12/21 1330 02/12/21 1753 02/13/21 0400 02/13/21 0455 02/13/21 1351  HGB 10.0*  --  9.4*  --   --   --   --  9.6*  --   HCT 29.6*  --  29.2*  --   --   --   --  30.3*  --   PLT 189  --  227  --   --   --   --  143*  --   HEPARINUNFRC <0.10*   < >  --    < >  --  <0.10* 0.11*  --  0.22*  CREATININE 0.92   < > 1.29*  --  1.06*  --   --  0.91  --    < > = values in this interval not displayed.     Estimated Creatinine Clearance: 100.6 mL/min (by C-G formula based on SCr of 0.91 mg/dL).  Assessment: 35 yo female with new PE for heparin. No AC PTA. Pt had significant hemoptysis initially so heparin level goal is lower.  Heparin level therapeutic at 0.22, no reported hemoptysis.  Goal of Therapy:  Heparin level 0.2-0.4 Monitor platelets by anticoagulation protocol: Yes   Plan:  Continue heparin 2100 units/h Daily heparin level and CBC   Fredonia Highland, PharmD, [place], [facility] Clinical Pharmacist [phone] Please check AMION for all [facility] Pharmacy numbers 02/13/2021

## 2021-02-13 NOTE — Interval H&P Note (Signed)
History and Physical Interval Note:  02/13/2021 5:10 PM  Debra Daniel  has presented today for surgery, with the diagnosis of HF.  The various methods of treatment have been discussed with the patient and family. After consideration of risks, benefits and other options for treatment, the patient has consented to  Procedure(s): RIGHT/LEFT HEART CATH AND CORONARY ANGIOGRAPHY (N/A) as a surgical intervention.  The patient's history has been reviewed, patient examined, no change in status, stable for surgery.  I have reviewed the patient's chart and labs.  Questions were answered to the patient's satisfaction.     Kassidy Dockendorf [employer]

## 2021-02-13 NOTE — Progress Notes (Signed)
   02/13/21 2125  Clinical Encounter Type  Visit Type Code  Referral From  (Page)  Consult/Referral To Chaplain  Chaplain responded to Code Blue and Code was canceled.     Chaplain Analeise Mccleery [place]Simpson  [phone]

## 2021-02-13 NOTE — TOC Progression Note (Addendum)
Transition of Care (TOC) - Progression Note  Heart Failure   Patient Details  Name: Debra Daniel MRN: [id number] Date of Birth: 20-Jul-1986  Transition of Care [facility]) CM/SW Contact  Kasidee Voisin, LCSWA Phone Number: 02/13/2021, 1:18 PM  Clinical Narrative:    CSW spoke with the patient at bedside and completed a very brief SDOH screening with the patient who reported not having any social needs at this time. [name] reported that she isn't driving but that she has support from family to get her where she needs to go. CSW provided [name] with a CAFA application since she doesn't have any health insurance and (last week sent an email to [facility] [employer] to please screen her for Medicaid). [name] reported that she receives Food Stamps already and does not have any other concerns at this time. CSW encouraged the patient to ask for the social worker if she identifies other needs so that CSW can provide support.  CSW will continue to follow throughout discharge   Barriers to Discharge: Continued Medical Work up  Expected Discharge Plan and Services   In-house Referral: Clinical Social Work     Living arrangements for the past 2 months: Single Family Home                                       Social Determinants of Health (SDOH) Interventions Food Insecurity Interventions: Intervention Not Indicated (pt reports getting Sales executive already) Corporate treasurer Interventions: Artist (CSW provided pt with a CAFA application and reached out to [employer] financial to screen pt for Mediciad) Housing Interventions: Intervention Not Indicated Transportation Interventions: Intervention Not Indicated  Readmission Risk Interventions No flowsheet data found.  Darick Fetters, MSW, LCSWA [phone] Heart Failure Social Worker

## 2021-02-13 NOTE — Progress Notes (Signed)
eLink Physician-Brief Progress Note Patient Name: Debra Daniel DOB: Jul 23, 1986 MRN: [id number]   Date of Service  02/13/2021  HPI/Events of Note  Ionized Calcium 1.08  eICU Interventions  Calcium gluconate 2 gm iv bolus x 1.        Thomasene Lot Martel Galvan 02/13/2021, 11:53 PM

## 2021-02-13 NOTE — Progress Notes (Signed)
Patient ID: Debra Daniel, female   DOB: March 15, 1986, 35 y.o.   MRN: [id number]     Advanced Heart Failure Rounding Note  PCP-Cardiologist: None   Subjective:   -05/13: Seen in ED for recurrent nausea and vomiting. Treated for possible UTI. Ongoing weakness, fatigue, intermittent N/V, cough since. -06/27: ED at OSH with hemoptysis. Found to have segmental PE and pneumonia. EF 25-30%. -06/28: Transferred to [facility]. Cardiogenic shock > milrinone 0.25 mcg. Diuresed with IV lasix. -06/29: Tachycardic. MAT noted on tele. Added digoxin and spiro. Amio added d/t NSVT. Last dose IV lasix. Norepi off. - 6/30: midodrine started and lasix drip started.   Off norepinephrine, remains on milrinone 0.375 mcg. Still tachycardic with HR 100s-110s.  Co-ox 58% this morning.   Off IV Lasix, weight down 3 lbs. CVP 9. Creatinine 0.91.   Hemoptysis resolved, back on heparin gtt with hgb 9.6. She is now off antibiotics. Platelets dropped this morning, but looking at trend for her stay, she has been up and down considerably.   Has RLL PE seen at [facility] on CTA chest.    Walked around unit this morning already.   Echo 02/06/2021: LVEF 10-15%, LV moderately dilated, RV systolic function severely reduced, RV moderately enlarged, RVSP 45.9 mmHg, severe secondary MR, mod-severe TR (personally reviewed).  Objective:   Weight Range: 97.3 kg Body mass index is 36.25 kg/m.   Vital Signs:   Temp:  [98.2 F (36.8 C)-98.6 F (37 C)] 98.3 F (36.8 C) (07/05 0348) Pulse Rate:  [106-117] 108 (07/05 0600) Resp:  [16-37] 19 (07/05 0600) BP: (90-109)/(64-77) 97/71 (07/05 0600) SpO2:  [92 %-100 %] 92 % (07/05 0600) Weight:  [97.3 kg] 97.3 kg (07/05 0500) Last BM Date: 02/12/21  Weight change: Filed Weights   02/11/21 0500 02/12/21 0500 02/13/21 0500  Weight: 101.1 kg 98.5 kg 97.3 kg    Intake/Output:   Intake/Output Summary (Last 24 hours) at 02/13/2021 0751 Last data filed at 02/13/2021 0630 Gross per 24 hour   Intake 1427.96 ml  Output 2000 ml  Net -572.04 ml      Physical Exam   CVP 9 General: NAD Neck: No JVD, no thyromegaly or thyroid nodule.  Lungs: Clear to auscultation bilaterally with normal respiratory effort. CV: Nondisplaced PMI.  Heart regular S1/S2, no S3/S4, 1/6 HSM apex.  1+ ankle edema.  Abdomen: Soft, nontender, no hepatosplenomegaly, no distention.  Skin: Intact without lesions or rashes.  Neurologic: Alert and oriented x 3.  Psych: Normal affect. Extremities: No clubbing or cyanosis.  HEENT: Normal.    Telemetry  ST 100s-110s (personally reviewed)  Labs    CBC Recent Labs    02/12/21 0332 02/13/21 0455  WBC 17.9* 17.3*  HGB 9.4* 9.6*  HCT 29.2* 30.3*  MCV 80.2 82.3  PLT 227 143*   Basic Metabolic Panel Recent Labs    63/87/56 0332 02/12/21 1330 02/13/21 0455  NA 135 129* 135  K 2.8* 3.0* 4.2  CL 94* 88* 97*  CO2 32 30 31  GLUCOSE 162* 193* 103*  BUN 15 16 17   CREATININE 1.29* 1.06* 0.91  CALCIUM 7.3* 7.3* 7.8*  MG 1.6*  --  2.2   Liver Function Tests No results for input(s): AST, ALT, ALKPHOS, BILITOT, PROT, ALBUMIN in the last 72 hours.  No results for input(s): LIPASE, AMYLASE in the last 72 hours. Cardiac Enzymes No results for input(s): CKTOTAL, CKMB, CKMBINDEX, TROPONINI in the last 72 hours.  BNP: BNP (last 3 results) Recent Labs  02/06/21 2018  BNP 1,571.8*    ProBNP (last 3 results) No results for input(s): PROBNP in the last 8760 hours.   D-Dimer No results for input(s): DDIMER in the last 72 hours.  Hemoglobin A1C No results for input(s): HGBA1C in the last 72 hours.  Fasting Lipid Panel No results for input(s): CHOL, HDL, LDLCALC, TRIG, CHOLHDL, LDLDIRECT in the last 72 hours. Thyroid Function Tests No results for input(s): TSH, T4TOTAL, T3FREE, THYROIDAB in the last 72 hours.  Invalid input(s): FREET3   Other results:   Imaging    No results found.   Medications:     Scheduled Medications:   Chlorhexidine Gluconate Cloth  6 each Topical Daily   digoxin  0.125 mg Oral Daily   insulin aspart  0-5 Units Subcutaneous QHS   insulin aspart  0-9 Units Subcutaneous TID WC   mouth rinse  15 mL Mouth Rinse BID   midodrine  5 mg Oral TID WC   sodium chloride flush  10-40 mL Intracatheter Q12H   sodium chloride flush  3 mL Intravenous Q12H   spironolactone  12.5 mg Oral Daily   torsemide  40 mg Oral Daily    Infusions:  sodium chloride Stopped (02/08/21 1759)   sodium chloride Stopped (02/08/21 0434)   sodium chloride 10 mL/hr at 02/13/21 0600   sodium chloride     sodium chloride     amiodarone 30 mg/hr (02/13/21 0732)   heparin 2,100 Units/hr (02/13/21 0629)   milrinone 0.375 mcg/kg/min (02/13/21 0600)   norepinephrine (LEVOPHED) Adult infusion 4 mcg/min (02/09/21 2229)    PRN Medications: sodium chloride, sodium chloride, sodium chloride, lip balm, ondansetron (ZOFRAN) IV, sodium chloride flush, sodium chloride flush    Assessment/Plan   Acute systolic HF -> Cardiogenic shock/acute biventricular heart failure/new cardiomyopathy -New diagnosis of CHF this admission after presenting with hemoptysis at OSH in [place]. Suspected viral illness with > 1 month hx weakness, fatigue, cough, N/V. HIV negative. Recently diagnosed with hypothyroidism.  Etiology => viral myocarditis versus familial cardiomyopathy (mother and aunts/uncles with CHF), CAD less likely but not ruled out.  - Echo here with EF of 10-15%, mildly dilated LV, moderate to severely reduced RV fxn with mildly dilated RV, RVSP 45 mmHg, severe secondary MR, mod-severe TR. - BNP 1,571. Pregnancy test negative.  - Currently on 0.375 mcg milrinone, off NE.  Co-ox in early am was 58%.  I am going to try to ease down her milrinone this morning to 0.25, will have formal RHC this afternoon to assess cardiac output.   - CVP 9, continue torsemide 40 mg daily.   - Continue low dose midodrine 5 mg tid for now, SBP 90s-100s.  -  Continue dig and spiro.  - Will add ivabradine 5 mg bid.  - No beta blocker or Entresto due to shock. - Will need cardiac MRI once more stable. - R/LHC this afternoon, discussed with patient and she agrees to procedure.    2. Mitral valve regurgitation - Severe on echo this admission.  - Likely secondary, valve does not look abnormal on echo.   3. Moderate-severe TR - Likely secondary  4. NSVT - No further NSVT but has PVCs.  - Continue amio drip 30 mg per hour.  - Keep K> 4.0 Mg > 2.0 => needs aggressive replacement today and repeat BMET in pm.   5. Hemoptysis/community acquired pneumonia - Possible PE noted on imaging at OSH. Suspect course of events likely viral illness/myocarditis > inactivity, followed by PE  and pneumonia. Venous duplex negative for DVT. - CT chest here with consolidation both lower lobes. Ground glass opacities right middle lobe. - Was on ceftriaxone, now stopped by CCM.  - Suspect hemoptysis was due to elevated left atrial pressure.  - WBC 17.0. > 19.6> 19 > 17.9 > 17.3 - Hgb 9.6 (higher), no further hemoptysis. Now back on heparin for PE.   6. RLL PE - Seen on imaging at [facility] (CTA chest).  - Now back on heparin gtt.    6. Hypothyroidism -Recently diagnosed. -TSH 13.5/Free T4 1.39. -On synthroid PTA.   7. Elevated INR - Possibly due to passive congestion from CHF. - Given Vit K 6/29, 6/30, and 7/1 per CCM.  8. Hyponatremia - Resolved.  - Restrict free water  9. Acute blood loss anemia -Hgb 12.6 > 13.5 > 9.8> 9 > 10 > 9.4 > 9.6 -Presented with massive hemoptysis which is now improved.  10. Thrombocytopenia - Mild but significant drop.  However, looking at trend, platelets have had significantly fluctuation and have been in the 140s before (prior to starting heparin).  Hopefully this is not HIT but will follow platelet trend closely.   Starting to improve.  Continue ambulation.   CRITICAL CARE Performed by: Marca Ancona  Total critical  care time: 35 minutes  Critical care time was exclusive of separately billable procedures and treating other patients.  Critical care was necessary to treat or prevent imminent or life-threatening deterioration.  Critical care was time spent personally by me (independent of midlevel providers or residents) on the following activities: development of treatment plan with patient and/or surrogate as well as nursing, discussions with consultants, evaluation of patient's response to treatment, examination of patient, obtaining history from patient or surrogate, ordering and performing treatments and interventions, ordering and review of laboratory studies, ordering and review of radiographic studies, pulse oximetry and re-evaluation of patient's condition.  Marca Ancona, MD  7:51 AM 02/13/2021

## 2021-02-13 NOTE — Progress Notes (Signed)
ANTICOAGULATION CONSULT NOTE Pharmacy Consult for Heparin Indication: pulmonary embolus  Allergies  Allergen Reactions   Sulfa Antibiotics Hives   Ibuprofen Other (See Comments)    Constipation Can tolerate w milk of mag    Patient Measurements: Height: 5' 4.5" (163.8 cm) Weight: 98.5 kg (217 lb 2.5 oz) IBW/kg (Calculated) : 55.85 Heparin Dosing Weight: ~ 80 kg  Vital Signs: Temp: 98.3 F (36.8 C) (07/05 0348) Temp Source: Oral (07/05 0348) BP: 96/70 (07/05 0500) Pulse Rate: 107 (07/05 0500)  Labs: Recent Labs    02/11/21 0414 02/11/21 1200 02/12/21 0332 02/12/21 0800 02/12/21 1330 02/12/21 1753 02/13/21 0400 02/13/21 0455  HGB 10.0*  --  9.4*  --   --   --   --  9.6*  HCT 29.6*  --  29.2*  --   --   --   --  30.3*  PLT 189  --  227  --   --   --   --  143*  HEPARINUNFRC <0.10*   < >  --  <0.10*  --  <0.10* 0.11*  --   CREATININE 0.92   < > 1.29*  --  1.06*  --   --  0.91   < > = values in this interval not displayed.     Estimated Creatinine Clearance: 101.2 mL/min (by C-G formula based on SCr of 0.91 mg/dL).  Assessment: 35 yo female with new PE for heparin. No AC PTA.  Heparin level remains undetectable, on 1800 units/hr. Coughing up a little more blood clots - will monitor. No infusion issues. Drawn appropriately (heparin running peripherally, drawn from central line).   7/5 AM update:  Heparin level low, but is detectable this AM Hgb low but stable  Goal of Therapy:  Heparin level 0.2-0.4 units/mL Monitor platelets by anticoagulation protocol: Yes   Plan:  Increase heparin infusion to 2100 units/hr Check heparin level in 6-8 hours.  Daily heparin level and CBC. Perhaps could transition to Eliquis after cath procedure  Abran Duke, PharmD, BCPS Clinical Pharmacist Phone: [phone]

## 2021-02-13 NOTE — Progress Notes (Signed)
eLink Physician-Brief Progress Note Patient Name: Debra Daniel DOB: 10/28/1985 MRN: [id number]   Date of Service  02/13/2021  HPI/Events of Note  I was asked to camera into the room due to the patient going into V0Fib cardiac  arrest, on arrival rhythm had been restored following defibrillation x 2, patient is alert and fully interactive.  eICU Interventions  Stat BMP, Mg++ , stst portable CXR ordered. Code team in the room.        Debra Daniel 02/13/2021, 9:34 PM

## 2021-02-13 NOTE — Progress Notes (Signed)
Called to bedside by critical care service to evaluate [name].  Developed PMVT this evening requiring defibrillation with R-on-T phenomena in the setting of prolonged QTc with amiodarone.  Required chest compressions and two defibrillations.  This occurs in the setting of decompensated heart failure and pulmonary emboli requiring inotropic support.  RHC today with modestly elevated filling pressures.  ECG reviewed.  QTc well beyond 500 ms, autoread of 430 is incorrect, sinus rhythm with PACs, no acute ischemia.  CMP, CBC, lactate, Mg, Ca pending.  #Torsades de pointes; no e/o ischemia, PVC triggered with prolonged QTc in setting of QTc prolonging agent. - Stop offending agent (amiodarone) - Hang 4 grams magnesium - Reduce milrinone to 0.125 mcg/kg/min for ectopy burden - SR 90-100  If she becomes recurrent/refractory may have to balloon pace up v. Isoproterenol to shorten vulnerable period but hold off for now.  Luanna Salk, MD Cardiology Fellow [facility]

## 2021-02-13 NOTE — Progress Notes (Addendum)
PCCM:  Called for evaluation at bedside.  Sudden onset VT then VF s/p defib X2  CPR started, , ROSC was obtained.  Post arrest ECG pending  Case discussed with cardiology on call.  We appreciate the help.  Would keep pads on in case this recurs.  STAT labs to check lytes ?stop amiodarone Check qtc   This patient is critically ill with multiple organ system failure; which, requires frequent high complexity decision making, assessment, support, evaluation, and titration of therapies. This was completed through the application of advanced monitoring technologies and extensive interpretation of multiple databases. During this encounter critical care time was devoted to patient care services described in this note for 32 minutes.   Josephine Igo, DO [facility] 02/13/2021 9:42 PM

## 2021-02-13 NOTE — Progress Notes (Signed)
NAME:  Debra Daniel, MRN:  [id number], DOB:  21-Oct-1985, LOS: 7 ADMISSION DATE:  02/06/2021, CONSULTATION DATE:  6/28 REFERRING MD:  Dr. Adela Glimpse, CHIEF COMPLAINT:  hemoptysis   History of Present Illness:  35 year old female with no significant past medical history up until quite recently. Three months prior to admission she was diagnosed with hypothyroid and was started on synthroid. Then, approximately around 12/22/20 she was evaluated with recurrent N/V and UTI symptoms. She is unclear what medications she was treated with, although she remembers the first one was ineffective and a second antibiotic was needed. Progressive lower extremity edema since that time as well. Then 6/25 she began to experience mild hemoptysis, which prompted her to present to [facility] 6/27. Workup in the ED included CTA chest and CT abdomen/pelvis. She was found to have right sided segmental PE as well a crazy paving pattern on the CT chest. CT abdomen was non-acute. She was admitted. Started on heparin for PE, which caused her hemoptysis to become much worse. Heparin was stopped. Echocardiogram done demonstrated LVEF 25-30% as well as mod-severe mitral regurgitation. She was transferred to [facility] for further evaluation.   Pertinent  Medical History   has a past medical history of Encounter for menstrual regulation (01/24/2015), Hypothyroid, Migraines, Obesity, and UTI (urinary tract infection).    has a past medical history of Encounter for menstrual regulation (01/24/2015), Hypothyroid, Migraines, Obesity, and UTI (urinary tract infection).   reports that she has never smoked. She has never used smokeless tobacco.   Significant Hospital Events: Including procedures, antibiotic start and stop dates in addition to other pertinent events   6/27 admit to [place] for hemotpysis. Dx PE. Worsening hemoptysis  6/28 [facility] > then to ICU due to hypotension, hemoptysis.  6/29 milrinone, NE, UOP  picking up, PICC placed, MAT on tele, added digoxin and spiro per HF, amio added for NSVT, NE off 6/30 hemoptysis improving but present, Cr better, feels better, on milrinone, added lasix gtt 7/1 going to add some txa nebs, started on midodrine  7/2 no further hemoptysis and no SCVO2 better. Started on low dose heparin 7/3 on 2L, walked around unit, continues on lasix gtt, CVP 12, remains on milrinone 0.375, abx stopped; txa nebs complete 7/4 lasix gtt stopped, metolazone x 1, CVP 7-8, hemoptysis resolved, abx, remains on milrinone, on room air    6/28 MRSA neg 6/28 RVP neg 7/5 SARS neg 6/28 urine strep and legionella neg  6/28 vanc 6/28 cefepime >>6/29 6/29 ceftriaxone > 7/3 6/29 doxycycline> 7/1  6/29 CT chest >> Areas of consolidation in the right lower lobe, left lower lobe and right middle lobe compatible with multifocal pneumonia.   6/29 LE dopplers >>neg for DVT  Interim History / Subjective:  Remains on amio gtt 30 mg/hr and heparin gtt Milrinone gtt decreased to 0.25 mcg UOP 2.4L +1 unmeasured urinary occurrences  -972/ net -6.4L CVP 9, coox 58.3 Going for R/LHC this afternoon Walked around unit this am without difficulty Remains on RA, dry cough, no complaints of pain or SOB.  Denies further hemoptysis   Objective   Blood pressure 97/71, pulse (!) 108, temperature 98.3 F (36.8 C), temperature source Oral, resp. rate 19, height 5' 4.5" (1.638 m), weight 97.3 kg, last menstrual period 10/24/2020, SpO2 92 %. CVP:  [6 mmHg-7 mmHg] 6 mmHg      Intake/Output Summary (Last 24 hours) at 02/13/2021 0901 Last data filed at 02/13/2021 0900 Gross per 24  hour  Intake 1602.33 ml  Output 1650 ml  Net -47.67 ml   Filed Weights   02/11/21 0500 02/12/21 0500 02/13/21 0500  Weight: 101.1 kg 98.5 kg 97.3 kg    Examination: General:  Pale adult female sitting in bedside recliner in NAD HEENT: MM pink/moist Neuro:  Aox3, MAE CV:  ST, + murmur PULM:  no labored, clear  anteriorly, diminished in bases posteriorly, dry cough GI: obese, soft, NT Extremities: warm/dry, +2 LE pitting edema w/TED hose, RUE TL PICC  Skin: no rashes, scattered bruising to arms    LABS    PULMONARY Recent Labs  Lab 02/09/21 1600 02/10/21 0440 02/11/21 0805 02/12/21 0334 02/13/21 0420  O2SAT 48.1 84.1 66.4 68.7 58.3    CBC Recent Labs  Lab 02/11/21 0414 02/12/21 0332 02/13/21 0455  HGB 10.0* 9.4* 9.6*  HCT 29.6* 29.2* 30.3*  WBC 17.4* 17.9* 17.3*  PLT 189 227 143*    COAGULATION Recent Labs  Lab 02/06/21 2008 02/06/21 2018 02/07/21 1808 02/08/21 1017 02/09/21 1004  INR 1.9* 1.9* 1.8* 1.6* 1.6*    CARDIAC  No results for input(s): TROPONINI in the last 168 hours. No results for input(s): PROBNP in the last 168 hours.   CHEMISTRY Recent Labs  Lab 02/08/21 0334 02/08/21 1732 02/09/21 0314 02/10/21 0440 02/11/21 0414 02/11/21 1400 02/12/21 0332 02/12/21 1330 02/13/21 0455  NA 135   < > 130*   < > 136 135 135 129* 135  K 3.1*   < > 4.2   < > 3.2* 3.6 2.8* 3.0* 4.2  CL 101   < > 96*   < > 99 97* 94* 88* 97*  CO2 22   < > 22   < > 28 28 32 30 31  GLUCOSE 161*   < > 256*   < > 105* 155* 162* 193* 103*  BUN 17   < > 21*   < > 15 14 15 16 17   CREATININE 0.99   < > 1.27*   < > 0.92 1.01* 1.29* 1.06* 0.91  CALCIUM 6.7*   < > 7.5*   < > 7.1* 7.2* 7.3* 7.3* 7.8*  MG 1.7  --  1.8  --  1.1*  --  1.6*  --  2.2   < > = values in this interval not displayed.   Estimated Creatinine Clearance: 100.6 mL/min (by C-G formula based on SCr of 0.91 mg/dL).   LIVER Recent Labs  Lab 02/06/21 2008 02/06/21 2018 02/07/21 1808 02/08/21 1017 02/09/21 0314 02/09/21 1004  AST 39  --   --   --  28  --   ALT 55*  --   --   --  35  --   ALKPHOS 61  --   --   --  48  --   BILITOT 3.5*  --   --   --  2.4*  --   PROT 5.2*  --   --   --  4.6*  --   ALBUMIN 2.4*  --   --   --  1.8*  --   INR 1.9* 1.9* 1.8* 1.6*  --  1.6*     INFECTIOUS Recent Labs  Lab  02/06/21 1919 02/06/21 2017 02/07/21 0226 02/07/21 0542 02/08/21 0334 02/08/21 0623 02/10/21 0440 02/11/21 0800 02/11/21 1055  LATICACIDVEN  --    < >  --    < >  --    < > 1.6 2.1* 2.3*  PROCALCITON 2.31  --  2.54  --  1.68  --   --   --   --    < > = values in this interval not displayed.     ENDOCRINE CBG (last 3)  Recent Labs    02/12/21 1126 02/12/21 1545 02/12/21 2154  GLUCAP 108* 112* 113*    IMAGING x48h  - image(s) personally visualized  -   highlighted in bold No results found.   Resolved Hospital Problem list   AGMA Hyponatremia  Assessment & Plan:   Acute systolic HF with cardiogenic shock/ cardiomyopathy with acute biventricular heart failure - new dx this admit.  Recent hypothyroidism dx.  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  Currently off NE.  Trending coox.  Ongoing digoxin and spiro per HF - IV lasix stopped 7/4.  Continue daily torsemide 40 mg per HF - strict I/Os - ongoing midodrine 5 mg BID - R/LHC scheduled for this afternoon 7/5  Mitral valve regurgitation (normal valves on TTE)  Moderate- severe TR  - likely secondary to above  NSVT MAT - ongoing amio gtt per HF - K goal > 4, Mag goal > 2  Hemoptysis (?2/2 to PE vs elevated LA pressures, s/p txa nebs 7/1-7/3 CAP  RLL PE (on [facility] CTA), neg LE dopplers 6/29 - remains on heparin gtt per pharmacy - no further hemoptysis - abx complete 7/3 - ongoing/ stable leukocytosis, remains afebrile  - ongoing PT/ pulmonary hygiene - remains on room air, supplemental O2 prn   Hypothyroidism - TSH 13.5/Free T4 1.39 - restart pta synthroid 75 mcg  Elevated INR Elevated LFTs  - likely 2/2 hepatic congestion from biV failure -  trend coags / LFTs  ABLA likely secondary to hemoptysis since resolved  - Hgb 12.6 > 13.5 > 9.8> 9 > 10 > 9.4 > 9.6 - trend CBC; transfuse as needed  Thrombocytopenia  - trend CBC - less likely HIT as it started prior to heparin (7/2) initiation  Hyperglycemia - SSI  Best Practice (right click and "Reselect all SmartList Selections" daily)   Diet/type: NPO for [facility]  DVT prophylaxis: other - TED stocking and systemic heparin. GI prophylaxis: N/A Lines: Central line and yes and it is still needed; RUE PICC Foley:  N/A Code Status:  full code Last date of multidisciplinary goals of care discussion [ 6/28] Patient updated on plan of care.  Parents at bedside updated.  All questions answered 7/5       Posey Boyer, ACNP [facility] 02/13/2021, 10:37 AM

## 2021-02-13 NOTE — Progress Notes (Signed)
Physical Therapy Treatment Patient Details Name: Debra Daniel MRN: [id number] DOB: 07-26-1986 Today's Date: 02/13/2021    History of Present Illness 35 y/o female transferred 6/28 from [facility] with mild hemoptysis, worsening LE edema and Rt sided segmental PE on CT.  Heparin caused significant worsening of her hemoptysis, so now not anticoagulated with transfer to ICU 6/30. Echo demonstrated LVEF of 25-30% with mod-severe mitral regurgitation.  PMHx:  hypothyroidism, UTI's    PT Comments    Pt progressing well toward goals, ambulating without RW. Encourage pt to ambulate with staff. Follow up to achieve independence.  Follow Up Recommendations  No PT follow up;Other (comment)     Equipment Recommendations  None recommended by PT    Recommendations for Other Services       Precautions / Restrictions      Mobility  Bed Mobility Overal bed mobility: Needs Assistance Bed Mobility: Sit to Supine;Supine to Sit     Supine to sit: Modified independent (Device/Increase time) Sit to supine: Min guard   General bed mobility comments: Pt in seated position in bed prior to start of PT. Head of bed 0 for transfer; HR106-100, spO2 96-100    Transfers Overall transfer level: Modified independent Equipment used: None Transfers: Sit to/from Stand Sit to Stand: Modified independent (Device/Increase time)            Ambulation/Gait Ambulation/Gait assistance: Min guard Gait Distance (Feet): 250 Feet (Pt ceased ambulation and needed restroom break) Assistive device: None Gait Pattern/deviations: Step-through pattern;Decreased stride length Gait velocity: reduced   General Gait Details: slow steady step-through gait without AD   Stairs             Wheelchair Mobility    Modified Rankin (Stroke Patients Only)       Balance                                            Cognition Arousal/Alertness: Awake/alert Behavior During Therapy: WFL  for tasks assessed/performed Overall Cognitive Status: Within Functional Limits for tasks assessed                                        Exercises General Exercises - Lower Extremity Long Arc Quad: 15 reps;Seated;Both Hip Flexion/Marching: Both;15 reps;Seated Toe Raises: Both;15 reps Heel Raises: Both;15 reps (noted compensation with swinging of lower leg at the end of reps)    General Comments        Pertinent Vitals/Pain Pain Assessment: No/denies pain    Home Living                      Prior Function            PT Goals (current goals can now be found in the care plan section) Acute Rehab PT Goals Patient Stated Goal: home independent when cleared. PT Goal Formulation: With patient Time For Goal Achievement: 02/23/21 Potential to Achieve Goals: Good Progress towards PT goals: Progressing toward goals    Frequency    Min 3X/week      PT Plan Current plan remains appropriate    Co-evaluation              AM-PAC PT "6 Clicks" Mobility   Outcome Measure  End of Session Equipment Utilized During Treatment: Gait belt Activity Tolerance: Other (comment) (Pt required a restroom break during ambulation) Patient left: with call bell/phone within reach;with family/visitor present;in bed (HR 108; spO2 94 at end of session) Nurse Communication: Mobility status PT Visit Diagnosis: Muscle weakness (generalized) (M62.81);Difficulty in walking, not elsewhere classified (R26.2);Other abnormalities of gait and mobility (R26.89)     Time: 4268-3419 PT Time Calculation (min) (ACUTE ONLY): 20 min  Charges:  $Gait Training: 8-22 mins                     Renezmae Canlas F, SPT Acute [phone]   Vance Gather 02/13/2021, 1:40 PM

## 2021-02-14 ENCOUNTER — Encounter (HOSPITAL_COMMUNITY): Payer: Self-pay | Admitting: Cardiology

## 2021-02-14 DIAGNOSIS — I472 Ventricular tachycardia: Secondary | ICD-10-CM

## 2021-02-14 DIAGNOSIS — J9601 Acute respiratory failure with hypoxia: Secondary | ICD-10-CM

## 2021-02-14 LAB — CBC
HCT: 21.5 % — ABNORMAL LOW (ref 36.0–46.0)
HCT: 31.1 % — ABNORMAL LOW (ref 36.0–46.0)
Hemoglobin: 6.7 g/dL — CL (ref 12.0–15.0)
Hemoglobin: 9.9 g/dL — ABNORMAL LOW (ref 12.0–15.0)
MCH: 26.3 pg (ref 26.0–34.0)
MCH: 26 pg (ref 26.0–34.0)
MCHC: 31.2 g/dL (ref 30.0–36.0)
MCHC: 31.8 g/dL (ref 30.0–36.0)
MCV: 84.3 fL (ref 80.0–100.0)
MCV: 81.6 fL (ref 80.0–100.0)
Platelets: 64 10*3/uL — ABNORMAL LOW (ref 150–400)
Platelets: 90 10*3/uL — ABNORMAL LOW (ref 150–400)
RBC: 2.55 MIL/uL — ABNORMAL LOW (ref 3.87–5.11)
RBC: 3.81 MIL/uL — ABNORMAL LOW (ref 3.87–5.11)
RDW: 17.7 % — ABNORMAL HIGH (ref 11.5–15.5)
RDW: 17.7 % — ABNORMAL HIGH (ref 11.5–15.5)
WBC: 10.7 10*3/uL — ABNORMAL HIGH (ref 4.0–10.5)
WBC: 17.2 10*3/uL — ABNORMAL HIGH (ref 4.0–10.5)
nRBC: 0 % (ref 0.0–0.2)
nRBC: 0.2 % (ref 0.0–0.2)

## 2021-02-14 LAB — MAGNESIUM
Magnesium: 2.4 mg/dL (ref 1.7–2.4)
Magnesium: 2.2 mg/dL (ref 1.7–2.4)

## 2021-02-14 LAB — POCT I-STAT EG7
Acid-Base Excess: 11 mmol/L — ABNORMAL HIGH (ref 0.0–2.0)
Bicarbonate: 34.3 mmol/L — ABNORMAL HIGH (ref 20.0–28.0)
Calcium, Ion: 1.19 mmol/L (ref 1.15–1.40)
HCT: 30 % — ABNORMAL LOW (ref 36.0–46.0)
Hemoglobin: 10.2 g/dL — ABNORMAL LOW (ref 12.0–15.0)
O2 Saturation: 65 %
Patient temperature: 36.7
Potassium: 4.2 mmol/L (ref 3.5–5.1)
Sodium: 135 mmol/L (ref 135–145)
TCO2: 36 mmol/L — ABNORMAL HIGH (ref 22–32)
pCO2, Ven: 40.9 mmHg — ABNORMAL LOW (ref 44.0–60.0)
pH, Ven: 7.531 — ABNORMAL HIGH (ref 7.250–7.430)
pO2, Ven: 30 mmHg — CL (ref 32.0–45.0)

## 2021-02-14 LAB — PLATELET COUNT: Platelets: 93 10*3/uL — ABNORMAL LOW (ref 150–400)

## 2021-02-14 LAB — BASIC METABOLIC PANEL
Anion gap: 8 (ref 5–15)
Anion gap: 10 (ref 5–15)
BUN: 16 mg/dL (ref 6–20)
BUN: 16 mg/dL (ref 6–20)
CO2: 30 mmol/L (ref 22–32)
CO2: 28 mmol/L (ref 22–32)
Calcium: 8.6 mg/dL — ABNORMAL LOW (ref 8.9–10.3)
Calcium: 8.2 mg/dL — ABNORMAL LOW (ref 8.9–10.3)
Chloride: 97 mmol/L — ABNORMAL LOW (ref 98–111)
Chloride: 94 mmol/L — ABNORMAL LOW (ref 98–111)
Creatinine, Ser: 0.89 mg/dL (ref 0.44–1.00)
Creatinine, Ser: 0.98 mg/dL (ref 0.44–1.00)
GFR, Estimated: >60 mL/min (ref >60)
GFR, Estimated: >60 mL/min (ref >60)
Glucose, Bld: 105 mg/dL — ABNORMAL HIGH (ref 70–99)
Glucose, Bld: 159 mg/dL — ABNORMAL HIGH (ref 70–99)
Potassium: 3.9 mmol/L (ref 3.5–5.1)
Potassium: 4.3 mmol/L (ref 3.5–5.1)
Sodium: 135 mmol/L (ref 135–145)
Sodium: 132 mmol/L — ABNORMAL LOW (ref 135–145)

## 2021-02-14 LAB — LACTIC ACID, PLASMA: Lactic Acid, Venous: 1 mmol/L (ref 0.5–1.9)

## 2021-02-14 LAB — GLUCOSE, CAPILLARY
Glucose-Capillary: 102 mg/dL — ABNORMAL HIGH (ref 70–99)
Glucose-Capillary: 105 mg/dL — ABNORMAL HIGH (ref 70–99)
Glucose-Capillary: 122 mg/dL — ABNORMAL HIGH (ref 70–99)
Glucose-Capillary: 121 mg/dL — ABNORMAL HIGH (ref 70–99)
Glucose-Capillary: 107 mg/dL — ABNORMAL HIGH (ref 70–99)

## 2021-02-14 LAB — COOXEMETRY PANEL
Carboxyhemoglobin: 1.3 % (ref 0.5–1.5)
Methemoglobin: 0.7 % (ref 0.0–1.5)
O2 Saturation: 52.8 %
Total hemoglobin: 10.9 g/dL — ABNORMAL LOW (ref 12.0–16.0)

## 2021-02-14 LAB — APTT: aPTT: 70 seconds — ABNORMAL HIGH (ref 24–36)

## 2021-02-14 LAB — TROPONIN I (HIGH SENSITIVITY): Troponin I (High Sensitivity): 53 ng/L — ABNORMAL HIGH (ref <18)

## 2021-02-14 LAB — PREPARE RBC (CROSSMATCH)

## 2021-02-14 MED ORDER — LOSARTAN POTASSIUM 25 MG PO TABS
12.5000 mg | ORAL_TABLET | Freq: Every day | ORAL | Status: DC
  Administered 2021-02-14 – 2021-02-15 (×2): 12.5 mg via ORAL
  Filled 2021-02-14 (×2): qty 1

## 2021-02-14 MED ORDER — LIDOCAINE BOLUS VIA INFUSION
100.0000 mg | Freq: Once | INTRAVENOUS | Status: AC
  Administered 2021-02-14: 100 mg via INTRAVENOUS
  Filled 2021-02-14: qty 100

## 2021-02-14 MED ORDER — SODIUM CHLORIDE 0.9 % IV SOLN
0.1000 mg/kg/h | INTRAVENOUS | Status: AC
  Administered 2021-02-14: 0.12 mg/kg/h via INTRAVENOUS
  Administered 2021-02-15: 0.1 mg/kg/h via INTRAVENOUS
  Filled 2021-02-14 (×2): qty 250

## 2021-02-14 MED ORDER — TORSEMIDE 20 MG PO TABS
40.0000 mg | ORAL_TABLET | Freq: Every day | ORAL | Status: DC
  Administered 2021-02-14: 40 mg via ORAL

## 2021-02-14 MED ORDER — LIDOCAINE IN D5W 4-5 MG/ML-% IV SOLN
1.0000 mg/min | INTRAVENOUS | Status: DC
  Administered 2021-02-14: 1 mg/min via INTRAVENOUS

## 2021-02-14 MED ORDER — POTASSIUM CHLORIDE CRYS ER 20 MEQ PO TBCR
40.0000 meq | EXTENDED_RELEASE_TABLET | Freq: Once | ORAL | Status: AC
  Administered 2021-02-14: 40 meq via ORAL
  Filled 2021-02-14: qty 2

## 2021-02-14 NOTE — Significant Event (Signed)
PATIENT NAME: Shalini Mair MEDICAL RECORD NUMBER: [id number] Birthday: 02/11/1986  Age: 35 y.o. Admit Date: 02/06/2021  Provider: Pia Mau PA-C; Dr. Kendrick Fries   Indication: symptomatic VT  Technical Description:  CPR performance duration: <1 minute at 10:42 am Was defibrillation or cardioversion used ? Yes; x1 Was external pacer placed ? yes Was patient intubated pre/post CPR ? no Was transvenous pacer placed ? no  Medications Administered Include      Yes/no Amiodarone   Atropin   Calcium   Epinephrine   Lidocaine   Magnesium   Norepinephrine   Phenylephrine   Sodium bicarbonate   Vasopression    Evaluation Final Status - Was patient successfully resuscitated ? Yes If successfully resuscitated - what is current rhythm ? Sinus rhythm w/ frequent ectopy If successfully resuscitated - what is current hemodynamic status ? Hemodynamically stable; aox3; on [place] 2 l/m  Miscellaneous Information Noted vtach on monitor. Patient altered. About to begin CPR. Pads placed on patient. 1 shock was given. Patient rhythm normal sinus. Patient became awake and responsive. Hemodynamically stable. Notified HF team to evaluate and recommended to start lidocaine drip. EP consulted. Stat labs ordered.   Lidia Collum 7/6/20222:10 PM

## 2021-02-14 NOTE — Progress Notes (Signed)
   02/14/21 1043  Clinical Encounter Type  Visited With Patient not available  Visit Type Initial;Code  Referral From Nurse  Consult/Referral To Chaplain   Chaplain responded to Code Blue. Pt being treated and no support person present. Chaplain not currently needed. Chaplain remains available.   This note was prepared by Chaplain Resident, Tacy Learn, MDiv. Chaplain remains available as needed through the on-call pager: [phone].

## 2021-02-14 NOTE — Progress Notes (Signed)
NAME:  Debra Daniel, MRN:  [id number], DOB:  [birth year], LOS: 8 ADMISSION DATE:  02/06/2021, CONSULTATION DATE:  6/28 REFERRING MD:  Dr. Adela Glimpse, CHIEF COMPLAINT:  hemoptysis   History of Present Illness:  35 year old female with no significant past medical history up until quite recently. Three months prior to admission she was diagnosed with hypothyroid and was started on synthroid. Then, approximately around 12/22/20 she was evaluated with recurrent N/V and UTI symptoms. She is unclear what medications she was treated with, although she remembers the first one was ineffective and a second antibiotic was needed. Progressive lower extremity edema since that time as well. Then 6/25 she began to experience mild hemoptysis, which prompted her to present to [facility] 6/27. Workup in the ED included CTA chest and CT abdomen/pelvis. She was found to have right sided segmental PE as well a crazy paving pattern on the CT chest. CT abdomen was non-acute. She was admitted. Started on heparin for PE, which caused her hemoptysis to become much worse. Heparin was stopped. Echocardiogram done demonstrated LVEF 25-30% as well as mod-severe mitral regurgitation. She was transferred to [facility] for further evaluation.   Pertinent  Medical History   has a past medical history of Encounter for menstrual regulation (01/24/2015), Hypothyroid, Migraines, Obesity, and UTI (urinary tract infection).    has a past medical history of Encounter for menstrual regulation (01/24/2015), Hypothyroid, Migraines, Obesity, and UTI (urinary tract infection).   reports that she has never smoked. She has never used smokeless tobacco.   Significant Hospital Events: Including procedures, antibiotic start and stop dates in addition to other pertinent events   6/27 admit to [place] for hemotpysis. Dx PE. Worsening hemoptysis  6/28 [facility] > then to ICU due to hypotension, hemoptysis.  6/29 milrinone, NE, UOP  picking up, PICC placed, MAT on tele, added digoxin and spiro per HF, amio added for NSVT, NE off 6/30 hemoptysis improving but present, Cr better, feels better, on milrinone, added lasix gtt 7/1 going to add some txa nebs, started on midodrine  7/2 no further hemoptysis and no SCVO2 better. Started on low dose heparin 7/3 on 2L, walked around unit, continues on lasix gtt, CVP 12, remains on milrinone 0.375, abx stopped; txa nebs complete 7/4 lasix gtt stopped, metolazone x 1, CVP 7-8, hemoptysis resolved, abx, remains on milrinone, on room air  7/5 Vfib arrest-defib x2; CPR 4 minutes to ROSC; then patient alert and interactive; EKG with prolonged Qtc (beyond 500 ms); stopped amio and given mag; low calcium-given calcium gluconate   6/28 MRSA neg 6/28 RVP neg 7/5 SARS neg 6/28 urine strep and legionella neg  6/28 vanc 6/28 cefepime >>6/29 6/29 ceftriaxone > 7/3 6/29 doxycycline> 7/1  6/29 CT chest >> Areas of consolidation in the right lower lobe, left lower lobe and right middle lobe compatible with multifocal pneumonia.   6/29 LE dopplers >>neg for DVT  Interim History / Subjective:   Vfib arrest x 4 minutes until ROSC last night. Prolonged Qtc. Mag, K, calcium given.  Patient sitting in chair with no complaints; on 2 liters [place] Denies any hemoptysis Afebrile Milrinone decreased to 0.125 mcg; On heparin; Off NE and amio Coox 52.8%; CVP 8 UOP -1.8 L/net -7.2 L  Objective   Blood pressure 126/74, pulse 86, temperature 98.7 F (37.1 C), temperature source Oral, resp. rate (!) 22, height 5' 4.5" (1.638 m), weight 98 kg, last menstrual period 10/24/2020, SpO2 100 %. CVP:  [9 mmHg-10  mmHg] 10 mmHg      Intake/Output Summary (Last 24 hours) at 02/14/2021 1027 Last data filed at 02/14/2021 0700 Gross per 24 hour  Intake 891.59 ml  Output 1800 ml  Net -908.41 ml    Filed Weights   02/12/21 0500 02/13/21 0500 02/14/21 0500  Weight: 98.5 kg 97.3 kg 98 kg     Examination: General:  Pale appearing female up in chair; NAD HEENT: MM pink/moist; [place] in place Neuro: aox3; MAE CV: s1s2, RRR, +murmur PULM:  dim clear bs bilaterally; no hemoptysis; [place] 3 l/m GI: soft, bsx4 active  Extremities: warm/dry, TED hose on BLE; RUE picc line   LABS   VBG: respiratory alkalosis K 3.9, Mag 2.4 Troponin 53 WBC 17.6 Hgb 9.9  Resolved Hospital Problem list   AGMA Hyponatremia  Assessment & Plan:   Acute systolic HF with cardiogenic shock/ cardiomyopathy with acute biventricular heart failure: new dx this admit. Echo 6/28: EF 10-15%. mildly dilated LV, moderate to severely reduced RV fxn with mildly dilated RV, RVSP 45 mmHg, severe secondary MR, mod-severe TR. Recent hypothyroidism dx.  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 Mitral valve regurgitation (normal valves on TTE)  Moderate- severe TR  P: -Appreciate HF input -decreased milrinone 0.125 mcg from 0.25 mcg -Off NE and amio -will start on lidocaine today -trend coox and cvp -continue digoxin and spiro -decrease torsemide to 40 mg daily -strict I/Os; daily weights -stopping midodrine given improved BP; starting losartan 12.5 daily  NSVT: 7/5 episode of torsades de pointes vfib arrest x 4 minutes MAT P: -Per HF -given mag, K, and calcium gluconate over night -continue to trend electrolytes and replete K/mag for K goal >4, Mag goal > 2  Hemoptysis (?2/2 to PE vs elevated LA pressures, s/p txa nebs 7/1-7/3 CAP : abx completed 7/3 RLL PE (on [place] CTA), neg LE dopplers 6/29 P: -continue heparin per pharmacy -OOB as tolerated/ PT/ pulm hygiene -wean o2 for sats >92%  Hypothyroidism P: - continue synthroid 75 mcg  Elevated INR Elevated LFTs :likely 2/2 hepatic congestion from biV failure P: - trend coags / LFTs  ABLA likely secondary to hemoptysis since resolved  Thrombocytopenia  P: -repeat stat H/H and type and screen -will hold heparin;  consider restarting pending h/h results -transfuse for hgb <8 - trend CBC  Hyperglycemia P: - SSI and cbg monitoring  Best Practice (right click and "Reselect all SmartList Selections" daily)   Diet/type: clear liquids  DVT prophylaxis: other - TED stocking and systemic heparin. GI prophylaxis: N/A Lines: Central line and yes and it is still needed; RUE PICC Foley:  N/A Code Status:  full code Last date of multidisciplinary goals of care discussion [ 6/28] Patient updated at bedside on 7/6   Critical care time: 35 minutes   JD Anselm Lis [facility] 02/14/2021, 9:28 AM  Please see [email or web address] for pager details.  From 7A-7P if no response, please call [phone]. After hours, please call ELink [phone].

## 2021-02-14 NOTE — Plan of Care (Signed)
  Problem: Education: Goal: Knowledge of General Education information will improve Description: Including pain rating scale, medication(s)/side effects and non-pharmacologic comfort measures Outcome: Progressing   Problem: Health Behavior/Discharge Planning: Goal: Ability to manage health-related needs will improve Outcome: Progressing   Problem: Clinical Measurements: Goal: Ability to maintain clinical measurements within normal limits will improve Outcome: Progressing Goal: Will remain free from infection Outcome: Progressing Goal: Diagnostic test results will improve Outcome: Progressing Goal: Respiratory complications will improve Outcome: Progressing Goal: Cardiovascular complication will be avoided Outcome: Progressing   Problem: Activity: Goal: Risk for activity intolerance will decrease Outcome: Progressing   Problem: Nutrition: Goal: Adequate nutrition will be maintained Outcome: Progressing   Problem: Coping: Goal: Level of anxiety will decrease Outcome: Progressing   Problem: Elimination: Goal: Will not experience complications related to bowel motility Outcome: Progressing Goal: Will not experience complications related to urinary retention Outcome: Progressing   Problem: Pain Managment: Goal: General experience of comfort will improve Outcome: Progressing   Problem: Safety: Goal: Ability to remain free from injury will improve Outcome: Progressing   Problem: Skin Integrity: Goal: Risk for impaired skin integrity will decrease Outcome: Progressing   Problem: Cardiac: Goal: Ability to achieve and maintain adequate cardiopulmonary perfusion will improve Outcome: Progressing Goal: Vascular access site(s) Level 0-1 will be maintained Outcome: Progressing   Problem: Fluid Volume: Goal: Ability to achieve a balanced intake and output will improve Outcome: Progressing   Problem: Physical Regulation: Goal: Complications related to the disease  process, condition or treatment will be avoided or minimized Outcome: Progressing   Problem: Respiratory: Goal: Will regain and/or maintain adequate ventilation Outcome: Progressing   Problem: Education: Goal: Ability to demonstrate management of disease process will improve Outcome: Progressing Goal: Ability to verbalize understanding of medication therapies will improve Outcome: Progressing Goal: Individualized Educational Video(s) Outcome: Progressing   Problem: Activity: Goal: Capacity to carry out activities will improve Outcome: Progressing   Problem: Cardiac: Goal: Ability to achieve and maintain adequate cardiopulmonary perfusion will improve Outcome: Progressing   

## 2021-02-14 NOTE — Progress Notes (Addendum)
Patient ID: Debra Daniel, female   DOB: 1986/07/28, 35 y.o.   MRN: [id number]     Advanced Heart Failure Rounding Note  PCP-Cardiologist: None   Subjective:   -05/13: Seen in ED for recurrent nausea and vomiting. Treated for possible UTI. Ongoing weakness, fatigue, intermittent N/V, cough since. -06/27: ED at OSH with hemoptysis. Found to have segmental PE and pneumonia. EF 25-30%. -06/28: Transferred to [facility]. Cardiogenic shock > milrinone 0.25 mcg. Diuresed with IV lasix. -06/29: Tachycardic. MAT noted on tele. Added digoxin and spiro. Amio added d/t NSVT. Last dose IV lasix. Norepi off. - 6/30: midodrine started and lasix drip started.  - 7/5: RHC/LHC as below.  Around 9 pm, developed torsades de pointes and required defibrillation x 2 with brief CPR.  QTc prolonged, amiodarone was stopped.  She received Mg and K. Milrinone decreased to 0.125.   Continue milrinone 0.125, no co-ox yet.  CVP 5.  Creatinine stable 0.89.   Hemoptysis resolved, back on heparin gtt with hgb 9.9. She is now off antibiotics. Platelets clumped, no reading yet.   Has RLL PE seen at [facility] on CTA chest.    Walked around unit this morning already.   Echo 02/06/2021: LVEF 10-15%, LV moderately dilated, RV systolic function severely reduced, RV moderately enlarged, RVSP 45.9 mmHg, severe secondary MR, mod-severe TR (personally reviewed).  LHC/RHC (7/5):  Left Main  Vessel was injected. Vessel is normal in caliber. Vessel is angiographically normal.  Left Anterior Descending  Vessel was injected. Vessel is normal in caliber. Vessel is angiographically normal.  Left Circumflex  Vessel was injected. Vessel is normal in caliber. Vessel is angiographically normal.  Right Coronary Artery  Vessel was injected. Vessel is normal in caliber. Vessel is angiographically normal.   Intervention   No interventions have been documented.    Right Heart  Right Heart Pressures RHC Procedural Findings (on milrinone  0.25): Hemodynamics (mmHg) RA mean 9 RV 52/17 PA 53/17, mean 31 PCWP mean 16 LV 90/14 AO 90/63  Oxygen saturations: PA 57% AO 98%  Cardiac Output (Fick) 5.1  Cardiac Index (Fick) 2.48  PVR 2.9 WU    Objective:   Weight Range: 98 kg Body mass index is 36.51 kg/m.   Vital Signs:   Temp:  [97.9 F (36.6 C)-98.7 F (37.1 C)] 98.7 F (37.1 C) (07/06 0400) Pulse Rate:  [67-120] 86 (07/06 0700) Resp:  [0-37] 22 (07/06 0700) BP: (89-129)/(55-88) 126/74 (07/06 0700) SpO2:  [87 %-100 %] 100 % (07/06 0700) Weight:  [98 kg] 98 kg (07/06 0500) Last BM Date: 02/13/21  Weight change: Filed Weights   02/12/21 0500 02/13/21 0500 02/14/21 0500  Weight: 98.5 kg 97.3 kg 98 kg    Intake/Output:   Intake/Output Summary (Last 24 hours) at 02/14/2021 0757 Last data filed at 02/14/2021 0700 Gross per 24 hour  Intake 1111.59 ml  Output 1800 ml  Net -688.41 ml      Physical Exam   CVP 5 General: NAD Neck: No JVD, no thyromegaly or thyroid nodule.  Lungs: Clear to auscultation bilaterally with normal respiratory effort. CV: Nondisplaced PMI.  Heart regular S1/S2, no S3/S4, 1/6 HSM apex.  1+ ankle edema.  Abdomen: Soft, nontender, no hepatosplenomegaly, no distention.  Skin: Intact without lesions or rashes.  Neurologic: Alert and oriented x 3.  Psych: Normal affect. Extremities: No clubbing or cyanosis.  HEENT: Normal.     Telemetry  ST 100s-110s (personally reviewed)  Labs    CBC Recent Labs    02/13/21  1937 02/13/21 1725 02/13/21 2206 02/13/21 2232 02/14/21 0539  WBC 17.3*  --  17.6*  --   --   HGB 9.6*   < > 9.9* 11.9* 10.2*  HCT 30.3*   < > 32.0* 35.0* 30.0*  MCV 82.3  --  82.7  --   --   PLT 143*  --  PLATELET CLUMPS NOTED ON SMEAR, COUNT APPEARS DECREASED  --   --    < > = values in this interval not displayed.   Basic Metabolic Panel Recent Labs    90/24/09 2139 02/13/21 2232 02/14/21 0520 02/14/21 0539  NA 134*   < > 135 135  K 4.7   < > 3.9 4.2   CL 97*  --  97*  --   CO2 24  --  30  --   GLUCOSE 150*  --  105*  --   BUN 18  --  16  --   CREATININE 1.17*  --  0.89  --   CALCIUM 8.1*  --  8.6*  --   MG 2.2  --  2.4  --    < > = values in this interval not displayed.   Liver Function Tests Recent Labs    02/13/21 2139  AST 58*  ALT 41  ALKPHOS 64  BILITOT 2.0*  PROT 5.4*  ALBUMIN 1.9*    No results for input(s): LIPASE, AMYLASE in the last 72 hours. Cardiac Enzymes No results for input(s): CKTOTAL, CKMB, CKMBINDEX, TROPONINI in the last 72 hours.  BNP: BNP (last 3 results) Recent Labs    02/06/21 2018  BNP 1,571.8*    ProBNP (last 3 results) No results for input(s): PROBNP in the last 8760 hours.   D-Dimer No results for input(s): DDIMER in the last 72 hours.  Hemoglobin A1C No results for input(s): HGBA1C in the last 72 hours.  Fasting Lipid Panel No results for input(s): CHOL, HDL, LDLCALC, TRIG, CHOLHDL, LDLDIRECT in the last 72 hours. Thyroid Function Tests No results for input(s): TSH, T4TOTAL, T3FREE, THYROIDAB in the last 72 hours.  Invalid input(s): FREET3   Other results:   Imaging    CARDIAC CATHETERIZATION  Result Date: 02/14/2021 1. Mildly elevated filling pressures. 2. Pulmonary venous hypertension. 3. Cardiac output ok on milrinone 0.25 4. No significant coronary disease. Nonischemic cardiomyopathy.   DG Chest Port 1 View  Result Date: 02/13/2021 CLINICAL DATA:  Acute respiratory failure EXAM: PORTABLE CHEST 1 VIEW COMPARISON:  Radiograph and CT 02/06/2021 FINDINGS: Right upper extremity PICC tip is in the mid SVC. Lung volumes are low. Right greater than left basilar consolidations with progression from prior exam. Heart size is prominent but stable. No large pleural effusion or pneumothorax. IMPRESSION: 1. Right upper extremity PICC tip in the mid SVC. 2. Right greater than left basilar consolidations with progression from prior exam, suspicious for worsening pneumonia. 3. Prominent  heart size is likely accentuated by portable technique and low lung volumes. Electronically Signed   By: Narda Rutherford M.D.   On: 02/13/2021 21:48     Medications:     Scheduled Medications:  Chlorhexidine Gluconate Cloth  6 each Topical Daily   digoxin  0.125 mg Oral Daily   insulin aspart  0-5 Units Subcutaneous QHS   insulin aspart  0-9 Units Subcutaneous TID WC   ivabradine  5 mg Oral BID WC   levothyroxine  75 mcg Oral Q0600   losartan  12.5 mg Oral Daily   mouth rinse  15  mL Mouth Rinse BID   potassium chloride  40 mEq Oral Once   sodium chloride flush  10-40 mL Intracatheter Q12H   sodium chloride flush  3 mL Intravenous Q12H   spironolactone  12.5 mg Oral Daily   torsemide  40 mg Oral Daily    Infusions:  sodium chloride Stopped (02/08/21 0434)   sodium chloride 10 mL/hr at 02/13/21 1500   sodium chloride 10 mL/hr at 02/14/21 0700   heparin 2,100 Units/hr (02/14/21 0700)   milrinone 0.125 mcg/kg/min (02/14/21 0700)   norepinephrine (LEVOPHED) Adult infusion Stopped (02/13/21 0700)    PRN Medications: sodium chloride, sodium chloride, acetaminophen, lip balm, ondansetron (ZOFRAN) IV, sodium chloride flush    Assessment/Plan   Acute systolic HF -> Cardiogenic shock/acute biventricular heart failure/new cardiomyopathy -New diagnosis of CHF this admission after presenting with hemoptysis at OSH in [place]. Suspected viral illness with > 1 month hx weakness, fatigue, cough, N/V. HIV negative. Recently diagnosed with hypothyroidism.  Etiology => viral myocarditis versus familial cardiomyopathy (mother and aunts/uncles with CHF), CAD less likely but not ruled out.  - Echo here with EF of 10-15%, mildly dilated LV, moderate to severely reduced RV fxn with mildly dilated RV, RVSP 45 mmHg, severe secondary MR, mod-severe TR. - BNP 1,571. Pregnancy test negative.  - Currently on 0.125 mcg milrinone, off NE.  RHC yesterday with CI 2.48 on milrinone 0.25, needs co-ox sent  this morning.   - CVP 5, decrease torsemide to 40 mg daily and replace K.  - BP significantly improved, stop midodrine and add losartan 12.5 daily.   - Continue dig and spiro.  - Continue ivabradine 5 mg bid.  - No beta blocker due to shock. - Will need cardiac MRI once more stable. - Overall picture concerning, she is someone we may need to consider advanced therapies for.   2. Mitral valve regurgitation - Severe on echo this admission.  - Likely secondary, valve does not look abnormal on echo.   3. Moderate-severe TR - Likely secondary  4. NSVT => episode of torsades de pointes - She had been on amiodarone gtt for NSVT, prolonged QTc and developed TdP.  Now off amiodarone and on lower dose of milrinone.   - Repeat ECG this morning for QTc.  - Replace K, Mg ok.   5. Hemoptysis/community acquired pneumonia - Possible PE noted on imaging at OSH. Suspect course of events likely viral illness/myocarditis > inactivity, followed by PE and pneumonia. Venous duplex negative for DVT. - CT chest here with consolidation both lower lobes. Ground glass opacities right middle lobe. - Was on ceftriaxone, now stopped by CCM.  - Suspect hemoptysis was due to elevated left atrial pressure.  - WBC 17.0. > 19.6> 19 > 17.9 > 17.3 > 17.6 - Hgb 9.9 (higher), no further hemoptysis. Now back on heparin for PE.   6. RLL PE - Seen on imaging at [facility] (CTA chest).  - Now back on heparin gtt.    6. Hypothyroidism -Recently diagnosed. -TSH 13.5/Free T4 1.39. -On synthroid PTA.   7. Elevated INR - Possibly due to passive congestion from CHF. - Given Vit K 6/29, 6/30, and 7/1 per CCM.  8. Hyponatremia - Resolved.  - Restrict free water  9. Acute blood loss anemia -Hgb 12.6 > 13.5 > 9.8> 9 > 10 > 9.4 > 9.6 > 9.9.  -Presented with massive hemoptysis which is now improved.  10. Thrombocytopenia - Mild but significant drop yesterday.  However, looking at trend, platelets  have had significantly  fluctuation and have been in the 140s before (prior to starting heparin).  Hopefully this is not HIT but will follow platelet trend closely. Needs CBC today, platelets were clumped.   Ambulate around.   CRITICAL CARE Performed by: Marca Ancona  Total critical care time: 40 minutes  Critical care time was exclusive of separately billable procedures and treating other patients.  Critical care was necessary to treat or prevent imminent or life-threatening deterioration.  Critical care was time spent personally by me (independent of midlevel providers or residents) on the following activities: development of treatment plan with patient and/or surrogate as well as nursing, discussions with consultants, evaluation of patient's response to treatment, examination of patient, obtaining history from patient or surrogate, ordering and performing treatments and interventions, ordering and review of laboratory studies, ordering and review of radiographic studies, pulse oximetry and re-evaluation of patient's condition.  Marca Ancona, MD  7:57 AM 02/14/2021

## 2021-02-14 NOTE — Progress Notes (Signed)
   Called by nursing staff for hgb drop to 6.7 . Previous hgb 10.7.   No obvious source of bleeding.   Repeat CBC now. Hold heparin drip.   Discussed with Dr Shirlee Latch.   Elick Aguilera Np_C  10:31 AM

## 2021-02-14 NOTE — Consult Note (Signed)
Cardiology Consultation:   Patient ID: Debra Daniel MRN: [id number]; DOB: 10/29/1985  Admit date: 02/06/2021 Date of Consult: 02/14/2021  PCP:  Patient, No Pcp Per (Inactive)   [facility] Providers Cardiologist:  DM/DB     Patient Profile:   Debra Daniel is a 35 y.o. female with a hx of hypothyroidism ( a fairly recent diagnosis) who is being seen 02/14/2021 for the evaluation of PMVT at the request of Dr. Shirlee Latch.  History of Present Illness:   Debra Daniel with no notable PMHx until of late On 05/14 she was seen in ED with recurrent nausea and vomiting X 4 days. She was tachycardic with rates in 130s. LFTs mildly elevated. She was given IV fluids and one dose of fosfomycin for possible UTI. Reports predominant symptom at the time was decreased urine output. She later followed up in [place] and states she was given nitrofurantoin and later amoxicillin when ongoing UTI was suspected. Afterwards she continued to feel weak for several weeks and continued with episodes of nausea and vomiting. She's noticed lower extremity edema for the last month. No dyspnea orthopnea or PND. No fevers or chills reported. She reports a cough with clear sputum that started about a month ago. It later resolved on its own but returned last weekend.    On June 25th, she began experiencing hemoptysis and sought ED evaluation 06/27 in [place]. CTA chest abdomen pelvis with acute RLL segmental and subsegmental PE, crazy-paving pattern of consolidation in bilateral lower lobes, diffuse anasarca, no definite pathology in abdomen and pelvis. OCP were stopped. She had worsening hemoptysis with IV heparin which was stopped. Echo demonstrated EF of 25-30%, moderate to severe MR, RVSP 45 mmHg, moderate TR. She was transferred to [facility] for additional pulmonary and cardiac evaluation.  LE venous US negative b/l  Upon arrival to [facility], she was evaluated by Cardiology and stat echo demonstrated LVEF of 10-15% with moderately  dilated LV, severe secondary MR, moderate to severe RV dysfunction with moderately enlarged RV, RVSP 45 mmHg, severe TR.   She developed cardiogenic shock and has since been placed on milrinone 0.25 mcg. She was briefly on low-dose norepinephrine which was discontinued shortly after arriving  HF team was consulted for cardiogenic shock and BiVE failure, severe MR probably secondary, unable to anticoagulate for PE with worsening hemoptysis on heparin, on abx for CAP She was tachycardic, described as MAT She developed NSVT episodes as long as 22beats and amiodarone was added to her regime 02/07/21  6/30 started on lasix gtt, hypotensive and midodrine added, NE resumed as needed Continued NSVT episodes Lytes repleted along the way   7/2 hemoptysis resolved with only old clots with cough, diuresing well 02/11/21 back on heparin gtt, feeling better, ambulating Suspect course of events likely viral illness/myocarditis > inactivity, followed by PE and pneumonia. Venous duplex negative for DVT.  7/4 off NE Planned ofr c.MRI and R/LHC when able Described as continued tachycardic, only PVCs no NSVT remained on amio Off antibiotics  7/5 LHC no CAD RA mean 9 RV 52/17 PA 53/17, mean 31 PCWP mean 16 LV 90/14 AO 90/63  Oxygen saturations: PA 57% AO 98%  Cardiac Output (Fick) 5.1  Cardiac Index (Fick) 2.48  PVR 2.9 WU  TSH 13 and synthroid resumed yesterday  02/13/21 EVENING developed PMVT and was defibrillated x2, brief CPR, QT felt to be prolonged and amio stopped Given K and mag and milrinone was reduced  TODAY BPs better, midodrine stopped,losartan added, also on ivabradine HF noted"  Overall picture concerning, she is someone we may need to consider advanced therapies for" She had another episode of this described as VF with transient CPR and shocked to SR, started on lidocaine gtt 100mg  bolus > 1mg /min Ivabradine stopped (case reports of TdP were found by the pharmacist) Hep gtt  stopped with 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 by repeat labs, planned to repeat CBC again and start  bivalirudin (patient has PE) if plts remained low  EP is asked to the case with episodes of Torsades/VF  LABS yesterday/today K+ 4.7 > 4.1 > 3.9 > 4.2 Mag 2.2 > 2.4 Creat 1.17 > 0.89 WBC 17.6 > 17.2 H/H 9.9/32 > 9.9/31 Plts 143 > clumped > 90  HS Trop this AM 53  COVID negative on arrival and yesterday  Milrinone currently at 0.125 Lidocaine at 1  Past Medical History:  Diagnosis Date   Encounter for menstrual regulation 01/24/2015   Hypothyroid    Migraines    Obesity    UTI (urinary tract infection)     Past Surgical History:  Procedure Laterality Date   broken collar bone  1994   RIGHT/LEFT HEART CATH AND CORONARY ANGIOGRAPHY N/A 02/13/2021   Procedure: RIGHT/LEFT HEART CATH AND CORONARY ANGIOGRAPHY;  Surgeon: 01/26/2015, MD;  Location: [facility] INVASIVE CV LAB;  Service: Cardiovascular;  Laterality: N/A;     Home Medications:  Prior to Admission medications   Medication Sig Start Date End Date Taking? Authorizing Provider  acetaminophen (TYLENOL) 500 MG tablet Take 1,000 mg by mouth every 6 (six) hours as needed for mild pain.   Yes [provider]  levothyroxine (SYNTHROID) 75 MCG tablet Take 75 mcg by mouth daily. 01/19/21  Yes [provider]  ondansetron (ZOFRAN) 4 MG tablet Take 4 mg by mouth every 8 (eight) hours as needed for nausea/vomiting. 12/20/20  Yes [provider]  promethazine (PHENERGAN) 25 MG tablet Take 1 tablet (25 mg total) by mouth every 6 (six) hours as needed for nausea or vomiting. 12/23/20  Yes 02/19/21, DO  amoxicillin-clavulanate (AUGMENTIN) 875-125 MG tablet Take 1 tablet by mouth 2 (two) times daily. For 7 days Patient not taking: Reported on 02/07/2021 01/05/21   [provider]  DAYSEE 0.15-0.03 &0.01 MG tablet TAKE 1 TABLET BY MOUTH ONCE DAILY. Patient not taking: Reported on 02/07/2021 01/16/21   02/09/2021, NP   nitrofurantoin, macrocrystal-monohydrate, (MACROBID) 100 MG capsule Take 100 mg by mouth 2 (two) times daily. For 7 days Patient not taking: Reported on 02/07/2021 12/26/20   [provider]    Inpatient Medications: Scheduled Meds:  Chlorhexidine Gluconate Cloth  6 each Topical Daily   digoxin  0.125 mg Oral Daily   insulin aspart  0-5 Units Subcutaneous QHS   insulin aspart  0-9 Units Subcutaneous TID WC   levothyroxine  75 mcg Oral Q0600   losartan  12.5 mg Oral Daily   mouth rinse  15 mL Mouth Rinse BID   sodium chloride flush  10-40 mL Intracatheter Q12H   sodium chloride flush  3 mL Intravenous Q12H   spironolactone  12.5 mg Oral Daily   torsemide  40 mg Oral Daily   Continuous Infusions:  sodium chloride Stopped (02/08/21 0434)   sodium chloride 10 mL/hr at 02/13/21 1500   sodium chloride Stopped (02/14/21 0759)   lidocaine 1 mg/min (02/14/21 1053)   milrinone 0.125 mcg/kg/min (02/14/21 0800)   PRN Meds: sodium chloride, sodium chloride, acetaminophen, lip balm, sodium chloride flush  Allergies:  Allergies  Allergen Reactions   Sulfa Antibiotics Hives   Ibuprofen Other (See Comments)    Constipation Can tolerate w milk of mag    Social History:   Social History   Socioeconomic History   Marital status: Single    Spouse name: Not on file   Number of children: Not on file   Years of education: Not on file   Highest education level: Not on file  Occupational History   Not on file  Tobacco Use   Smoking status: Never   Smokeless tobacco: Never  Vaping Use   Vaping Use: Never used  Substance and Sexual Activity   Alcohol use: No   Drug use: No   Sexual activity: Never    Birth control/protection: Pill  Other Topics Concern   Not on file  Social History Narrative   Not on file   Social Determinants of Health   Financial Resource Strain: Medium Risk   Difficulty of Paying Living Expenses: Somewhat hard  Food Insecurity: No Food Insecurity    Worried About Programme researcher, broadcasting/film/video in the Last Year: Never true   Ran Out of Food in the Last Year: Never true  Transportation Needs: No Transportation Needs   Lack of Transportation (Medical): No   Lack of Transportation (Non-Medical): No  Physical Activity: Insufficiently Active   Days of Exercise per Week: 2 days   Minutes of Exercise per Session: 30 min  Stress: No Stress Concern Present   Feeling of Stress : Not at all  Social Connections: Socially Isolated   Frequency of Communication with Friends and Family: More than three times a week   Frequency of Social Gatherings with Friends and Family: More than three times a week   Attends Religious Services: Never   Database administrator or Organizations: No   Attends Engineer, structural: Never   Marital Status: Never married  Catering manager Violence: Not At Risk   Fear of Current or Ex-Partner: No   Emotionally Abused: No   Physically Abused: No   Sexually Abused: No    Family History:   Family History  Problem Relation Age of Onset   Hypertension Paternal Grandmother    Coronary artery disease Paternal Grandmother    Hypertension Maternal Grandmother    Diabetes Maternal Grandmother    Hyperlipidemia Maternal Grandmother    Thyroid disease Maternal Grandmother    Kidney disease Maternal Grandmother    Diabetes Father    Hypertension Mother    Hyperlipidemia Mother    COPD Paternal Grandfather    Heart disease Maternal Grandfather    Diabetes Maternal Grandfather    Hypertension Brother    Hyperlipidemia Brother      ROS:  Please see the history of present illness.  All other ROS reviewed and negative.     Physical Exam/Data:   Vitals:   02/14/21 0800 02/14/21 0900 02/14/21 1000 02/14/21 1100  BP: 128/83 115/78 129/89 128/73  Pulse: 91 88 84 96  Resp: (!) 25 (!) 27 (!) 36 18  Temp: (!) 97.5 F (36.4 C)     TempSrc: Oral     SpO2: 100% 100% 100% 100%  Weight:      Height:         Intake/Output Summary (Last 24 hours) at 02/14/2021 1146 Last data filed at 02/14/2021 0800 Gross per 24 hour  Intake 760.77 ml  Output 2150 ml  Net -1389.23 ml   Last 3 Weights 02/14/2021 02/13/2021 02/12/2021  Weight (  lbs) 216 lb 0.8 oz 214 lb 8.1 oz 217 lb 2.5 oz  Weight (kg) 98 kg 97.3 kg 98.5 kg     Body mass index is 36.51 kg/m.  General:  Well nourished, well developed, in no acute distress HEENT: normal Lymph: no adenopathy Neck: no JVD Endocrine:  No thryomegaly Vascular: No carotid bruits; FA pulses 2+ bilaterally without bruits  Cardiac:  normal S1, S2; RRR; no murmur  Lungs:  clear to auscultation bilaterally, no wheezing, rhonchi; scattered rales. Abd: soft, nontender, no hepatomegaly  Ext: no edema Musculoskeletal:  No deformities, BUE and BLE strength normal and equal Skin: warm and dry  Neuro:  CNs 2-12 intact, no focal abnormalities noted Psych:  Normal affect   EKG:  The EKG was personally reviewed and demonstrates:    #1 ST 128bpm, QTc 455ms (by machine read, HR is fast and difficult to tell) #2 02/13/21: SR 99bpm, QTc 438ms (though QT is >1/2 RR) Today SR92bpm, QTc 460ms, PVCs (QT low amplitude and hard to establish)   Telemetry:  Telemetry was personally reviewed and demonstrates:   SR generally in the 80's Last night she had episodes of nonsustained MM and PMVT preceding her episode, this is less clear with some artifact but I do think she had a PVC on T > Torsdaes Today again just prior to the an uptic in ectopy and NSVT, > V bigemeny > torsades   Relevant CV Studies:  CARDIAC CATHETERIZATION Result Date: 02/14/2021 1. Mildly elevated filling pressures. 2. Pulmonary venous hypertension. 3. Cardiac output ok on milrinone 0.25 4. No significant coronary disease. Nonischemic cardiomyopathy.   02/06/21: TTE IMPRESSIONS   1. Left ventricular ejection fraction, by estimation, is 10-15%. The left  ventricle has severely decreased function. The left ventricle  demonstrates  global hypokinesis. The left ventricular internal cavity size was  moderately dilated. Left ventricular  diastolic function could not be evaluated.   2. Right ventricular systolic function is moderate-severely reduced. The  right ventricular size is moderately enlarged. There is moderately  elevated pulmonary artery systolic pressure. The estimated right  ventricular systolic pressure is 45.9 mmHg.   3. Left atrial size was mildly dilated.   4. The mitral valve is grossly normal. Severe, secondary mitral valve  regurgitation with leaflet tenting secondary to LV dysfunction. No  evidence of mitral stenosis.   5. Tricuspid valve regurgitation is severe.   6. The aortic valve is normal in structure. Aortic valve regurgitation is  not visualized.   7. The inferior vena cava is normal in size with <50% respiratory  variability, suggesting right atrial pressure of 8 mmHg.     Laboratory Data:  High Sensitivity Troponin:   Recent Labs  Lab 02/06/21 2008 02/07/21 0226 02/13/21 2139 02/14/21 0520  TROPONINIHS 26* 24* 38* 53*     Chemistry Recent Labs  Lab 02/13/21 0455 02/13/21 1725 02/13/21 2139 02/13/21 2232 02/14/21 0520 02/14/21 0539  NA 135   < > 134* 136 135 135  K 4.2   < > 4.7 4.1 3.9 4.2  CL 97*  --  97*  --  97*  --   CO2 31  --  24  --  30  --   GLUCOSE 103*  --  150*  --  105*  --   BUN 17  --  18  --  16  --   CREATININE 0.91  --  1.17*  --  0.89  --   CALCIUM 7.8*  --  8.1*  --  8.6*  --   GFRNONAA >60  --  >60  --  >60  --   ANIONGAP 7  --  13  --  8  --    < > = values in this interval not displayed.    Recent Labs  Lab 02/09/21 0314 02/13/21 2139  PROT 4.6* 5.4*  ALBUMIN 1.8* 1.9*  AST 28 58*  ALT 35 41  ALKPHOS 48 64  BILITOT 2.4* 2.0*   Hematology Recent Labs  Lab 02/13/21 2206 02/13/21 2232 02/14/21 0539 02/14/21 0805 02/14/21 1002  WBC 17.6*  --   --  10.7* 17.2*  RBC 3.87  --   --  2.55* 3.81*  HGB 9.9*   < > 10.2*  6.7* 9.9*  HCT 32.0*   < > 30.0* 21.5* 31.1*  MCV 82.7  --   --  84.3 81.6  MCH 25.6*  --   --  26.3 26.0  MCHC 30.9  --   --  31.2 31.8  RDW 17.6*  --   --  17.7* 17.7*  PLT PLATELET CLUMPS NOTED ON SMEAR, COUNT APPEARS DECREASED  --   --  64* 90*   < > = values in this interval not displayed.   BNPNo results for input(s): BNP, PROBNP in the last 168 hours.  DDimer No results for input(s): DDIMER in the last 168 hours.   Radiology/Studies:  CARDIAC CATHETERIZATION  Result Date: 02/14/2021 1. Mildly elevated filling pressures. 2. Pulmonary venous hypertension. 3. Cardiac output ok on milrinone 0.25 4. No significant coronary disease. Nonischemic cardiomyopathy.   DG Chest Port 1 View  Result Date: 02/13/2021 CLINICAL DATA:  Acute respiratory failure EXAM: PORTABLE CHEST 1 VIEW COMPARISON:  Radiograph and CT 02/06/2021 FINDINGS: Right upper extremity PICC tip is in the mid SVC. Lung volumes are low. Right greater than left basilar consolidations with progression from prior exam. Heart size is prominent but stable. No large pleural effusion or pneumothorax. IMPRESSION: 1. Right upper extremity PICC tip in the mid SVC. 2. Right greater than left basilar consolidations with progression from prior exam, suspicious for worsening pneumonia. 3. Prominent heart size is likely accentuated by portable technique and low lung volumes. Electronically Signed   By: Narda Rutherford M.D.   On: 02/13/2021 21:48   [place] EKG SITE RITE  Result Date: 02/11/2021 If Site Rite image not attached, placement could not be confirmed due to current cardiac rhythm.    Assessment and Plan:   New (presumably) and severe BiVe failure Cardiogenic shock On milrinone Off NE a couple days antibiotics stopped 7/3 Off proamatine (stopped  today) Ivabrdine stopped today (started yesterday) with some case studies of pro arrhythmia  Reviewed case with Dr. Ladona Ridgel, he has seen and examined the patient, EKGs and telemetry Not  felt to be long/short triggered arrhythmia and most likelly 2/2 sever LV dysfunction and acute HF, illness. Agreed QT did appear to get long Agreed to stop Ivabradine with perhaps a correlation between start of drug and VF  His full thoughts to follow    Risk Assessment/Risk Scores:    For questions or updates, please contact [facility] Please consult [email or web address] for contact info under   Signed, Sheilah Pigeon, PA-C  02/14/2021 11:46 AM   EP Attending  Patient seen and examined. Agree with above. The patient has an unusual history and I agree with the assessment. She was placed on corleanor and amiodarone and has had 2 episodes of PMVT in the setting of severe LV  dysfunction requiring pressors. Her QT during the episodes was long. QTC was almost 600 at time of PMVT requiring shock. Her exam demonstrates an awake well appearing young woman, NAD. Lungs revealed minimal basilar rales and extremities are warm and bowel sounds are present. Neuro is nonfocal althought she appears a bit anxious. Tele demonstrates NSR.  A/P PMVT - this is not strictly speaking torsades but neverthe less PMVT. I suspect that the combination of corleanor, amiodarone, positive ionotropes and shock are behind her spells. Avoid phenrgen. IV lidocaine is reasonable but watch for toxicity. Might help. Avoid all QT prolonging meds and keep potassium close to 5 and Mg over 2. I'd probably stop the lidocaine absent more PMVT in 2 days. If/when her pressures improve, I would try to add a low dose of beta blocker, either toprol or coreg. Monomorphic VT - she has had only NSVT. I would continue lidocaine.  Pulmonary embolism - treat with blood thinner when bleeding allows.   Sharlot Gowda Mathews Stuhr,MD

## 2021-02-14 NOTE — Progress Notes (Signed)
Patient ID: Marrissa Dai, female   DOB: 1986-06-18, 35 y.o.   MRN: [id number]  Called by CCM with episode of VF, transient CPR and immediately shocked back to NSR.    Patient is awake and alert.  No complaints currently.   We will start her on lidocaine gtt, amiodarone was stopped last night due to TdP and long QTc.   We will stop ivabradine, case reports of TdP were found by the pharmacist.  Co-ox 52%, will continue milrinone 0.125 for now.   I will ask EP to consult. K and Mg have been replaced.   Platelets 90K on repeat CBC with stalbe hgb, concern for HIT.  Will send HIT level and stop heparin gtt, and given concern for platelets clumping will send 1 more CBC citrated.  If plts are low, will start bivalirudin (patient has PE).   Marca Ancona 02/14/2021 11:16 AM

## 2021-02-14 NOTE — Progress Notes (Signed)
Attending:    Subjective: Had heart catheterization yesterday Overnight had cardiac arrest in setting of Torsades episode, required CPR, brief CPR, defibrillation COOX down slightly today Amiodarone held yesterday  UOP adequate Hgb down significantly this morning  Objective: Vitals:   02/14/21 0600 02/14/21 0700 02/14/21 0800 02/14/21 0900  BP: 118/80 126/74 128/83 115/78  Pulse: 91 86 91 88  Resp: 17 (!) 22 (!) 25 (!) 27  Temp:   (!) 97.5 F (36.4 C)   TempSrc:   Oral   SpO2: 98% 100% 100% 100%  Weight:      Height:          Intake/Output Summary (Last 24 hours) at 02/14/2021 1029 Last data filed at 02/14/2021 0800 Gross per 24 hour  Intake 816.27 ml  Output 2150 ml  Net -1333.73 ml    General:  Chronically ill appearing, obese, resting comfortably in bed HENT: NCAT OP clear PULM: CTA B, normal effort CV: RRR, no mgr GI: BS+, soft, nontender MSK: normal bulk and tone Neuro: awake, alert, no distress, MAEW    CBC    Component Value Date/Time   WBC 10.7 (H) 02/14/2021 0805   RBC 2.55 (L) 02/14/2021 0805   HGB 6.7 (LL) 02/14/2021 0805   HCT 21.5 (L) 02/14/2021 0805   PLT 64 (L) 02/14/2021 0805   MCV 84.3 02/14/2021 0805   MCH 26.3 02/14/2021 0805   MCHC 31.2 02/14/2021 0805   RDW 17.7 (H) 02/14/2021 0805   LYMPHSABS 1.8 02/08/2021 0334   MONOABS 1.2 (H) 02/08/2021 0334   EOSABS 0.0 02/08/2021 0334   BASOSABS 0.0 02/08/2021 0334    BMET    Component Value Date/Time   NA 135 02/14/2021 0539   K 4.2 02/14/2021 0539   CL 97 (L) 02/14/2021 0520   CO2 30 02/14/2021 0520   GLUCOSE 105 (H) 02/14/2021 0520   BUN 16 02/14/2021 0520   CREATININE 0.89 02/14/2021 0520   CALCIUM 8.6 (L) 02/14/2021 0520   GFRNONAA >60 02/14/2021 0520      LHC without blockages RHC   RA mean 9 RV 52/17 PA 53/17, mean 31 PCWP mean 16 LV 90/14 AO 90/63  Oxygen  saturations: PA 57% AO 98%  Cardiac Output (Fick) 5.1  Cardiac Index (Fick) 2.48  PVR 2.9 WU  Impression/Plan: Acute  blood loss anemia> unclear cause, is this spurious lab result? Stat   epeat pending; if the Hgb is still low on repeat then will need CT to look for RP bleed  VT/Torsades > having runs of VT again this morning, arrest last night; monitor telemetry, holding amiodarone per cardiology yestrday, will discuss anti-arrhythmic therapy with them today; follow electrolytes closely Cardiogenic shock> dig and spironolactone to continue today, continue torsemide, adding losaran, milrinone to continue; ivabradine to continue Hemoptysis> resolved PE> holding heparin for now given drop in Hgb  My cc time 37 minutes  Heber [place], MD [facility] Pager: [phone] Cell: [phone] After 3pm or if no response, call [phone]

## 2021-02-14 NOTE — Progress Notes (Addendum)
ANTICOAGULATION CONSULT NOTE  Pharmacy Consult for heparin to bivalirudin Indication: pulmonary embolus  Allergies  Allergen Reactions   Sulfa Antibiotics Hives   Ibuprofen Other (See Comments)    Constipation Can tolerate w milk of mag    Patient Measurements: Height: 5' 4.5" (163.8 cm) Weight: 98 kg (216 lb 0.8 oz) IBW/kg (Calculated) : 55.85 Heparin Dosing Weight: ~ 80 kg  Vital Signs: Temp: 97.5 F (36.4 C) (07/06 0800) Temp Source: Oral (07/06 0800) BP: 120/73 (07/06 1200) Pulse Rate: 89 (07/06 1200)  Labs: Recent Labs    02/12/21 1753 02/13/21 0400 02/13/21 0455 02/13/21 1351 02/13/21 1725 02/13/21 2139 02/13/21 2206 02/13/21 2232 02/14/21 0520 02/14/21 0539 02/14/21 0805 02/14/21 1002 02/14/21 1113  HGB  --   --    < >  --    < >  --  9.9*   < >  --  10.2* 6.7* 9.9*  --   HCT  --   --    < >  --    < >  --  32.0*   < >  --  30.0* 21.5* 31.1*  --   PLT  --   --    < >  --   --   --  PLATELET CLUMPS NOTED ON SMEAR, COUNT APPEARS DECREASED  --   --   --  64* 90*  --   HEPARINUNFRC <0.10* 0.11*  --  0.22*  --   --   --   --   --   --   --   --   --   CREATININE  --   --    < >  --   --  1.17*  --   --  0.89  --   --   --  0.98  TROPONINIHS  --   --   --   --   --  38*  --   --  53*  --   --   --   --    < > = values in this interval not displayed.     Estimated Creatinine Clearance: 93.7 mL/min (by C-G formula based on SCr of 0.98 mg/dL).  Assessment: 35 yo female with new PE for heparin. No AC PTA. Pt had significant hemoptysis initially so heparin level goal is lower.  Pt with declining platelet count and concern for HIT. Repeat pltc is 93kg, HIT Ab sent. Will hold off further heparin for now with abrupt decline today in pltc and start bivalirudin. Will target lower end of range of aPTT goal given prior bleeding concerns.  Goal of Therapy:  aPTT 50-70 seconds Monitor platelets by anticoagulation protocol: Yes   Plan:  Start bivalirudin 0.12  mg/kg/h Check aPTT in 4h F/U HIT Ab   Fredonia Highland, PharmD, BCPS, [facility] Clinical Pharmacist [phone] Please check AMION for all [facility] numbers 02/14/2021

## 2021-02-14 NOTE — Progress Notes (Addendum)
Pt in torsades and conscious, 1 defibrillation administered and pt back in sinus tach. No CPR. Lidocaine gtt ordered. Will continue to monitor.

## 2021-02-14 NOTE — Progress Notes (Signed)
ANTICOAGULATION CONSULT NOTE  Pharmacy Consult for heparin to bivalirudin Indication: pulmonary embolus  Allergies  Allergen Reactions   Heparin Anaphylaxis    Hit antibody sent 02/14/21   Sulfa Antibiotics Hives   Ibuprofen Other (See Comments)    Constipation Can tolerate w milk of mag    Patient Measurements: Height: 5' 4.5" (163.8 cm) Weight: 98 kg (216 lb 0.8 oz) IBW/kg (Calculated) : 55.85 Heparin Dosing Weight: ~ 80 kg  Vital Signs: Temp: 98.1 F (36.7 C) (07/06 1628) BP: 92/65 (07/06 2100) Pulse Rate: 89 (07/06 2100)  Labs: Recent Labs    02/12/21 1753 02/13/21 0400 02/13/21 0455 02/13/21 1351 02/13/21 1725 02/13/21 2139 02/13/21 2206 02/14/21 0520 02/14/21 0539 02/14/21 0805 02/14/21 1002 02/14/21 1113 02/14/21 1332 02/14/21 2100  HGB  --   --    < >  --    < >  --    < >  --  10.2* 6.7* 9.9*  --   --   --   HCT  --   --    < >  --    < >  --    < >  --  30.0* 21.5* 31.1*  --   --   --   PLT  --   --    < >  --   --   --    < >  --   --  64* 90*  --  93*  --   APTT  --   --   --   --   --   --   --   --   --   --   --   --   --  70*  HEPARINUNFRC <0.10* 0.11*  --  0.22*  --   --   --   --   --   --   --   --   --   --   CREATININE  --   --    < >  --   --  1.17*  --  0.89  --   --   --  0.98  --   --   TROPONINIHS  --   --   --   --   --  38*  --  53*  --   --   --   --   --   --    < > = values in this interval not displayed.     Estimated Creatinine Clearance: 93.7 mL/min (by C-G formula based on SCr of 0.98 mg/dL).  Assessment: 35 yo female with new PE for heparin. No AC PTA. Pt had significant hemoptysis initially so heparin level goal is lower.  Pt with declining platelet count and concern for HIT. Repeat pltc is 93kg, HIT Ab sent. Will hold off further heparin for now with abrupt decline today in pltc and start bivalirudin. Will target lower end of range of aPTT goal given prior bleeding concerns.  Initial aptt is 70s at upper end of goal.  Nursing noted continued mild hemoptysis this afternoon. Will drop bival rate slightly to keep midrange.   Goal of Therapy:  aPTT 50-70 seconds Monitor platelets by anticoagulation protocol: Yes   Plan:  Reduce bivalirudin to 0.1 mg/kg/h Check aPTT in am F/U HIT Ab  Sheppard Coil PharmD., BCPS Clinical Pharmacist 02/14/2021 10:33 PM

## 2021-02-15 ENCOUNTER — Inpatient Hospital Stay (HOSPITAL_COMMUNITY): Payer: Medicaid Other

## 2021-02-15 DIAGNOSIS — I5021 Acute systolic (congestive) heart failure: Secondary | ICD-10-CM

## 2021-02-15 LAB — CALCIUM, IONIZED
Calcium, Ionized, Serum: 7.2 mg/dL — ABNORMAL HIGH (ref 4.5–5.6)
Calcium, Ionized, Serum: 4.5 mg/dL (ref 4.5–5.6)

## 2021-02-15 LAB — BASIC METABOLIC PANEL
Anion gap: 7 (ref 5–15)
Anion gap: 8 (ref 5–15)
BUN: 17 mg/dL (ref 6–20)
BUN: 15 mg/dL (ref 6–20)
CO2: 30 mmol/L (ref 22–32)
CO2: 28 mmol/L (ref 22–32)
Calcium: 8.2 mg/dL — ABNORMAL LOW (ref 8.9–10.3)
Calcium: 7.9 mg/dL — ABNORMAL LOW (ref 8.9–10.3)
Chloride: 98 mmol/L (ref 98–111)
Chloride: 97 mmol/L — ABNORMAL LOW (ref 98–111)
Creatinine, Ser: 1.01 mg/dL — ABNORMAL HIGH (ref 0.44–1.00)
Creatinine, Ser: 0.92 mg/dL (ref 0.44–1.00)
GFR, Estimated: >60 mL/min (ref >60)
GFR, Estimated: >60 mL/min (ref >60)
Glucose, Bld: 110 mg/dL — ABNORMAL HIGH (ref 70–99)
Glucose, Bld: 146 mg/dL — ABNORMAL HIGH (ref 70–99)
Potassium: 4.4 mmol/L (ref 3.5–5.1)
Potassium: 3.8 mmol/L (ref 3.5–5.1)
Sodium: 135 mmol/L (ref 135–145)
Sodium: 133 mmol/L — ABNORMAL LOW (ref 135–145)

## 2021-02-15 LAB — COOXEMETRY PANEL
Carboxyhemoglobin: 1.4 % (ref 0.5–1.5)
Methemoglobin: 0.9 % (ref 0.0–1.5)
O2 Saturation: 50 %
Total hemoglobin: 9.8 g/dL — ABNORMAL LOW (ref 12.0–16.0)

## 2021-02-15 LAB — CBC
HCT: 30.2 % — ABNORMAL LOW (ref 36.0–46.0)
Hemoglobin: 9.5 g/dL — ABNORMAL LOW (ref 12.0–15.0)
MCH: 25.7 pg — ABNORMAL LOW (ref 26.0–34.0)
MCHC: 31.5 g/dL (ref 30.0–36.0)
MCV: 81.8 fL (ref 80.0–100.0)
Platelets: 121 10*3/uL — ABNORMAL LOW (ref 150–400)
RBC: 3.69 MIL/uL — ABNORMAL LOW (ref 3.87–5.11)
RDW: 17.9 % — ABNORMAL HIGH (ref 11.5–15.5)
WBC: 17.5 10*3/uL — ABNORMAL HIGH (ref 4.0–10.5)
nRBC: 0.2 % (ref 0.0–0.2)

## 2021-02-15 LAB — GLUCOSE, CAPILLARY
Glucose-Capillary: 99 mg/dL (ref 70–99)
Glucose-Capillary: 109 mg/dL — ABNORMAL HIGH (ref 70–99)
Glucose-Capillary: 100 mg/dL — ABNORMAL HIGH (ref 70–99)
Glucose-Capillary: 108 mg/dL — ABNORMAL HIGH (ref 70–99)
Glucose-Capillary: 107 mg/dL — ABNORMAL HIGH (ref 70–99)
Glucose-Capillary: 112 mg/dL — ABNORMAL HIGH (ref 70–99)
Glucose-Capillary: 96 mg/dL (ref 70–99)

## 2021-02-15 LAB — APTT
aPTT: 136 seconds — ABNORMAL HIGH (ref 24–36)
aPTT: 56 seconds — ABNORMAL HIGH (ref 24–36)
aPTT: 54 seconds — ABNORMAL HIGH (ref 24–36)

## 2021-02-15 LAB — MAGNESIUM
Magnesium: 1.9 mg/dL (ref 1.7–2.4)
Magnesium: 2 mg/dL (ref 1.7–2.4)

## 2021-02-15 LAB — HEPARIN INDUCED PLATELET AB (HIT ANTIBODY): Heparin Induced Plt Ab: 2.313 OD — ABNORMAL HIGH (ref 0.000–0.400)

## 2021-02-15 LAB — LIDOCAINE LEVEL: Lidocaine Lvl: 5 ug/mL (ref 1.5–5.0)

## 2021-02-15 LAB — DIGOXIN LEVEL: Digoxin Level: 0.4 ng/mL — ABNORMAL LOW (ref 0.8–2.0)

## 2021-02-15 MED ORDER — TORSEMIDE 20 MG PO TABS
40.0000 mg | ORAL_TABLET | Freq: Two times a day (BID) | ORAL | Status: DC
  Administered 2021-02-15 – 2021-02-18 (×8): 40 mg via ORAL
  Filled 2021-02-15 (×8): qty 2

## 2021-02-15 MED ORDER — MAGNESIUM SULFATE 2 GM/50ML IV SOLN: 2.0000 g | Freq: Once | INTRAVENOUS | Status: DC

## 2021-02-15 MED ORDER — SPIRONOLACTONE 25 MG PO TABS
25.0000 mg | ORAL_TABLET | Freq: Every day | ORAL | Status: DC
  Administered 2021-02-15 – 2021-02-22 (×8): 25 mg via ORAL
  Filled 2021-02-15 (×8): qty 1

## 2021-02-15 MED ORDER — GADOBUTROL 1 MMOL/ML IV SOLN
10.0000 mL | Freq: Once | INTRAVENOUS | Status: AC | PRN
  Administered 2021-02-15: 10 mL via INTRAVENOUS

## 2021-02-15 MED ORDER — POTASSIUM CHLORIDE CRYS ER 20 MEQ PO TBCR
40.0000 meq | EXTENDED_RELEASE_TABLET | Freq: Once | ORAL | Status: AC
  Administered 2021-02-15: 40 meq via ORAL
  Filled 2021-02-15: qty 2

## 2021-02-15 MED ORDER — MAGNESIUM SULFATE 2 GM/50ML IV SOLN
2.0000 g | Freq: Once | INTRAVENOUS | Status: AC
  Administered 2021-02-15: 2 g via INTRAVENOUS
  Filled 2021-02-15: qty 50

## 2021-02-15 NOTE — Progress Notes (Addendum)
Progress Note  Patient Name: Debra Daniel Date of Encounter: 02/15/2021  [facility] Cardiologist: new to [facility]  Subjective   "Im ok", not SOB at rest  Inpatient Medications    Scheduled Meds:  Chlorhexidine Gluconate Cloth  6 each Topical Daily   digoxin  0.125 mg Oral Daily   insulin aspart  0-5 Units Subcutaneous QHS   insulin aspart  0-9 Units Subcutaneous TID WC   levothyroxine  75 mcg Oral Q0600   losartan  12.5 mg Oral Daily   mouth rinse  15 mL Mouth Rinse BID   sodium chloride flush  10-40 mL Intracatheter Q12H   sodium chloride flush  3 mL Intravenous Q12H   spironolactone  12.5 mg Oral Daily   torsemide  40 mg Oral Daily   Continuous Infusions:  sodium chloride 250 mL (02/15/21 0539)   sodium chloride 10 mL/hr at 02/13/21 1500   sodium chloride Stopped (02/14/21 1329)   bivalirudin (ANGIOMAX) infusion 0.5 mg/mL (Non-ACS indications) 0.1 mg/kg/hr (02/15/21 0700)   lidocaine 1 mg/min (02/15/21 0700)   milrinone 0.125 mcg/kg/min (02/15/21 0700)   PRN Meds: sodium chloride, sodium chloride, acetaminophen, lip balm, sodium chloride flush   Vital Signs    Vitals:   02/15/21 0400 02/15/21 0500 02/15/21 0600 02/15/21 0700  BP: 94/73 91/69 97/66  100/74  Pulse: 95 92 96 99  Resp: (!) 27 (!) 30 (!) 32 19  Temp: (!) 96.9 F (36.1 C)     TempSrc: Axillary     SpO2: 100% 100% 97% 99%  Weight:  98.1 kg    Height:        Intake/Output Summary (Last 24 hours) at 02/15/2021 0735 Last data filed at 02/15/2021 0700 Gross per 24 hour  Intake 1136.27 ml  Output 1050 ml  Net 86.27 ml   Last 3 Weights 02/15/2021 02/14/2021 02/13/2021  Weight (lbs) 216 lb 4.3 oz 216 lb 0.8 oz 214 lb 8.1 oz  Weight (kg) 98.1 kg 98 kg 97.3 kg      Telemetry    SR/ST 90's-100's, NSVT she has 2 morphologies it seems with her PVCs and NSVT though her NSVT episodes are MM now and infrequent - Personally Reviewed  ECG    No new EKGs - Personally Reviewed  Physical Exam   GEN: No acute  distress.   Neck: CVP 10 Cardiac: RRR, no murmurs, rubs, or gallops.  Respiratory: CTA b/l. GI: Soft, nontender, non-distended  MS: + edema b/l ankles; No deformity. Neuro:  Nonfocal  Psych: Normal affect   Labs    High Sensitivity Troponin:   Recent Labs  Lab 02/06/21 2008 02/07/21 0226 02/13/21 2139 02/14/21 0520  TROPONINIHS 26* 24* 38* 53*      Chemistry Recent Labs  Lab 02/09/21 0314 02/10/21 0440 02/13/21 2139 02/13/21 2232 02/14/21 0520 02/14/21 0539 02/14/21 1113 02/15/21 0427  NA 130*   < > 134*   < > 135 135 132* 135  K 4.2   < > 4.7   < > 3.9 4.2 4.3 4.4  CL 96*   < > 97*  --  97*  --  94* 98  CO2 22   < > 24  --  30  --  28 30  GLUCOSE 256*   < > 150*  --  105*  --  159* 110*  BUN 21*   < > 18  --  16  --  16 17  CREATININE 1.27*   < > 1.17*  --  0.89  --  0.98 1.01*  CALCIUM 7.5*   < > 8.1*  --  8.6*  --  8.2* 8.2*  PROT 4.6*  --  5.4*  --   --   --   --   --   ALBUMIN 1.8*  --  1.9*  --   --   --   --   --   AST 28  --  58*  --   --   --   --   --   ALT 35  --  41  --   --   --   --   --   ALKPHOS 48  --  64  --   --   --   --   --   BILITOT 2.4*  --  2.0*  --   --   --   --   --   GFRNONAA 57*   < > >60  --  >60  --  >60 >60  ANIONGAP 12   < > 13  --  8  --  10 7   < > = values in this interval not displayed.     Hematology Recent Labs  Lab 02/14/21 0805 02/14/21 1002 02/14/21 1332 02/15/21 0427  WBC 10.7* 17.2*  --  17.5*  RBC 2.55* 3.81*  --  3.69*  HGB 6.7* 9.9*  --  9.5*  HCT 21.5* 31.1*  --  30.2*  MCV 84.3 81.6  --  81.8  MCH 26.3 26.0  --  25.7*  MCHC 31.2 31.8  --  31.5  RDW 17.7* 17.7*  --  17.9*  PLT 64* 90* 93* 121*    BNPNo results for input(s): BNP, PROBNP in the last 168 hours.   DDimer No results for input(s): DDIMER in the last 168 hours.   Radiology      Cardiac Studies   CARDIAC CATHETERIZATION Result Date: 02/14/2021 1. Mildly elevated filling pressures. 2. Pulmonary venous hypertension. 3. Cardiac output  ok on milrinone 0.25 4. No significant coronary disease. Nonischemic cardiomyopathy.     02/06/21: TTE IMPRESSIONS   1. Left ventricular ejection fraction, by estimation, is 10-15%. The left  ventricle has severely decreased function. The left ventricle demonstrates  global hypokinesis. The left ventricular internal cavity size was  moderately dilated. Left ventricular  diastolic function could not be evaluated.   2. Right ventricular systolic function is moderate-severely reduced. The  right ventricular size is moderately enlarged. There is moderately  elevated pulmonary artery systolic pressure. The estimated right  ventricular systolic pressure is 45.9 mmHg.   3. Left atrial size was mildly dilated.   4. The mitral valve is grossly normal. Severe, secondary mitral valve  regurgitation with leaflet tenting secondary to LV dysfunction. No  evidence of mitral stenosis.   5. Tricuspid valve regurgitation is severe.   6. The aortic valve is normal in structure. Aortic valve regurgitation is  not visualized.   7. The inferior vena cava is normal in size with <50% respiratory  variability, suggesting right atrial pressure of 8 mmHg.    Patient Profile     35 y.o. female  with a hx of hypothyroidism ( a fairly recent diagnosis) only with a month of malaise, fatigue, suspetc/treated for UTI (with a few rounds of abx in may), subsequently worsening SOB, edema, worsening exertional capacity > hemoptysis > ER in [place] found with PE....  On June 25th, she began experiencing hemoptysis and sought ED evaluation 06/27 in [place]. CTA chest abdomen  pelvis with acute RLL segmental and subsegmental PE, crazy-paving pattern of consolidation in bilateral lower lobes, diffuse anasarca, no definite pathology in abdomen and pelvis. OCP were stopped. She had worsening hemoptysis with IV heparin which was stopped. Echo demonstrated EF of 25-30%, moderate to severe MR, RVSP 45 mmHg, moderate TR. She was  transferred to [facility] for additional pulmonary and cardiac evaluation.   LE venous US negative b/l   Upon arrival to [facility], she was evaluated by Cardiology and stat echo demonstrated LVEF of 10-15% with moderately dilated LV, severe secondary MR, moderate to severe RV dysfunction with moderately enlarged RV, RVSP 45 mmHg, severe TR.   She developed cardiogenic shock and has since been placed on milrinone 0.25 mcg. She was briefly on low-dose norepinephrine which was discontinued shortly after arriving   HF team was consulted for cardiogenic shock and BiVE failure, severe MR probably secondary, unable to anticoagulate for PE with worsening hemoptysis on heparin, on abx for CAP She was tachycardic, described as MAT She developed NSVT episodes as long as 22beats and amiodarone was added to her regime 02/07/21   6/30 started on lasix gtt, hypotensive and midodrine added, NE resumed as needed Continued NSVT episodes Lytes repleted along the way     7/2 hemoptysis resolved with only old clots with cough, diuresing well 02/11/21 back on heparin gtt, feeling better, ambulating Suspect course of events likely viral illness/myocarditis > inactivity, followed by PE and pneumonia. Venous duplex negative for DVT.   7/4 off NE Planned ofr c.MRI and R/LHC when able Described as continued tachycardic, only PVCs no NSVT remained on amio Off antibiotics   7/5 LHC no CAD RA mean 9 RV 52/17 PA 53/17, mean 31 PCWP mean 16 LV 90/14 AO 90/63  Oxygen saturations: PA 57% AO 98%  Cardiac Output (Fick) 5.1  Cardiac Index (Fick) 2.48  PVR 2.9 WU   TSH 13 and synthroid resumed yesterday   02/13/21 EVENING developed PMVT and was defibrillated x2, brief CPR, QT felt to be prolonged and amio stopped Given K and mag and milrinone was reduced   02/14/21 BPs better, midodrine stopped,losartan added, also on ivabradine HF noted" Overall picture concerning, she is someone we may need to consider advanced  therapies for" She had another episode of this described as VF with transient CPR and shocked to SR, started on lidocaine gtt 100mg  bolus > 1mg /min Ivabradine stopped (case reports of TdP were found by the pharmacist) Hep gtt stopped with 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 by repeat labs, planned to repeat CBC again and start  bivalirudin (patient has PE) if plts remained low   EP is asked to the case with episodes of Torsades/VF    Assessment & Plan     New (presumably) and severe BiVe failure PMVT Cardiogenic shock Remains on milrinone Off NE a couple days antibiotics stopped 7/3 Off proamatine (stopped  yesterday) Ivabrdine stopped yesterday (started 7/5) with some case studies of pro arrhythmia   Dr. Ladona Ridgel has seen and examined the patient this AM, discussed with Dr. Shirlee Latch Not felt to be long/short triggered arrhythmia and most likelly 2/2 sever LV dysfunction and acute HF, illness and meds. Agreed QT did appear to get long  Avoid ALL potential QT prolonging medicines Start BB when able Keep K+ towards 5 and Mag >2 Plan C.MRI when able    lidocaine level is 5.0 (high normal), Dr. Ladona Ridgel and Dr. Shirlee Latch discussed, will stop lidocaine gtt  For questions or updates, please contact [facility] Please  consult [email or web address] for contact info under        Signed, Sheilah Pigeon, PA-C  02/15/2021, 7:35 AM    EP Attending  Patient seen and examined. Agree with the findings as noted above. She has had no additional VT. Agree with stopping amio and ivabradine. With lido level elevated, I would stop it and observe. Start low dose beta blocker when pressure allows.  Sharlot Gowda Chyanna Flock,MD

## 2021-02-15 NOTE — Progress Notes (Signed)
Physical Therapy Treatment Patient Details Name: Debra Daniel MRN: [id number] DOB: [birth year] Today's Date: 02/15/2021    History of Present Illness 35 y/o female transferred 6/28 from [facility] with mild hemoptysis, worsening LE edema and Rt sided segmental PE on CT.  Heparin caused significant worsening of her hemoptysis, so now not anticoagulated with transfer to ICU 6/30. Echo demonstrated LVEF of 25-30% with mod-severe mitral regurgitation.  7/5 heart cath with Vfib arrest that evening s/p defibrillation x 2. 7/6 Vtach with shock x 1 back to NSR. PMHx:  hypothyroidism, UTI's    PT Comments    Pt ambulated approximately a total of 331ft today with RW due to soreness and fatigue. Pt's O2 reduced to 0.5L/min midway through ambulation. Gait speed increased with use of RW compared to previous session without. Recommend continuing PT for gait training and strengthening LEs.   Follow Up Recommendations  No PT follow up;Other (comment)     Equipment Recommendations  None recommended by PT    Recommendations for Other Services       Precautions / Restrictions Restrictions Weight Bearing Restrictions: No    Mobility  Bed Mobility               General bed mobility comments: Pt seated in recliner prior to PT. Vitals taken in standing- HR 114, spO2 95, BP 98/67(77)    Transfers Overall transfer level: Needs assistance Equipment used: Rolling walker (2 wheeled) Transfers: Sit to/from Stand Sit to Stand: Min guard         General transfer comment: Used RW to perform transfer due to fatigue and soreness  Ambulation/Gait Ambulation/Gait assistance: Min guard Gait Distance (Feet): 300 Feet Assistive device: Rolling walker (2 wheeled) Gait Pattern/deviations: Step-through pattern;WFL(Within Functional Limits)   Gait velocity interpretation: >2.62 ft/sec, indicative of community ambulatory General Gait Details: Pt on 2L O2. Stopped midway to reduce L of O2 to 0.5L,  spO2 92. Pt demonstrated step through pattern with RW. Gait speed increased from previous visit. Max HR 125   Stairs             Wheelchair Mobility    Modified Rankin (Stroke Patients Only)       Balance                                            Cognition Arousal/Alertness: Awake/alert Behavior During Therapy: WFL for tasks assessed/performed Overall Cognitive Status: Within Functional Limits for tasks assessed                                        Exercises General Exercises - Lower Extremity Long Arc Quad: 15 reps;Seated;Both Hip Flexion/Marching: Both;15 reps;Seated Toe Raises: Both;15 reps;Seated Heel Raises: Both;15 reps;Seated    General Comments        Pertinent Vitals/Pain      Home Living                      Prior Function            PT Goals (current goals can now be found in the care plan section)      Frequency    Min 3X/week      PT Plan      Co-evaluation  AM-PAC PT "6 Clicks" Mobility   Outcome Measure                   End of Session Equipment Utilized During Treatment: Gait belt;Other (comment);Oxygen (RW) Activity Tolerance: Patient tolerated treatment well Patient left: with call bell/phone within reach;with family/visitor present;in bed;Other (comment) (Pt in chair on 1L O2)   PT Visit Diagnosis: Muscle weakness (generalized) (M62.81);Difficulty in walking, not elsewhere classified (R26.2);Other abnormalities of gait and mobility (R26.89)     Time: 6945-0388 PT Time Calculation (min) (ACUTE ONLY): 20 min  Charges:  $Gait Training: 8-22 mins                     Velda Shell, SPT  Acute Rehab: [phone]   Vance Gather 02/15/2021, 11:31 AM

## 2021-02-15 NOTE — Progress Notes (Addendum)
ANTICOAGULATION CONSULT NOTE  Pharmacy Consult for bivalirudin Indication: pulmonary embolus  Allergies  Allergen Reactions   Heparin Anaphylaxis    Hit antibody sent 02/14/21   Sulfa Antibiotics Hives   Ibuprofen Other (See Comments)    Constipation Can tolerate w milk of mag    Patient Measurements: Height: 5' 4.5" (163.8 cm) Weight: 98.1 kg (216 lb 4.3 oz) IBW/kg (Calculated) : 55.85 Heparin Dosing Weight: ~ 80 kg  Vital Signs: Temp: 100.1 F (37.8 C) (07/07 0700) Temp Source: Axillary (07/07 0700) BP: 103/75 (07/07 0800) Pulse Rate: 106 (07/07 0800)  Labs: Recent Labs    02/12/21 1753 02/13/21 0400 02/13/21 0455 02/13/21 1351 02/13/21 1725 02/13/21 2139 02/13/21 2206 02/14/21 0520 02/14/21 0539 02/14/21 0805 02/14/21 1002 02/14/21 1113 02/14/21 1332 02/14/21 2100 02/15/21 0427 02/15/21 0534  HGB  --   --    < >  --    < >  --    < >  --    < > 6.7* 9.9*  --   --   --  9.5*  --   HCT  --   --    < >  --    < >  --    < >  --    < > 21.5* 31.1*  --   --   --  30.2*  --   PLT  --   --    < >  --   --   --    < >  --   --  64* 90*  --  93*  --  121*  --   APTT  --   --   --   --   --   --   --   --   --   --   --   --   --  70* 136* 56*  HEPARINUNFRC <0.10* 0.11*  --  0.22*  --   --   --   --   --   --   --   --   --   --   --   --   CREATININE  --   --    < >  --   --  1.17*  --  0.89  --   --   --  0.98  --   --  1.01*  --   TROPONINIHS  --   --   --   --   --  38*  --  53*  --   --   --   --   --   --   --   --    < > = values in this interval not displayed.     Estimated Creatinine Clearance: 91.1 mL/min (A) (by C-G formula based on SCr of 1.01 mg/dL (H)).  Assessment: 35 yo female with new PE for heparin. No AC PTA. Pt had significant hemoptysis initially so heparin level goal is lower.  Pt with declining platelet count and concern for HIT. HIT Ab sent, bivalirudin started 7/6.  Pltc today is impvoed to 120s, aPTT is therapeutic at 53 seconds. Pt  noted to have ongoing hemoptysis.  Goal of Therapy:  aPTT 50-70 seconds Monitor platelets by anticoagulation protocol: Yes   Plan:  Continue bivalirudin 0.1 mg/kg/h Confirmatory aPTT this afternoon F/U HIT antibody  ADDENDUM: Confirmatory aPTT is 54 seconds and within goal range. Continue rate of 0.1 mg/kg/h   Fredonia Highland, PharmD, BCPS, [facility] Clinical Pharmacist  [phone] Please check AMION for all [facility] numbers 02/15/2021

## 2021-02-15 NOTE — Progress Notes (Signed)
Pharmacy Heparin Induced Thrombocytopenia (HIT) Note:  Debra Daniel is an 35 y.o. female being evaluated for HIT. Heparin was started 7/2 for PE, and baseline platelets were 145.   HIT labs were ordered on 7/5 when platelets dropped to 60.  Auto-populate labs:  Heparin Induced Plt Ab  Date/Time Value Ref Range Status  02/14/2021 11:13 AM 2.313 (H) 0.000 - 0.400 OD Final    Comment:    (NOTE) Performed At: [facility] [address] [place], [place] [id number] Jolene Schimke MD QP:[id number]      CALCULATE SCORE:  4Ts (see the HIT Algorithm) Score  Thrombocytopenia 3  Timing 1  Thrombosis 0  Other causes of thrombocytopenia 1  Total 55     Recommendations (A or B) are based on available lab results (HIT antibody and/or SRA) and the HIT algorithm    A. HIT antibody result available  Possible HIT   Order SRA:  Yes Discontinue heparin / LMWH:  Yes Initiate alternative anticoagulation:  Yes Document heparin allergy:  Yes   B. SRA result availability  SRA not available   Name of MD Contacted: n/a  Plan (Discussed with provider) Labs ordered:  SRA ordered  Heparin allergy:  Heparin allergy documented or updated. Anticoagulation plans:  Bivalirudin initiated alread   Comments (List any alternative plans or if there are contraindications to therapy)   Mosetta Anis 02/15/2021, 6:04 PM

## 2021-02-15 NOTE — Progress Notes (Signed)
Mg 1.9 Electrolytes replaced per [facility] electrolyte replacement protocol

## 2021-02-15 NOTE — Progress Notes (Signed)
Attending:    Subjective: VF yesterday morning, none since then PLT stable, on bivalrudin Electrolytes OK Coox remains low Remains on milrinone, lidocaine, OUP is OK No more hemoptysis  Objective: Vitals:   02/15/21 0500 02/15/21 0600 02/15/21 0700 02/15/21 0800  BP: 91/69 97/66 100/74 103/75  Pulse: 92 96 99 (!) 106  Resp: (!) 30 (!) 32 19 (!) 28  Temp:   100.1 F (37.8 C)   TempSrc:   Axillary   SpO2: 100% 97% 99% 97%  Weight: 98.1 kg     Height:          Intake/Output Summary (Last 24 hours) at 02/15/2021 1110 Last data filed at 02/15/2021 0900 Gross per 24 hour  Intake 1153.97 ml  Output 700 ml  Net 453.97 ml    General:  Resting comfortably in chair HENT: NCAT OP clear PULM: CTA B, normal effort CV: RRR, no mgr GI: BS+, soft, nontender MSK: normal bulk and tone Neuro: awake, alert, no distress, MAEW   CBC    Component Value Date/Time   WBC 17.5 (H) 02/15/2021 0427   RBC 3.69 (L) 02/15/2021 0427   HGB 9.5 (L) 02/15/2021 0427   HCT 30.2 (L) 02/15/2021 0427   PLT 121 (L) 02/15/2021 0427   MCV 81.8 02/15/2021 0427   MCH 25.7 (L) 02/15/2021 0427   MCHC 31.5 02/15/2021 0427   RDW 17.9 (H) 02/15/2021 0427   LYMPHSABS 1.8 02/08/2021 0334   MONOABS 1.2 (H) 02/08/2021 0334   EOSABS 0.0 02/08/2021 0334   BASOSABS 0.0 02/08/2021 0334    BMET    Component Value Date/Time   NA 135 02/15/2021 0427   K 4.4 02/15/2021 0427   CL 98 02/15/2021 0427   CO2 30 02/15/2021 0427   GLUCOSE 110 (H) 02/15/2021 0427   BUN 17 02/15/2021 0427   CREATININE 1.01 (H) 02/15/2021 0427   CALCIUM 8.2 (L) 02/15/2021 0427   GFRNONAA >60 02/15/2021 0427    CXR images none today  Impression/Plan: Fever overnight with elevated WBC> given over all clinical improvement (she is up walking around and feeling better today) I do not think she is septic, so will hold off on treating her.  However if the fever recurs or if her WBC rises then we will need to repeat CXR, consider cultures,  and start antibiotics for nosocomial organisms Remains critically ill due to cardiogenic shock> unclear etiology, Cardiac MRI pending, follow up result, continue milrinone per cardiology VT/VF arrest> per cardiology/EP Pulmonary embolism > continue bivalrudin until HITT panel back Thrombocytopenia improving > f/u HITT panel  My cc time 31 minutes  Heber Urbana, MD [place] [facility] Pager: [phone] Cell: [phone] After 7pm: [phone]

## 2021-02-15 NOTE — Progress Notes (Signed)
Patient ID: Debra Daniel, female   DOB: 1985-08-18, 35 y.o.   MRN: [id number]     Advanced Heart Failure Rounding Note  PCP-Cardiologist: None   Subjective:   -05/13: Seen in ED for recurrent nausea and vomiting. Treated for possible UTI. Ongoing weakness, fatigue, intermittent N/V, cough since. -06/27: ED at OSH with hemoptysis. Found to have segmental PE and pneumonia. EF 25-30%. -06/28: Transferred to [facility]. Cardiogenic shock > milrinone 0.25 mcg. Diuresed with IV lasix. -06/29: Tachycardic. MAT noted on tele. Added digoxin and spiro. Amio added d/t NSVT. Last dose IV lasix. Norepi off. - 6/30: midodrine started and lasix drip started.  - 7/5: RHC/LHC as below.  Around 9 pm, developed PMVT and required defibrillation x 2 with brief CPR.  QTc prolonged, amiodarone was stopped.  She received Mg and K. Milrinone decreased to 0.125.  She had another episode PMVT, Corlanor stopped.   Continue milrinone 0.125, co-ox 50%.  CVP 10.  No further    Hemoptysis resolved, now on bivalirudin gtt with hgb 9.5. She is now off antibiotics. Plts 121 today, HIT pending.   No further PMVT, on lidocaine gtt with level 5.   Has RLL PE seen at [facility] on CTA chest.    Walked around unit this morning already. Denies dyspnea.   Echo 02/06/2021: LVEF 10-15%, LV moderately dilated, RV systolic function severely reduced, RV moderately enlarged, RVSP 45.9 mmHg, severe secondary MR, mod-severe TR (personally reviewed).  LHC/RHC (7/5):  Left Main  Vessel was injected. Vessel is normal in caliber. Vessel is angiographically normal.  Left Anterior Descending  Vessel was injected. Vessel is normal in caliber. Vessel is angiographically normal.  Left Circumflex  Vessel was injected. Vessel is normal in caliber. Vessel is angiographically normal.  Right Coronary Artery  Vessel was injected. Vessel is normal in caliber. Vessel is angiographically normal.   Intervention   No interventions have been  documented.    Right Heart  Right Heart Pressures RHC Procedural Findings (on milrinone 0.25): Hemodynamics (mmHg) RA mean 9 RV 52/17 PA 53/17, mean 31 PCWP mean 16 LV 90/14 AO 90/63  Oxygen saturations: PA 57% AO 98%  Cardiac Output (Fick) 5.1  Cardiac Index (Fick) 2.48  PVR 2.9 WU    Objective:   Weight Range: 98.1 kg Body mass index is 36.55 kg/m.   Vital Signs:   Temp:  [96.9 F (36.1 C)-98.5 F (36.9 C)] 96.9 F (36.1 C) (07/07 0400) Pulse Rate:  [52-99] 99 (07/07 0700) Resp:  [17-36] 19 (07/07 0700) BP: (82-143)/(59-93) 100/74 (07/07 0700) SpO2:  [96 %-100 %] 99 % (07/07 0700) Weight:  [98.1 kg] 98.1 kg (07/07 0500) Last BM Date: 02/13/21  Weight change: Filed Weights   02/13/21 0500 02/14/21 0500 02/15/21 0500  Weight: 97.3 kg 98 kg 98.1 kg    Intake/Output:   Intake/Output Summary (Last 24 hours) at 02/15/2021 0748 Last data filed at 02/15/2021 0700 Gross per 24 hour  Intake 1136.27 ml  Output 1050 ml  Net 86.27 ml      Physical Exam   CVP 10 General: NAD Neck:JVP 10 cm, no thyromegaly or thyroid nodule.  Lungs: Clear to auscultation bilaterally with normal respiratory effort. CV: Nondisplaced PMI.  Heart regular S1/S2, no S3/S4, 1/6 HSM apex.  1+ ankle edema.  Abdomen: Soft, nontender, no hepatosplenomegaly, no distention.  Skin: Intact without lesions or rashes.  Neurologic: Alert and oriented x 3.  Psych: Normal affect. Extremities: No clubbing or cyanosis.  HEENT: Normal.    Telemetry  ST 100s-110s (personally reviewed)  Labs    CBC Recent Labs    02/14/21 1002 02/14/21 1332 02/15/21 0427  WBC 17.2*  --  17.5*  HGB 9.9*  --  9.5*  HCT 31.1*  --  30.2*  MCV 81.6  --  81.8  PLT 90* 93* 121*   Basic Metabolic Panel Recent Labs    41/28/78 1113 02/15/21 0427  NA 132* 135  K 4.3 4.4  CL 94* 98  CO2 28 30  GLUCOSE 159* 110*  BUN 16 17  CREATININE 0.98 1.01*  CALCIUM 8.2* 8.2*  MG 2.2 1.9   Liver Function  Tests Recent Labs    02/13/21 2139  AST 58*  ALT 41  ALKPHOS 64  BILITOT 2.0*  PROT 5.4*  ALBUMIN 1.9*    No results for input(s): LIPASE, AMYLASE in the last 72 hours. Cardiac Enzymes No results for input(s): CKTOTAL, CKMB, CKMBINDEX, TROPONINI in the last 72 hours.  BNP: BNP (last 3 results) Recent Labs    02/06/21 2018  BNP 1,571.8*    ProBNP (last 3 results) No results for input(s): PROBNP in the last 8760 hours.   D-Dimer No results for input(s): DDIMER in the last 72 hours.  Hemoglobin A1C No results for input(s): HGBA1C in the last 72 hours.  Fasting Lipid Panel No results for input(s): CHOL, HDL, LDLCALC, TRIG, CHOLHDL, LDLDIRECT in the last 72 hours. Thyroid Function Tests No results for input(s): TSH, T4TOTAL, T3FREE, THYROIDAB in the last 72 hours.  Invalid input(s): FREET3   Other results:   Imaging    No results found.   Medications:     Scheduled Medications:  Chlorhexidine Gluconate Cloth  6 each Topical Daily   digoxin  0.125 mg Oral Daily   insulin aspart  0-5 Units Subcutaneous QHS   insulin aspart  0-9 Units Subcutaneous TID WC   levothyroxine  75 mcg Oral Q0600   losartan  12.5 mg Oral Daily   mouth rinse  15 mL Mouth Rinse BID   sodium chloride flush  10-40 mL Intracatheter Q12H   sodium chloride flush  3 mL Intravenous Q12H   spironolactone  25 mg Oral Daily   torsemide  40 mg Oral BID    Infusions:  sodium chloride 250 mL (02/15/21 0539)   sodium chloride 10 mL/hr at 02/13/21 1500   sodium chloride Stopped (02/14/21 1329)   bivalirudin (ANGIOMAX) infusion 0.5 mg/mL (Non-ACS indications) 0.1 mg/kg/hr (02/15/21 0700)   lidocaine 1 mg/min (02/15/21 0700)   milrinone 0.125 mcg/kg/min (02/15/21 0700)    PRN Medications: sodium chloride, sodium chloride, acetaminophen, lip balm, sodium chloride flush    Assessment/Plan   Acute systolic HF -> Cardiogenic shock/acute biventricular heart failure/new cardiomyopathy -New  diagnosis of CHF this admission after presenting with hemoptysis at OSH in [place]. Suspected viral illness with > 1 month hx weakness, fatigue, cough, N/V. HIV negative. Recently diagnosed with hypothyroidism.  Etiology => viral myocarditis versus familial cardiomyopathy (mother and aunts/uncles with CHF).  Coronary angiography was normal this admission.   - Echo here with EF of 10-15%, mildly dilated LV, moderate to severely reduced RV fxn with mildly dilated RV, RVSP 45 mmHg, severe secondary MR, mod-severe TR. - BNP 1,571. Pregnancy test negative.  - Currently on 0.125 mcg milrinone, off NE.  RHC with CI 2.48 on milrinone 0.25.  Co-ox this morning 50%.    - CVP 10, increase torsemide to 40 mg bid.  - Now off midodrine.  - Continue losartan 12.5  daily.  - Increase spironolactone to 25 mg daily.   - Continue digoxin, level 0.4 today.  - Off ivabradine with PMVT.  - No beta blocker due to shock. - Cardiac MRI ordered.  - Overall picture concerning, she is someone we may need to consider advanced therapies for.   2. Mitral valve regurgitation - Severe on echo this admission.  - Likely secondary, valve does not look abnormal on echo.   3. Moderate-severe TR - Likely secondary  4. NSVT => episodes of PMVT - She had been on amiodarone gtt for NSVT, prolonged QTc and developed PMVT.  Now off amiodarone and Corlanor and on lower dose of milrinone.  She is on lidocaine 1 with level 5.  No further PMVT overnight.  - Stop lidocaine today.  - Keep K > 4, Mg > 2.   5. Hemoptysis/community acquired pneumonia - Possible PE noted on imaging at OSH. Suspect course of events likely viral illness/myocarditis > inactivity, followed by PE and pneumonia. Venous duplex negative for DVT. - CT chest here with consolidation both lower lobes. Ground glass opacities right middle lobe. - Was on ceftriaxone, now stopped by CCM.  - Suspect hemoptysis was due to elevated left atrial pressure.  - WBC 17.0. > 19.6>  19 > 17.9 > 17.3 > 17.6 > 17.5.  - Hgb 9.5, no further hemoptysis. Now on bivalirudin for PE. Eventually transition to Eliquis.   6. RLL PE - Seen on imaging at [facility] (CTA chest).  - On bivalirudin gtt, eventually transition to Eliquis.   6. Hypothyroidism -Recently diagnosed. -TSH 13.5/Free T4 1.39. -On synthroid PTA.   7. Elevated INR - Possibly due to passive congestion from CHF. - Given Vit K 6/29, 6/30, and 7/1 per CCM.  8. Hyponatremia - Resolved.  - Restrict free water  9. Acute blood loss anemia -Hgb 12.6 > 13.5 > 9.8> 9 > 10 > 9.4 > 9.6 > 9.9 > 9.5.  -Presented with massive hemoptysis which is now improved, suspect due primarily to elevated LA pressure.  10. Thrombocytopenia - Heparin stopped, HIT pending.  Patient is currently on bivalirudin, plts 121 today.   Ambulate today  CRITICAL CARE Performed by: Marca Ancona  Total critical care time: 40 minutes  Critical care time was exclusive of separately billable procedures and treating other patients.  Critical care was necessary to treat or prevent imminent or life-threatening deterioration.  Critical care was time spent personally by me (independent of midlevel providers or residents) on the following activities: development of treatment plan with patient and/or surrogate as well as nursing, discussions with consultants, evaluation of patient's response to treatment, examination of patient, obtaining history from patient or surrogate, ordering and performing treatments and interventions, ordering and review of laboratory studies, ordering and review of radiographic studies, pulse oximetry and re-evaluation of patient's condition.  Marca Ancona, MD  7:48 AM 02/15/2021

## 2021-02-15 NOTE — Progress Notes (Signed)
PT Cancellation Note  Patient Details Name: Debra Daniel MRN: [id number] DOB: [birth year]   Cancelled Treatment:    Reason Eval/Treat Not Completed: Patient at procedure or test/unavailable   Barbara Keng B Lenee Franze 02/15/2021, 10:24 AM Merryl Hacker, PT Acute Rehabilitation Services Pager: [phone] Office: [phone]

## 2021-02-15 NOTE — Progress Notes (Signed)
NAME:  Debra Daniel, MRN:  [id number], DOB:  1986-05-17, LOS: 9 ADMISSION DATE:  02/06/2021, CONSULTATION DATE:  6/28 REFERRING MD:  Dr. Adela Glimpse, CHIEF COMPLAINT:  hemoptysis   History of Present Illness:  35 year old female with no significant past medical history up until quite recently. Three months prior to admission she was diagnosed with hypothyroid and was started on synthroid. Then, approximately around 12/22/20 she was evaluated with recurrent N/V and UTI symptoms. She is unclear what medications she was treated with, although she remembers the first one was ineffective and a second antibiotic was needed. Progressive lower extremity edema since that time as well. Then 6/25 she began to experience mild hemoptysis, which prompted her to present to [facility] 6/27. Workup in the ED included CTA chest and CT abdomen/pelvis. She was found to have right sided segmental PE as well a crazy paving pattern on the CT chest. CT abdomen was non-acute. She was admitted. Started on heparin for PE, which caused her hemoptysis to become much worse. Heparin was stopped. Echocardiogram done demonstrated LVEF 25-30% as well as mod-severe mitral regurgitation. She was transferred to [facility] for further evaluation.   Pertinent  Medical History   has a past medical history of Encounter for menstrual regulation (01/24/2015), Hypothyroid, Migraines, Obesity, and UTI (urinary tract infection).    has a past medical history of Encounter for menstrual regulation (01/24/2015), Hypothyroid, Migraines, Obesity, and UTI (urinary tract infection).   reports that she has never smoked. She has never used smokeless tobacco.   Significant Hospital Events: Including procedures, antibiotic start and stop dates in addition to other pertinent events   6/27 admit to [place] for hemotpysis. Dx PE. Worsening hemoptysis  6/28 tx to [facility] > then to ICU due to hypotension, hemoptysis.  6/29 milrinone, NE, UOP  picking up, PICC placed, MAT on tele, added digoxin and spiro per HF, amio added for NSVT, NE off 6/30 hemoptysis improving but present, Cr better, feels better, on milrinone, added lasix gtt 7/1 going to add some txa nebs, started on midodrine  7/2 no further hemoptysis and no SCVO2 better. Started on low dose heparin 7/3 on 2L, walked around unit, continues on lasix gtt, CVP 12, remains on milrinone 0.375, abx stopped; txa nebs complete 7/4 lasix gtt stopped, metolazone x 1, CVP 7-8, hemoptysis resolved, abx, remains on milrinone, on room air  7/5 Vfib arrest-defib x2; CPR 4 minutes to ROSC; then patient alert and interactive; EKG with prolonged Qtc (beyond 500 ms); stopped amio and given mag; low calcium-given calcium gluconate 7/6- 1 Episode of torsades in am. One shock was given. Back to NSR w/ ectopy. Lidocaine started.   Ivabradine stopped. Heparin stopped due to low platelets and started on Bivalirudin   6/28 MRSA neg 6/28 RVP neg 7/5 SARS neg 6/28 urine strep and legionella neg  6/28 vanc 6/28 cefepime >>6/29 6/29 ceftriaxone > 7/3 6/29 doxycycline> 7/1  6/29 CT chest >> Areas of consolidation in the right lower lobe, left lower lobe and right middle lobe compatible with multifocal pneumonia.   6/29 LE dopplers >>neg for DVT  Interim History / Subjective:   Episode of torsades earlier yesterday. One shock was given. Back to NSR w/ ectopy.  Lidocaine started. Heparin stopped due to low platelets and started on Bivalirudin.  This morning, patient sitting up in chair drinking water; no complaints.  Mag 1.9 this am; Elink replaced UO -1 L Afebrile/WBC 17.5 CVP 11/coox 50% (from 52%) On lidocaine and  Milrinone 0.125  Objective   Blood pressure 100/74, pulse 99, temperature (!) 96.9 F (36.1 C), temperature source Axillary, resp. rate 19, height 5' 4.5" (1.638 m), weight 98.1 kg, last menstrual period 10/24/2020, SpO2 99 %. CVP:  [5 mmHg-11 mmHg] 9 mmHg       Intake/Output Summary (Last 24 hours) at 02/15/2021 0719 Last data filed at 02/15/2021 0700 Gross per 24 hour  Intake 1136.27 ml  Output 1050 ml  Net 86.27 ml    Filed Weights   02/13/21 0500 02/14/21 0500 02/15/21 0500  Weight: 97.3 kg 98 kg 98.1 kg    Examination: General:  Pale appearing female up in chair in NAD HEENT: MM pink/moist; Harrison in place Neuro: Aox3; MAE CV: s1s2, RRR, no m/r/g PULM:  dim clear bs bilaterally; small hemoptysis; [place] 2 l/m GI: soft, bsx4 active    LABS   K 4.4, Mag 1.9 Creat 1.01 (from 0.98) WBC 17.5 (from 17.2) Hgb 9.5 Platelets 121 (from 90)   Resolved Hospital Problem list   AGMA Hyponatremia  Assessment & Plan:   Acute systolic HF with cardiogenic shock/ cardiomyopathy with acute biventricular heart failure: new dx this admit. Echo 6/28: EF 10-15%. mildly dilated LV, moderate to severely reduced RV fxn with mildly dilated RV, RVSP 45 mmHg, severe secondary MR, mod-severe TR. Recent hypothyroidism dx.  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 Mitral valve regurgitation (normal valves on TTE)  Moderate- severe TR  P: -Appreciate HF input -continue milrinone and Lidocaine -trend coox and cvp -trend electrolytes and UOP -continue digoxin, torsemide, losartan, and spiro -strict I/Os; daily weights  NSVT: 7/5 episode of torsades de pointes vfib arrest x 4 minutes MAT P: -Per HF -given mag today -continue to trend electrolytes and replete K/mag for K goal >4, Mag goal > 2 -avoid qtc prolonging meds  Hemoptysis (?2/2 to PE vs elevated LA pressures, s/p txa nebs 7/1-7/3 CAP : abx completed 7/3 RLL PE (on [place] CTA), neg LE dopplers 6/29 P: -continue bival; hold heparin due to possible HIT (HIT panel pending) -OOB as tolerated/ PT/ pulm hygiene -wean o2 for sats >92%  Hypothyroidism P: - continue synthroid  Elevated INR Elevated LFTs :likely 2/2 hepatic congestion from biV failure P: - trend  coags / LFTs  ABLA likely secondary to hemoptysis since resolved  Thrombocytopenia: possibly HIT; HIT panel pending P: -Trend CBC -transfuse for hgb <8 -Follow HIT panel; continue bival and hold heparin  Hyperglycemia P: - SSI and cbg monitoring  Best Practice (right click and "Reselect all SmartList Selections" daily)   Diet/type: clear liquids  DVT prophylaxis: other - TED stocking and systemic heparin. GI prophylaxis: N/A Lines: Central line and yes and it is still needed; RUE PICC Foley:  N/A Code Status:  full code Last date of multidisciplinary goals of care discussion [ 6/28] Patient updated at bedside on 7/7   Critical care time: 35 minutes   JD Anselm Lis [facility] 02/15/2021, 7:19 AM  Please see [email or web address] for pager details.  From 7A-7P if no response, please call [phone]. After hours, please call ELink [phone].

## 2021-02-16 ENCOUNTER — Inpatient Hospital Stay (HOSPITAL_COMMUNITY): Payer: Medicaid Other

## 2021-02-16 LAB — BASIC METABOLIC PANEL
Anion gap: 6 (ref 5–15)
BUN: 17 mg/dL (ref 6–20)
CO2: 29 mmol/L (ref 22–32)
Calcium: 7.8 mg/dL — ABNORMAL LOW (ref 8.9–10.3)
Chloride: 97 mmol/L — ABNORMAL LOW (ref 98–111)
Creatinine, Ser: 0.88 mg/dL (ref 0.44–1.00)
GFR, Estimated: >60 mL/min (ref >60)
Glucose, Bld: 133 mg/dL — ABNORMAL HIGH (ref 70–99)
Potassium: 4 mmol/L (ref 3.5–5.1)
Sodium: 132 mmol/L — ABNORMAL LOW (ref 135–145)

## 2021-02-16 LAB — CBC
HCT: 29 % — ABNORMAL LOW (ref 36.0–46.0)
Hemoglobin: 9 g/dL — ABNORMAL LOW (ref 12.0–15.0)
MCH: 25.5 pg — ABNORMAL LOW (ref 26.0–34.0)
MCHC: 31 g/dL (ref 30.0–36.0)
MCV: 82.2 fL (ref 80.0–100.0)
Platelets: 113 10*3/uL — ABNORMAL LOW (ref 150–400)
RBC: 3.53 MIL/uL — ABNORMAL LOW (ref 3.87–5.11)
RDW: 18.1 % — ABNORMAL HIGH (ref 11.5–15.5)
WBC: 17.2 10*3/uL — ABNORMAL HIGH (ref 4.0–10.5)
nRBC: 0 % (ref 0.0–0.2)

## 2021-02-16 LAB — COOXEMETRY PANEL
Carboxyhemoglobin: 1.6 % — ABNORMAL HIGH (ref 0.5–1.5)
Methemoglobin: 0.9 % (ref 0.0–1.5)
O2 Saturation: 59.7 %
Total hemoglobin: 9.4 g/dL — ABNORMAL LOW (ref 12.0–16.0)

## 2021-02-16 LAB — APTT: aPTT: 60 seconds — ABNORMAL HIGH (ref 24–36)

## 2021-02-16 LAB — EXPECTORATED SPUTUM ASSESSMENT W GRAM STAIN, RFLX TO RESP C

## 2021-02-16 LAB — GLUCOSE, CAPILLARY
Glucose-Capillary: 97 mg/dL (ref 70–99)
Glucose-Capillary: 115 mg/dL — ABNORMAL HIGH (ref 70–99)
Glucose-Capillary: 95 mg/dL (ref 70–99)
Glucose-Capillary: 90 mg/dL (ref 70–99)

## 2021-02-16 LAB — PROCALCITONIN: Procalcitonin: 0.31 ng/mL

## 2021-02-16 LAB — MAGNESIUM: Magnesium: 1.7 mg/dL (ref 1.7–2.4)

## 2021-02-16 MED ORDER — LOSARTAN POTASSIUM 25 MG PO TABS
25.0000 mg | ORAL_TABLET | Freq: Every day | ORAL | Status: DC
  Administered 2021-02-16 – 2021-02-20 (×5): 25 mg via ORAL
  Filled 2021-02-16 (×5): qty 1

## 2021-02-16 MED ORDER — SODIUM CHLORIDE 0.9 % IV SOLN
2.0000 g | Freq: Three times a day (TID) | INTRAVENOUS | Status: AC
  Administered 2021-02-16 – 2021-02-20 (×15): 2 g via INTRAVENOUS
  Filled 2021-02-16 (×15): qty 2

## 2021-02-16 MED ORDER — DOCUSATE SODIUM 100 MG PO CAPS: 100.0000 mg | ORAL_CAPSULE | Freq: Two times a day (BID) | ORAL | Status: DC | PRN

## 2021-02-16 MED ORDER — APIXABAN 5 MG PO TABS: 5.0000 mg | ORAL_TABLET | Freq: Two times a day (BID) | ORAL | Status: DC

## 2021-02-16 MED ORDER — MAGNESIUM SULFATE 2 GM/50ML IV SOLN
2.0000 g | Freq: Once | INTRAVENOUS | Status: AC
  Administered 2021-02-16: 2 g via INTRAVENOUS
  Filled 2021-02-16: qty 50

## 2021-02-16 MED ORDER — DAPAGLIFLOZIN PROPANEDIOL 10 MG PO TABS
10.0000 mg | ORAL_TABLET | Freq: Every day | ORAL | Status: DC
  Administered 2021-02-16 – 2021-02-22 (×7): 10 mg via ORAL
  Filled 2021-02-16 (×8): qty 1

## 2021-02-16 MED ORDER — APIXABAN 5 MG PO TABS
10.0000 mg | ORAL_TABLET | Freq: Two times a day (BID) | ORAL | Status: DC
  Administered 2021-02-16 – 2021-02-22 (×13): 10 mg via ORAL
  Filled 2021-02-16 (×14): qty 2

## 2021-02-16 NOTE — Progress Notes (Signed)
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   Sulfa Antibiotics Hives   Ibuprofen Other (See Comments)    Constipation Can tolerate w milk of mag    Patient Measurements: Height: 5' 4.5" (163.8 cm) Weight: 98.6 kg (217 lb 6 oz) IBW/kg (Calculated) : 55.85 Heparin Dosing Weight: ~ 80 kg  Vital Signs: Temp: 99 F (37.2 C) (07/08 1123) Temp Source: Oral (07/08 1123) BP: 96/67 (07/08 1202) Pulse Rate: 116 (07/08 1202)  Labs: Recent Labs    02/13/21 1351 02/13/21 1725 02/13/21 2139 02/13/21 2206 02/14/21 0520 02/14/21 0539 02/14/21 1002 02/14/21 1113 02/14/21 1332 02/14/21 2100 02/15/21 0427 02/15/21 0534 02/15/21 1240 02/16/21 0422  HGB  --    < >  --    < >  --    < > 9.9*  --   --   --  9.5*  --   --  9.0*  HCT  --    < >  --    < >  --    < > 31.1*  --   --   --  30.2*  --   --  29.0*  PLT  --   --   --    < >  --    < > 90*  --  93*  --  121*  --   --  113*  APTT  --   --   --   --   --   --   --   --   --    < > 136* 56* 54* 60*  HEPARINUNFRC 0.22*  --   --   --   --   --   --   --   --   --   --   --   --   --   CREATININE  --    < > 1.17*  --  0.89  --   --    < >  --   --  1.01*  --  0.92 0.88  TROPONINIHS  --   --  38*  --  53*  --   --   --   --   --   --   --   --   --    < > = values in this interval not displayed.     Estimated Creatinine Clearance: 104.8 mL/min (by C-G formula based on SCr of 0.88 mg/dL).  Assessment: 35 yo female with new PE for heparin. No AC PTA. Pt had significant hemoptysis initially so heparin level goal is lower.  Pt with declining platelet count and now with positive HIT antibody, SRA pending. Pt remains on bivalirudin. aPTT is therapeutic at 60 seconds, H/H stable, pltc improving. Pt noted to have ongoing hemoptysis.  Goal of Therapy:  aPTT 50-70 seconds Monitor platelets by  anticoagulation protocol: Yes   Plan:  Continue bivalirudin 0.1 mg/kg/h Daily aPTT, CBC F/U SRA    Fredonia Highland, PharmD, BCPS, [facility] Clinical Pharmacist [phone] Please check AMION for all [facility] Pharmacy numbers 02/16/2021   PM CCM rounds No procedures planned  Will convert to oral anticoagulation Apixaban 10mg  BID x7 days  Then 5mg  BID Turn bivalirudin off after apixaban given   Pharm.D. CPP, BCPS Clinical Pharmacist [phone] 02/16/2021 12:35 PM

## 2021-02-16 NOTE — Progress Notes (Signed)
ANTICOAGULATION CONSULT NOTE  Pharmacy Consult for bivalirudin Indication: pulmonary embolus  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   Sulfa Antibiotics Hives   Ibuprofen Other (See Comments)    Constipation Can tolerate w milk of mag    Patient Measurements: Height: 5' 4.5" (163.8 cm) Weight: 98.6 kg (217 lb 6 oz) IBW/kg (Calculated) : 55.85 Heparin Dosing Weight: ~ 80 kg  Vital Signs: Temp: 97.8 F (36.6 C) (07/08 0454) Temp Source: Oral (07/08 0454) BP: 110/82 (07/08 0700) Pulse Rate: 108 (07/08 0700)  Labs: Recent Labs    02/13/21 1351 02/13/21 1725 02/13/21 2139 02/13/21 2206 02/14/21 0520 02/14/21 0539 02/14/21 1002 02/14/21 1113 02/14/21 1332 02/14/21 2100 02/15/21 0427 02/15/21 0534 02/15/21 1240 02/16/21 0422  HGB  --    < >  --    < >  --    < > 9.9*  --   --   --  9.5*  --   --  9.0*  HCT  --    < >  --    < >  --    < > 31.1*  --   --   --  30.2*  --   --  29.0*  PLT  --   --   --    < >  --    < > 90*  --  93*  --  121*  --   --  113*  APTT  --   --   --   --   --   --   --   --   --    < > 136* 56* 54* 60*  HEPARINUNFRC 0.22*  --   --   --   --   --   --   --   --   --   --   --   --   --   CREATININE  --    < > 1.17*  --  0.89  --   --    < >  --   --  1.01*  --  0.92 0.88  TROPONINIHS  --   --  38*  --  53*  --   --   --   --   --   --   --   --   --    < > = values in this interval not displayed.     Estimated Creatinine Clearance: 104.8 mL/min (by C-G formula based on SCr of 0.88 mg/dL).  Assessment: 35 yo female with new PE for heparin. No AC PTA. Pt had significant hemoptysis initially so heparin level goal is lower.  Pt with declining platelet count and now with positive HIT antibody, SRA pending. Pt remains on bivalirudin. aPTT is therapeutic at 60 seconds, H/H stable, pltc improving. Pt noted to have ongoing hemoptysis.  Goal of Therapy:  aPTT 50-70 seconds Monitor platelets by  anticoagulation protocol: Yes   Plan:  Continue bivalirudin 0.1 mg/kg/h Daily aPTT, CBC F/U SRA    Fredonia Highland, PharmD, BCPS, [facility] Clinical Pharmacist [phone] Please check AMION for all [facility] Pharmacy numbers 02/16/2021

## 2021-02-16 NOTE — Progress Notes (Signed)
NAME:  Debra Daniel, MRN:  [id number], DOB:  January 25, 1986, LOS: 10 ADMISSION DATE:  02/06/2021, CONSULTATION DATE:  6/28 REFERRING MD:  Dr. Adela Glimpse, CHIEF COMPLAINT:  hemoptysis   History of Present Illness:  35 year old female with no significant past medical history up until quite recently. Three months prior to admission she was diagnosed with hypothyroid and was started on synthroid. Then, approximately around 12/22/20 she was evaluated with recurrent N/V and UTI symptoms. She is unclear what medications she was treated with, although she remembers the first one was ineffective and a second antibiotic was needed. Progressive lower extremity edema since that time as well. Then 6/25 she began to experience mild hemoptysis, which prompted her to present to [facility] 6/27. Workup in the ED included CTA chest and CT abdomen/pelvis. She was found to have right sided segmental PE as well a crazy paving pattern on the CT chest. CT abdomen was non-acute. She was admitted. Started on heparin for PE, which caused her hemoptysis to become much worse. Heparin was stopped. Echocardiogram done demonstrated LVEF 25-30% as well as mod-severe mitral regurgitation. She was transferred to [facility] for further evaluation.   Pertinent  Medical History   has a past medical history of Encounter for menstrual regulation (01/24/2015), Hypothyroid, Migraines, Obesity, and UTI (urinary tract infection).    has a past medical history of Encounter for menstrual regulation (01/24/2015), Hypothyroid, Migraines, Obesity, and UTI (urinary tract infection).   reports that she has never smoked. She has never used smokeless tobacco.   Significant Hospital Events: Including procedures, antibiotic start and stop dates in addition to other pertinent events   6/27 admit to [place] for hemotpysis. Dx PE. Worsening hemoptysis  6/28 tx to [facility] > then to ICU due to hypotension, hemoptysis.  6/29 milrinone, NE, UOP  picking up, PICC placed, MAT on tele, added digoxin and spiro per HF, amio added for NSVT, NE off 6/30 hemoptysis improving but present, Cr better, feels better, on milrinone, added lasix gtt 7/1 going to add some txa nebs, started on midodrine  7/2 no further hemoptysis and no SCVO2 better. Started on low dose heparin 7/3 on 2L, walked around unit, continues on lasix gtt, CVP 12, remains on milrinone 0.375, abx stopped; txa nebs complete 7/4 lasix gtt stopped, metolazone x 1, CVP 7-8, hemoptysis resolved, abx, remains on milrinone, on room air  7/5 Vfib arrest-defib x2; CPR 4 minutes to ROSC; then patient alert and interactive; EKG with prolonged Qtc (beyond 500 ms); stopped amio and given mag; low calcium-given calcium gluconate 7/6- 1 Episode of torsades in am. One shock was given. Back to NSR w/ ectopy. Lidocaine started.   Ivabradine stopped. Heparin stopped due to low platelets and started on Bivalirudin   6/28 MRSA neg 6/28 RVP neg 7/5 SARS neg 6/28 urine strep and legionella neg  6/28 vanc 6/28 cefepime >>6/29 6/29 ceftriaxone > 7/3 6/29 doxycycline> 7/1  6/29 CT chest >> Areas of consolidation in the right lower lobe, left lower lobe and right middle lobe compatible with multifocal pneumonia.   6/29 LE dopplers >>neg for DVT  Interim History / Subjective:  7/8: no further events noted overnight. HF team stopping milrinone today. EP saw and considering GDMT with lifevest for now. Coox 59.7 this am. Remains on bival. Consider transitioning to oral agent. SRA pending but Hep ab 2  Objective   Blood pressure 99/66, pulse (!) 108, temperature 97.8 F (36.6 C), temperature source Oral, resp. rate (!) 24,  height 5' 4.5" (1.638 m), weight 98.6 kg, last menstrual period 10/24/2020, SpO2 100 %. CVP:  [4 mmHg-16 mmHg] 4 mmHg      Intake/Output Summary (Last 24 hours) at 02/16/2021 0973 Last data filed at 02/16/2021 0600 Gross per 24 hour  Intake 901.29 ml  Output 1950 ml  Net  -1048.71 ml   Filed Weights   02/14/21 0500 02/15/21 0500 02/16/21 0500  Weight: 98 kg 98.1 kg 98.6 kg    Examination: General:  Pale appearing female up in chair visiting with family HEENT: MM pink/moist; eomi, perrla Neuro: Aox3; MAE without focal deficits CV: s1s2, RRR, no m/r/g PULM:  dim clear bs bilaterally, productive cough GI: soft, bsx4 active    LABS   Lab Results  Component Value Date   CREATININE 0.88 02/16/2021   BUN 17 02/16/2021   NA 132 (L) 02/16/2021   K 4.0 02/16/2021   CL 97 (L) 02/16/2021   CO2 29 02/16/2021    Lab Results  Component Value Date   WBC 17.2 (H) 02/16/2021   HGB 9.0 (L) 02/16/2021   HCT 29.0 (L) 02/16/2021   MCV 82.2 02/16/2021   PLT 113 (L) 02/16/2021      Resolved Hospital Problem list   AGMA   Assessment & Plan:   Acute systolic HF with cardiogenic shock/ cardiomyopathy with acute biventricular heart failure: new dx this admit. Echo 6/28: EF 10-15%. mildly dilated LV, moderate to severely reduced RV fxn with mildly dilated RV, RVSP 45 mmHg, severe secondary MR, mod-severe TR. Recent hypothyroidism dx.   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 Mitral valve regurgitation (normal valves on TTE)  Moderate- severe TR: secondary P: -Appreciate HF input -milrinone to stop 7/8 -trend coox and cvp -continue meds per HF and EP -strict I/Os; daily weights  NSVT: 7/5 episode of torsades de pointes vfib arrest x 4 minutes MAT P: -Per HF, EP  -continue to trend electrolytes and replete K/mag for K goal >4, Mag goal > 2 -avoid qtc prolonging meds -EP appears to favor GDMT with lifevest initially  Hemoptysis (?2/2 to PE vs elevated LA pressures, s/p txa nebs 7/1-7/3 CAP : abx completed 7/3 RLL PE (on [place] CTA), neg LE dopplers 6/29 P: -continue bival; hold heparin due to possible HIT (hep Ab 2 (SRA pending) -consider changing to oral agent to progress care.  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 P: -Trend CBC -plts 113 today  -transfuse for hgb <8 -Follow SRA; continue bival and hold heparin  Hyperglycemia P: - SSI and cbg monitoring  Best Practice (right click and "Reselect all SmartList Selections" daily)   Diet/type: Regular consistency (see orders)  DVT prophylaxis: other - TED stocking and bival GI prophylaxis: N/A Lines: Central line and yes and it is still needed; RUE PICC Foley:  N/A Code Status:  full code Last date of multidisciplinary goals of care discussion [ 6/28] Patient updated at bedside on 7/8 with family at bedside  Transferring from ICU to cardiac tele and TRH will assume care CCM will sign off at that time.   care time 35 mins. This represents my time independent of the NPs time taking care of the pt. This is excluding procedures.  Briant Sites DO [facility] 02/16/2021, 9:07 AM See Amion for pager If no response to pager, please call [phone] until 1900 After 1900 please call [facility] [phone]

## 2021-02-16 NOTE — Progress Notes (Signed)
[facility] ADULT ICU REPLACEMENT PROTOCOL   The patient does apply for the [facility] Adult ICU Electrolyte Replacment Protocol based on the criteria listed below:   1. Is GFR >/= 30 ml/min? Yes.    Patient's GFR today is >60 2. Is SCr </= 2? Yes.   Patient's SCr is 0.88 ml/kg/hr 3. Did SCr increase >/= 0.5 in 24 hours? No. 4. Abnormal electrolyte(s):   Mg 1.7 5. Ordered repletion with: protocol 6. If a panic level lab has been reported, has the CCM MD in charge been notified?  Physician:  Ferd Hibbs R Indica Marcott 02/16/2021 5:09 AM

## 2021-02-16 NOTE — Progress Notes (Addendum)
Progress Note  Patient Name: Debra Daniel Date of Encounter: 02/16/2021  [facility] Cardiologist: new to [facility]  Subjective   "Im ok", not SOB at rest, no hemoptysis, no CP  Inpatient Medications    Scheduled Meds:  Chlorhexidine Gluconate Cloth  6 each Topical Daily   dapagliflozin propanediol  10 mg Oral Daily   digoxin  0.125 mg Oral Daily   insulin aspart  0-5 Units Subcutaneous QHS   insulin aspart  0-9 Units Subcutaneous TID WC   levothyroxine  75 mcg Oral Q0600   losartan  25 mg Oral Daily   mouth rinse  15 mL Mouth Rinse BID   sodium chloride flush  10-40 mL Intracatheter Q12H   sodium chloride flush  3 mL Intravenous Q12H   spironolactone  25 mg Oral Daily   torsemide  40 mg Oral BID   Continuous Infusions:  sodium chloride 250 mL (02/15/21 0539)   sodium chloride 10 mL/hr at 02/13/21 1500   sodium chloride Stopped (02/14/21 1329)   bivalirudin (ANGIOMAX) infusion 0.5 mg/mL (Non-ACS indications) 0.1 mg/kg/hr (02/16/21 0600)   ceFEPime (MAXIPIME) IV     PRN Meds: sodium chloride, sodium chloride, acetaminophen, lip balm, sodium chloride flush   Vital Signs    Vitals:   02/16/21 0500 02/16/21 0600 02/16/21 0700 02/16/21 0800  BP: 98/66 99/66 110/82 99/66  Pulse: (!) 107 (!) 105 (!) 108 (!) 108  Resp: (!) 29 (!) 28 (!) 28 (!) 24  Temp:      TempSrc:      SpO2: 100% 95% 100% 100%  Weight: 98.6 kg     Height:        Intake/Output Summary (Last 24 hours) at 02/16/2021 0851 Last data filed at 02/16/2021 0600 Gross per 24 hour  Intake 953.49 ml  Output 1950 ml  Net -996.51 ml   Last 3 Weights 02/16/2021 02/15/2021 02/14/2021  Weight (lbs) 217 lb 6 oz 216 lb 4.3 oz 216 lb 0.8 oz  Weight (kg) 98.6 kg 98.1 kg 98 kg      Telemetry    SR/ST 90's-110s, occ PVCs, rare couplet/NSVT , longest 5 beats - Personally Reviewed  ECG    No new EKGs - Personally Reviewed  Physical Exam   GEN: No acute distress.   Neck: CVP 5 Cardiac: RRR, no murmurs, rubs, or  gallops.  Respiratory: diminished at the bases, very soft exp wheeze GI: Soft, nontender, non-distended  MS: trace edema b/l ankles; No deformity. Neuro:  Nonfocal  Psych: Normal affect   Labs    High Sensitivity Troponin:   Recent Labs  Lab 02/06/21 2008 02/07/21 0226 02/13/21 2139 02/14/21 0520  TROPONINIHS 26* 24* 38* 53*      Chemistry Recent Labs  Lab 02/13/21 2139 02/13/21 2232 02/15/21 0427 02/15/21 1240 02/16/21 0422  NA 134*   < > 135 133* 132*  K 4.7   < > 4.4 3.8 4.0  CL 97*   < > 98 97* 97*  CO2 24   < > 30 28 29   GLUCOSE 150*   < > 110* 146* 133*  BUN 18   < > 17 15 17   CREATININE 1.17*   < > 1.01* 0.92 0.88  CALCIUM 8.1*   < > 8.2* 7.9* 7.8*  PROT 5.4*  --   --   --   --   ALBUMIN 1.9*  --   --   --   --   AST 58*  --   --   --   --  ALT 41  --   --   --   --   ALKPHOS 64  --   --   --   --   BILITOT 2.0*  --   --   --   --   GFRNONAA >60   < > >60 >60 >60  ANIONGAP 13   < > 7 8 6    < > = values in this interval not displayed.     Hematology Recent Labs  Lab 02/14/21 1002 02/14/21 1332 02/15/21 0427 02/16/21 0422  WBC 17.2*  --  17.5* 17.2*  RBC 3.81*  --  3.69* 3.53*  HGB 9.9*  --  9.5* 9.0*  HCT 31.1*  --  30.2* 29.0*  MCV 81.6  --  81.8 82.2  MCH 26.0  --  25.7* 25.5*  MCHC 31.8  --  31.5 31.0  RDW 17.7*  --  17.9* 18.1*  PLT 90* 93* 121* 113*    BNPNo results for input(s): BNP, PROBNP in the last 168 hours.   DDimer No results for input(s): DDIMER in the last 168 hours.   Radiology      Cardiac Studies   02/15/21: c.MRI IMPRESSION: 1.  Moderately dilated LV with EF 18%, diffuse hypokinesis. 2.  LV thrombus noted. 3.  Moderately dilated RV with EF 26%. 4.  MR looks mild by cMRI, regurgitant fraction 15%. 5. Subtle non-coronary mid-wall LGE pattern in the inferior and anterolateral walls, possible prior myocarditis. 6.  Consolidation of the right lung base.     CARDIAC CATHETERIZATION IMPRESSION: 1.  Moderately  dilated LV with EF 18%, diffuse hypokinesis.   2.  LV thrombus noted.   3.  Moderately dilated RV with EF 26%.   4.  MR looks mild by cMRI, regurgitant fraction 15%.   5. Subtle non-coronary mid-wall LGE pattern in the inferior and anterolateral walls, possible prior myocarditis.   6.  Consolidation of the right lung base.  Result Date: 02/14/2021 1. Mildly elevated filling pressures. 2. Pulmonary venous hypertension. 3. Cardiac output ok on milrinone 0.25 4. No significant coronary disease. Nonischemic cardiomyopathy.     02/06/21: TTE IMPRESSIONS   1. Left ventricular ejection fraction, by estimation, is 10-15%. The left  ventricle has severely decreased function. The left ventricle demonstrates  global hypokinesis. The left ventricular internal cavity size was  moderately dilated. Left ventricular  diastolic function could not be evaluated.   2. Right ventricular systolic function is moderate-severely reduced. The  right ventricular size is moderately enlarged. There is moderately  elevated pulmonary artery systolic pressure. The estimated right  ventricular systolic pressure is 45.9 mmHg.   3. Left atrial size was mildly dilated.   4. The mitral valve is grossly normal. Severe, secondary mitral valve  regurgitation with leaflet tenting secondary to LV dysfunction. No  evidence of mitral stenosis.   5. Tricuspid valve regurgitation is severe.   6. The aortic valve is normal in structure. Aortic valve regurgitation is  not visualized.   7. The inferior vena cava is normal in size with <50% respiratory  variability, suggesting right atrial pressure of 8 mmHg.    Patient Profile     35 y.o. female  with a hx of hypothyroidism ( a fairly recent diagnosis) only with a month of malaise, fatigue, suspetc/treated for UTI (with a few rounds of abx in may), subsequently worsening SOB, edema, worsening exertional capacity > hemoptysis > ER in [place] found with PE....  On June 25th,  she began experiencing  hemoptysis and sought ED evaluation 06/27 in [place]. CTA chest abdomen pelvis with acute RLL segmental and subsegmental PE, crazy-paving pattern of consolidation in bilateral lower lobes, diffuse anasarca, no definite pathology in abdomen and pelvis. OCP were stopped. She had worsening hemoptysis with IV heparin which was stopped. Echo demonstrated EF of 25-30%, moderate to severe MR, RVSP 45 mmHg, moderate TR. She was transferred to [facility] for additional pulmonary and cardiac evaluation.   LE venous US negative b/l   Upon arrival to [facility], she was evaluated by Cardiology and stat echo demonstrated LVEF of 10-15% with moderately dilated LV, severe secondary MR, moderate to severe RV dysfunction with moderately enlarged RV, RVSP 45 mmHg, severe TR.   She developed cardiogenic shock and has since been placed on milrinone 0.25 mcg. She was briefly on low-dose norepinephrine which was discontinued shortly after arriving   HF team was consulted for cardiogenic shock and BiVE failure, severe MR probably secondary, unable to anticoagulate for PE with worsening hemoptysis on heparin, on abx for CAP She was tachycardic, described as MAT She developed NSVT episodes as long as 22beats and amiodarone was added to her regime 02/07/21   6/30 started on lasix gtt, hypotensive and midodrine added, NE resumed as needed Continued NSVT episodes Lytes repleted along the way     7/2 hemoptysis resolved with only old clots with cough, diuresing well 02/11/21 back on heparin gtt, feeling better, ambulating Suspect course of events likely viral illness/myocarditis > inactivity, followed by PE and pneumonia. Venous duplex negative for DVT.   7/4 off NE Planned ofr c.MRI and R/LHC when able Described as continued tachycardic, only PVCs no NSVT remained on amio Off antibiotics   7/5 LHC no CAD RA mean 9 RV 52/17 PA 53/17, mean 31 PCWP mean 16 LV 90/14 AO 90/63  Oxygen saturations: PA  57% AO 98%  Cardiac Output (Fick) 5.1  Cardiac Index (Fick) 2.48  PVR 2.9 WU   TSH 13 and synthroid resumed yesterday   02/13/21 EVENING developed PMVT and was defibrillated x2, brief CPR, QT felt to be prolonged and amio stopped Given K and mag and milrinone was reduced   02/14/21 BPs better, midodrine stopped,losartan added, also on ivabradine HF noted" Overall picture concerning, she is someone we may need to consider advanced therapies for" She had another episode of this described as VF with transient CPR and shocked to SR, started on lidocaine gtt 100mg  bolus > 1mg /min Ivabradine stopped (case reports of TdP were found by the pharmacist) Hep gtt stopped with 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 by repeat labs, planned to repeat CBC again and start  bivalirudin (patient has PE) if plts remained low   EP is asked to the case with episodes of Torsades/VF    Assessment & Plan     New (presumably) and severe BiVe failure PMVT 7/5 and 7/6 Cardiogenic shock Remains on milrinone Off NE a couple days antibiotics stopped 7/3 Off proamatine (stopped  02/14/21) Ivabrdine stopped 7/6  (started 7/5) with some case studies of pro arrhythmia  Only PVCs, rare NSVT longest 5 beats, some shorter episodes are multifocal No VT    3. Hemoptysis > PE 4. 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 Agreed QT did appear to get long  Avoid ALL potential QT prolonging medicines Start BB when able Keep K+ towards 5 and Mag >2  Will  need to follow LVEF recovery over time with GDMT suspect plan would be for life vest initially   For questions or updates, please contact [facility] Please consult [email or web address] for contact info under      Signed, Sheilah Pigeon, PA-C  02/16/2021, 8:51 AM    EP Attending  Patient seen and examined. Agree with above. The patient has continued to  improve and has had only one episode of NSVT, brief. CO is improved as well and note plans for transfer to tele. At this point we will sign off. Please call for more VT. Avoid QT prolonging drugs, and hopefully she can start a very low dose of a beta blocker. Keep electrolytes replete with Mg over 2 and potassium close to 5. She will need a repeat echo in 3 months and consider ICD if her EF does not improve. I would recommend a Life Vest for her.   Sharlot Gowda Cythia Bachtel,MD

## 2021-02-16 NOTE — Progress Notes (Signed)
Physical Therapy Treatment/Discharge Patient Details Name: Debra Daniel MRN: [id number] DOB: 05/21/1986 Today's Date: 02/16/2021    History of Present Illness 35 y/o female transferred 6/28 from [facility] with mild hemoptysis, worsening LE edema and Rt sided segmental PE on CT.  Heparin caused significant worsening of her hemoptysis, so now not anticoagulated with transfer to ICU 6/30. Echo demonstrated LVEF of 25-30% with mod-severe mitral regurgitation.  7/5 heart cath with Vfib arrest that evening s/p defibrillation x 2. 7/6 Vtach with shock x 1 back to NSR. PMHx:  hypothyroidism, UTI's    PT Comments    Pt ambulated 426f on room air (spO2 100). Pt demonstrated independence compared to previous visit and did not require an AD when ambulating. Pt met acute care PT goals and agrees to discharge. Provided education on walking program following discharge.  Follow Up Recommendations  No PT follow up     Equipment Recommendations  None recommended by PT    Recommendations for Other Services       Precautions / Restrictions      Mobility  Bed Mobility Overal bed mobility: Modified Independent             General bed mobility comments: Pt seated in recliner prior to PT. Resting vitals: HR 111 BP 100/74 spO2 100. Modified independent. Needs assistance for lines/leads.    Transfers Overall transfer level: Modified independent Equipment used: None Transfers: Sit to/from Stand Sit to Stand: Supervision         General transfer comment: Supervision for lines/leads  Ambulation/Gait Ambulation/Gait assistance: Supervision Gait Distance (Feet): 400 Feet Assistive device: None Gait Pattern/deviations: Step-through pattern;WFL(Within Functional Limits)   Gait velocity interpretation: >2.62 ft/sec, indicative of community ambulatory General Gait Details: Pt ambulated on room air, spO2 100. HR 111-125 during ambulation. Pt demonstrated step through pattern without  AD.   Stairs             Wheelchair Mobility    Modified Rankin (Stroke Patients Only)       Balance                                            Cognition Arousal/Alertness: Awake/alert Behavior During Therapy: WFL for tasks assessed/performed Overall Cognitive Status: Within Functional Limits for tasks assessed                                        Exercises      General Comments        Pertinent Vitals/Pain      Home Living                      Prior Function            PT Goals (current goals can now be found in the care plan section) Progress towards PT goals: Goals met/education completed, patient discharged from PT    Frequency    Min 3X/week      PT Plan Current plan remains appropriate    Co-evaluation              AM-PAC PT "6 Clicks" Mobility   Outcome Measure  Help needed turning from your back to your side while in a flat bed without using bedrails?: None Help needed moving from  lying on your back to sitting on the side of a flat bed without using bedrails?: None Help needed moving to and from a bed to a chair (including a wheelchair)?: None Help needed standing up from a chair using your arms (e.g., wheelchair or bedside chair)?: None Help needed to walk in hospital room?: None Help needed climbing 3-5 steps with a railing? : None 6 Click Score: 24    End of Session Equipment Utilized During Treatment: Gait belt Activity Tolerance: Patient tolerated treatment well Patient left: with call bell/phone within reach;with family/visitor present;in bed   PT Visit Diagnosis: Muscle weakness (generalized) (M62.81);Difficulty in walking, not elsewhere classified (R26.2);Other abnormalities of gait and mobility (R26.89)     Time: 8937-3428 PT Time Calculation (min) (ACUTE ONLY): 20 min  Charges:  $Gait Training: 8-22 mins                     Louie Casa, SPT Acute Rehab: [phone]    Domingo Dimes 02/16/2021, 9:46 AM

## 2021-02-16 NOTE — Progress Notes (Signed)
NAME:  Debra Daniel, MRN:  [id number], DOB:  [birth year], LOS: 10 ADMISSION DATE:  02/06/2021, CONSULTATION DATE:  6/28 REFERRING MD:  Dr. Adela Glimpse, CHIEF COMPLAINT:  hemoptysis   History of Present Illness:  35 year old female with no significant past medical history up until quite recently. Three months prior to admission she was diagnosed with hypothyroid and was started on synthroid. Then, approximately around 12/22/20 she was evaluated with recurrent N/V and UTI symptoms. She is unclear what medications she was treated with, although she remembers the first one was ineffective and a second antibiotic was needed. Progressive lower extremity edema since that time as well. Then 6/25 she began to experience mild hemoptysis, which prompted her to present to [facility] 6/27. Workup in the ED included CTA chest and CT abdomen/pelvis. She was found to have right sided segmental PE as well a crazy paving pattern on the CT chest. CT abdomen was non-acute. She was admitted. Started on heparin for PE, which caused her hemoptysis to become much worse. Heparin was stopped. Echocardiogram done demonstrated LVEF 25-30% as well as mod-severe mitral regurgitation. She was transferred to [facility] for further evaluation.   Pertinent  Medical History   has a past medical history of Encounter for menstrual regulation (01/24/2015), Hypothyroid, Migraines, Obesity, and UTI (urinary tract infection).    has a past medical history of Encounter for menstrual regulation (01/24/2015), Hypothyroid, Migraines, Obesity, and UTI (urinary tract infection).   reports that she has never smoked. She has never used smokeless tobacco.   Significant Hospital Events: Including procedures, antibiotic start and stop dates in addition to other pertinent events   6/27 admit to [place] for hemotpysis. Dx PE. Worsening hemoptysis  6/28 tx [facility] > then to ICU due to hypotension, hemoptysis.  6/29 milrinone, NE, UOP  picking up, PICC placed, MAT on tele, added digoxin and spiro per HF, amio added for NSVT, NE off 6/30 hemoptysis improving but present, Cr better, feels better, on milrinone, added lasix gtt 7/1 going to add some txa nebs, started on midodrine  7/2 no further hemoptysis and no SCVO2 better. Started on low dose heparin 7/3 on 2L, walked around unit, continues on lasix gtt, CVP 12, remains on milrinone 0.375, abx stopped; txa nebs complete 7/4 lasix gtt stopped, metolazone x 1, CVP 7-8, hemoptysis resolved, abx, remains on milrinone, on room air  7/5 Vfib arrest-defib x2; CPR 4 minutes to ROSC; then patient alert and interactive; EKG with prolonged Qtc (beyond 500 ms); stopped amio and given mag; low calcium-given calcium gluconate 7/6- 1 Episode of torsades in am. One shock was given. Back to NSR w/ ectopy. Lidocaine started.   Ivabradine stopped. Heparin stopped due to low platelets and started on bivalirudin. 7/8- discontinue bivalirudin and start apixiban; transferred from ICU to cardiac tele floor   6/28 MRSA neg 6/28 RVP neg 7/5 SARS neg 6/28 urine strep and legionella neg  6/28 vanc 6/28 cefepime >>6/29 6/29 ceftriaxone > 7/3 6/29 doxycycline> 7/1  6/29 CT chest >> Areas of consolidation in the right lower lobe, left lower lobe and right middle lobe compatible with multifocal pneumonia.   6/29 LE dopplers >>neg for DVT  Interim History / Subjective:   Heparin stopped due to low platelets and started on Bivalirudin.  This morning, patient sitting up in chair eating breakfast; no complaints.  Mag 1.7 this am; Elink replaced UO -2 L Afebrile/WBC 17.2 CVP 5/coox 59.7% (from 50%) Off lidocaine and Milrinone  Objective  Blood pressure 99/66, pulse (!) 108, temperature 97.8 F (36.6 C), temperature source Oral, resp. rate (!) 24, height 5' 4.5" (1.638 m), weight 98.6 kg, last menstrual period 10/24/2020, SpO2 100 %. CVP:  [4 mmHg-16 mmHg] 4 mmHg      Intake/Output Summary  (Last 24 hours) at 02/16/2021 0916 Last data filed at 02/16/2021 0600 Gross per 24 hour  Intake 901.29 ml  Output 1950 ml  Net -1048.71 ml    Filed Weights   02/14/21 0500 02/15/21 0500 02/16/21 0500  Weight: 98 kg 98.1 kg 98.6 kg    Examination: General:  Pale appearing female up in chair in NAD HEENT: MM pink/moist; [place] in place Neuro: Aox4 CV: s1s2, RRR, no m/r/g PULM:  dim bs lower lobes bilaterally GI: soft, bsx4 active    LABS   K 4.0, Mag 1.7 Creat 0.88 (from 1.01) WBC 17.2 (from 17.5) Hgb 9.0 Platelets 113 (from 121)   Resolved Hospital Problem list   AGMA Hyponatremia  Assessment & Plan:   Acute systolic HF with cardiogenic shock/ cardiomyopathy with acute biventricular heart failure: new dx this admit. Echo 6/28: EF 10-15%. mildly dilated LV, moderate to severely reduced RV fxn with mildly dilated RV, RVSP 45 mmHg, severe secondary MR, mod-severe TR. Recent hypothyroidism dx.  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 Mitral valve regurgitation (normal valves on TTE)  Moderate- severe TR  P: -Appreciate HF input -D/C milrinone and Lidocaine -start dapagliflozin 10 mg -trend coox and cvp -trend electrolytes and UOP -continue digoxin 0.125 mg qd -continue torsemide 40 mg BID -increase losartan 25 mg qd -continue spironolactone 35 mg qd -strict I/Os; daily weights  NSVT: 7/5 episode of torsades de pointes vfib arrest x 4 minutes MAT P: -Per HF -continue to trend electrolytes and replete K/mag for K goal >4, Mag goal > 2 -Mg 1.7 today --> replace with 4g IV -avoid qtc prolonging meds  Hemoptysis (?2/2 to PE vs elevated LA pressures, s/p txa nebs 7/1-7/3 CAP : abx completed 7/3 RLL PE (on [place] CTA), neg LE dopplers 6/29 P: -D/C bival -start apixiban 10 mg BID -hold heparin due to c/f HIT (Positive for heparin induced platelet antibodies 7/6) -pending serotonin release assay -OOB as tolerated/ PT/ pulm hygiene -off [place]  (O2 sat ~96%) -persistent coughing and hemoptysis with abnormal colored sputum and persistent leukocytosis; culture sputum and start cefepime.  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 GI prophylaxis: N/A Lines: Central line and yes and it is still needed; RUE PICC Foley:  N/A Code Status:  full code Last date of multidisciplinary goals of care discussion [ 6/28] Patient updated at bedside on 7/7   Marta Lamas, M.S. Medical Student [facility]

## 2021-02-16 NOTE — Progress Notes (Addendum)
Patient ID: Debra Daniel, female   DOB: 01/18/1986, 35 y.o.   MRN: [id number]     Advanced Heart Failure Rounding Note  PCP-Cardiologist: None   Subjective:   -05/13: Seen in ED for recurrent nausea and vomiting. Treated for possible UTI. Ongoing weakness, fatigue, intermittent N/V, cough since. -06/27: ED at OSH with hemoptysis. Found to have segmental PE and pneumonia. EF 25-30%. -06/28: Transferred to [facility]. Cardiogenic shock > milrinone 0.25 mcg. Diuresed with IV lasix. -06/29: Tachycardic. MAT noted on tele. Added digoxin and spiro. Amio added d/t NSVT. Last dose IV lasix. Norepi off. - 6/30: midodrine started and lasix drip started.  - 7/5: RHC/LHC as below.  Around 9 pm, developed PMVT and required defibrillation x 2 with brief CPR.  QTc prolonged, amiodarone was stopped.  She received Mg and K. Milrinone decreased to 0.125.  She had another episode PMVT, Corlanor stopped.   I/Os mildly negative, CVP 5 today.  Co-ox 60% on milrinone 0.125.   HIT positive, on bivalirudin. Plts 113 today.  Has RLL PE.   No further PMVT, now off lidocaine.   Dense consolidation at right base on cMRI, WBCs remain 17 but afebrile.  Coughing up some green, blood-tinged sputum.   Walked around unit this morning already. Denies dyspnea.   Cardiac MRI: 1.  Moderately dilated LV with EF 18%, diffuse hypokinesis. 2.  LV thrombus noted. 3.  Moderately dilated RV with EF 26%. 4.  MR looks mild by cMRI, regurgitant fraction 15%. 5. Subtle non-coronary mid-wall LGE pattern in the inferior and anterolateral walls, possible prior myocarditis. 6.  Consolidation of the right lung base.  Echo 02/06/2021: LVEF 10-15%, LV moderately dilated, RV systolic function severely reduced, RV moderately enlarged, RVSP 45.9 mmHg, severe secondary MR, mod-severe TR (personally reviewed).  LHC/RHC (7/5):  Left Main  Vessel was injected. Vessel is normal in caliber. Vessel is angiographically normal.  Left Anterior  Descending  Vessel was injected. Vessel is normal in caliber. Vessel is angiographically normal.  Left Circumflex  Vessel was injected. Vessel is normal in caliber. Vessel is angiographically normal.  Right Coronary Artery  Vessel was injected. Vessel is normal in caliber. Vessel is angiographically normal.   Intervention   No interventions have been documented.    Right Heart  Right Heart Pressures RHC Procedural Findings (on milrinone 0.25): Hemodynamics (mmHg) RA mean 9 RV 52/17 PA 53/17, mean 31 PCWP mean 16 LV 90/14 AO 90/63  Oxygen saturations: PA 57% AO 98%  Cardiac Output (Fick) 5.1  Cardiac Index (Fick) 2.48  PVR 2.9 WU    Objective:   Weight Range: 98.6 kg Body mass index is 36.74 kg/m.   Vital Signs:   Temp:  [97.7 F (36.5 C)-98.9 F (37.2 C)] 97.8 F (36.6 C) (07/08 0454) Pulse Rate:  [98-115] 108 (07/08 0700) Resp:  [17-36] 28 (07/08 0700) BP: (90-118)/(60-88) 110/82 (07/08 0700) SpO2:  [93 %-100 %] 100 % (07/08 0700) Weight:  [98.6 kg] 98.6 kg (07/08 0500) Last BM Date: 02/13/21  Weight change: Filed Weights   02/14/21 0500 02/15/21 0500 02/16/21 0500  Weight: 98 kg 98.1 kg 98.6 kg    Intake/Output:   Intake/Output Summary (Last 24 hours) at 02/16/2021 0735 Last data filed at 02/16/2021 0600 Gross per 24 hour  Intake 1073.49 ml  Output 1950 ml  Net -876.51 ml      Physical Exam   CVP 5 General: NAD Neck: No JVD, no thyromegaly or thyroid nodule.  Lungs: Decreased BS right base.  CV: Nondisplaced PMI.  Heart regular S1/S2, no S3/S4, no murmur.  Trace ankle edema.  Abdomen: Soft, nontender, no hepatosplenomegaly, no distention.  Skin: Intact without lesions or rashes.  Neurologic: Alert and oriented x 3.  Psych: Normal affect. Extremities: No clubbing or cyanosis.  HEENT: Normal.     Telemetry  ST 100s-110s (personally reviewed)  Labs    CBC Recent Labs    02/15/21 0427 02/16/21 0422  WBC 17.5* 17.2*  HGB 9.5*  9.0*  HCT 30.2* 29.0*  MCV 81.8 82.2  PLT 121* 113*   Basic Metabolic Panel Recent Labs    57/26/20 1240 02/16/21 0422  NA 133* 132*  K 3.8 4.0  CL 97* 97*  CO2 28 29  GLUCOSE 146* 133*  BUN 15 17  CREATININE 0.92 0.88  CALCIUM 7.9* 7.8*  MG 2.0 1.7   Liver Function Tests Recent Labs    02/13/21 2139  AST 58*  ALT 41  ALKPHOS 64  BILITOT 2.0*  PROT 5.4*  ALBUMIN 1.9*    No results for input(s): LIPASE, AMYLASE in the last 72 hours. Cardiac Enzymes No results for input(s): CKTOTAL, CKMB, CKMBINDEX, TROPONINI in the last 72 hours.  BNP: BNP (last 3 results) Recent Labs    02/06/21 2018  BNP 1,571.8*    ProBNP (last 3 results) No results for input(s): PROBNP in the last 8760 hours.   D-Dimer No results for input(s): DDIMER in the last 72 hours.  Hemoglobin A1C No results for input(s): HGBA1C in the last 72 hours.  Fasting Lipid Panel No results for input(s): CHOL, HDL, LDLCALC, TRIG, CHOLHDL, LDLDIRECT in the last 72 hours. Thyroid Function Tests No results for input(s): TSH, T4TOTAL, T3FREE, THYROIDAB in the last 72 hours.  Invalid input(s): FREET3   Other results:   Imaging    MR CARDIAC MORPHOLOGY W WO CONTRAST  Result Date: 02/15/2021 CLINICAL DATA:  Cardiomyopathy of uncertain etiology. EXAM: CARDIAC MRI TECHNIQUE: The patient was scanned on a 1.5 Tesla GE magnet. A dedicated cardiac coil was used. Functional imaging was done using Fiesta sequences. 2,3, and 4 chamber views were done to assess for RWMA's. Modified Simpson's rule using a short axis stack was used to calculate an ejection fraction on a dedicated work [employer]. The patient received 8 cc of Gadavist. After 10 minutes inversion recovery sequences were used to assess for infiltration and scar tissue. FINDINGS: Right lung base consolidation noted.  Small right pleural effusion. Moderately dilated left ventricle with normal wall thickness, diffuse hypokinesis with EF  18%. There is a left ventricular apical thrombus, 1.5 cm. Moderately dilated right ventricle with RV EF 26%. Mild biatrial enlargement. Trileaflet aortic valve with no significant stenosis or regurgitation. Mild mitral regurgitation, regurgitant fraction 15%. Delayed enhancement images are difficult. Possible subtle mid-wall late gadolinium enhancement (LGE) in the mid inferior wall and the mid anterolateral wall. Measurements: LVEDV 302 mL LVSV 53 mL LVEF 18% RVEDV 208 mL RVSV 54 mL RVEF 26% Aortic forward volume 45 mL Mitral regurgitant fraction 15% IMPRESSION: 1.  Moderately dilated LV with EF 18%, diffuse hypokinesis. 2.  LV thrombus noted. 3.  Moderately dilated RV with EF 26%. 4.  MR looks mild by cMRI, regurgitant fraction 15%. 5. Subtle non-coronary mid-wall LGE pattern in the inferior and anterolateral walls, possible prior myocarditis. 6.  Consolidation of the right lung base. Virdia Ziesmer Electronically Signed   By: Marca Ancona M.D.   On: 02/15/2021 18:15     Medications:  Scheduled Medications:  Chlorhexidine Gluconate Cloth  6 each Topical Daily   dapagliflozin propanediol  10 mg Oral Daily   digoxin  0.125 mg Oral Daily   insulin aspart  0-5 Units Subcutaneous QHS   insulin aspart  0-9 Units Subcutaneous TID WC   levothyroxine  75 mcg Oral Q0600   losartan  25 mg Oral Daily   mouth rinse  15 mL Mouth Rinse BID   sodium chloride flush  10-40 mL Intracatheter Q12H   sodium chloride flush  3 mL Intravenous Q12H   spironolactone  25 mg Oral Daily   torsemide  40 mg Oral BID    Infusions:  sodium chloride 250 mL (02/15/21 0539)   sodium chloride 10 mL/hr at 02/13/21 1500   sodium chloride Stopped (02/14/21 1329)   bivalirudin (ANGIOMAX) infusion 0.5 mg/mL (Non-ACS indications) 0.1 mg/kg/hr (02/16/21 0600)   ceFEPime (MAXIPIME) IV      PRN Medications: sodium chloride, sodium chloride, acetaminophen, lip balm, sodium chloride flush    Assessment/Plan   Acute  systolic HF -> Cardiogenic shock/acute biventricular heart failure/new cardiomyopathy -New diagnosis of CHF this admission after presenting with hemoptysis at OSH in [place]. Suspected viral illness with > 1 month hx weakness, fatigue, cough, N/V. HIV negative. Recently diagnosed with hypothyroidism.  Etiology => viral myocarditis versus familial cardiomyopathy (mother and aunts/uncles with CHF).  Coronary angiography was normal this admission.   - Echo here with EF of 10-15%, mildly dilated LV, moderate to severely reduced RV fxn with mildly dilated RV, RVSP 45 mmHg, severe secondary MR, mod-severe TR. - cMRI with LV EF 18%, RV EF 26%, LV thrombus, Subtle non-coronary mid-wall LGE pattern in the inferior and anterolateral walls, possible prior myocarditis. - BNP 1,571. Pregnancy test negative.  - Currently on 0.125 mcg milrinone, off NE.  RHC with CI 2.48 on milrinone 0.25.  Co-ox this morning 60%.  I will stop milrinone today.  - CVP 5, continue torsemide 40 mg bid.  - Now off midodrine.  - Increase losartan to 25 mg daily, eventually would like to transition to [place].  - Add dapagliflozin 10 daily.  - Continue spironolactone 25 mg daily.   - Continue digoxin, level 0.4 recently.  - Off ivabradine with PMVT.  - No beta blocker due to shock. - Overall picture concerning, she is someone we may need to consider advanced therapies for.   2. Mitral valve regurgitation - Severe on echo this admission, only looked mild on cMRI however.  - Likely secondary, valve does not look abnormal on echo.   3. Moderate-severe TR - Likely secondary  4. NSVT => episodes of PMVT - She had been on amiodarone gtt for NSVT, prolonged QTc and developed PMVT.  Now off amiodarone and Corlanor and on lower dose of milrinone.  Transiently on lidocaine.  No further PMVT.   - Stop milrinone today.  - Keep K > 4, Mg > 2.   5. Hemoptysis/pneumonia - Possible PE noted on imaging at OSH. Suspect course of events  likely viral illness/myocarditis > inactivity, followed by PE and pneumonia. Venous duplex negative for DVT. - CT chest here with consolidation both lower lobes. Ground glass opacities right middle lobe. - Was on ceftriaxone, now stopped by CCM.  - Suspect hemoptysis was due to elevated left atrial pressure.  - WBC 17.0. > 19.6> 19 > 17.9 > 17.3 > 17.6 > 17.5 > 17 => persistent elevation.  - With ongoing leukocytosis and dense right base consolidation on cMRI as  well as green sputum, sent sputum culture and will cover for HCAP with cefepime. MRSA nasal swab was negative.   6. RLL PE - Seen on [facility] at [facility] ([facility]A chest).  - On bivalirudin gtt with HIT, eventually transition to Eliquis.   6. Hypothyroidism -Recently diagnosed. -TSH 13.5/Free T4 1.39. -On synthroid PTA.   7. Elevated INR - Possibly due to passive congestion from CHF. - Given Vit K 6/29, 6/30, and 7/1 per CCM.  8. Hyponatremia - Resolved.  - Restrict free water  9. Acute blood loss anemia -Hgb 12.6 > 13.5 > 9.8> 9 > 10 > 9.4 > 9.6 > 9.9 > 9.5 > 9.   -Presented with massive hemoptysis which is now improved, suspect due primarily to elevated LA pressure.  10. 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  Patient is currently on bivalirudin, plts 113 today.   11. LV thrombus - On anticoagulation  Ambulate today  CRITICAL CARE Performed by: Marca Ancona  Total critical care time: 40 minutes  Critical care time was exclusive of separately billable procedures and treating other patients.  Critical care was necessary to treat or prevent imminent or life-threatening deterioration.  Critical care was time spent personally by me (independent of midlevel providers or residents) on the following activities: development of treatment plan with patient and/or surrogate as well as nursing, discussions with consultants, evaluation of patient's response to treatment, examination of patient, obtaining history from patient or surrogate,  ordering and performing treatments and interventions, ordering and review of laboratory studies, ordering and review of radiographic studies, pulse oximetry and re-evaluation of patient's condition.  Marca Ancona, MD  7:35 AM 02/16/2021

## 2021-02-17 LAB — CBC
HCT: 29.4 % — ABNORMAL LOW (ref 36.0–46.0)
Hemoglobin: 9.3 g/dL — ABNORMAL LOW (ref 12.0–15.0)
MCH: 25.8 pg — ABNORMAL LOW (ref 26.0–34.0)
MCHC: 31.6 g/dL (ref 30.0–36.0)
MCV: 81.4 fL (ref 80.0–100.0)
Platelets: 154 10*3/uL (ref 150–400)
RBC: 3.61 MIL/uL — ABNORMAL LOW (ref 3.87–5.11)
RDW: 18.5 % — ABNORMAL HIGH (ref 11.5–15.5)
WBC: 19.6 10*3/uL — ABNORMAL HIGH (ref 4.0–10.5)
nRBC: 0 % (ref 0.0–0.2)

## 2021-02-17 LAB — POTASSIUM
Potassium: 3.7 mmol/L (ref 3.5–5.1)
Potassium: 4.1 mmol/L (ref 3.5–5.1)

## 2021-02-17 LAB — COMPREHENSIVE METABOLIC PANEL
ALT: 39 U/L (ref 0–44)
AST: 44 U/L — ABNORMAL HIGH (ref 15–41)
Albumin: 1.8 g/dL — ABNORMAL LOW (ref 3.5–5.0)
Alkaline Phosphatase: 60 U/L (ref 38–126)
Anion gap: 9 (ref 5–15)
BUN: 17 mg/dL (ref 6–20)
CO2: 28 mmol/L (ref 22–32)
Calcium: 7.9 mg/dL — ABNORMAL LOW (ref 8.9–10.3)
Chloride: 97 mmol/L — ABNORMAL LOW (ref 98–111)
Creatinine, Ser: 0.88 mg/dL (ref 0.44–1.00)
GFR, Estimated: >60 mL/min (ref >60)
Glucose, Bld: 99 mg/dL (ref 70–99)
Potassium: 3.7 mmol/L (ref 3.5–5.1)
Sodium: 134 mmol/L — ABNORMAL LOW (ref 135–145)
Total Bilirubin: 1.9 mg/dL — ABNORMAL HIGH (ref 0.3–1.2)
Total Protein: 4.8 g/dL — ABNORMAL LOW (ref 6.5–8.1)

## 2021-02-17 LAB — COOXEMETRY PANEL
Carboxyhemoglobin: 1.6 % — ABNORMAL HIGH (ref 0.5–1.5)
Methemoglobin: 0.6 % (ref 0.0–1.5)
O2 Saturation: 63.9 %
Total hemoglobin: 9.5 g/dL — ABNORMAL LOW (ref 12.0–16.0)

## 2021-02-17 LAB — ANTINUCLEAR ANTIBODIES, IFA: ANA Ab, IFA: NEGATIVE

## 2021-02-17 LAB — GLUCOSE, CAPILLARY
Glucose-Capillary: 95 mg/dL (ref 70–99)
Glucose-Capillary: 106 mg/dL — ABNORMAL HIGH (ref 70–99)
Glucose-Capillary: 84 mg/dL (ref 70–99)

## 2021-02-17 LAB — MAGNESIUM: Magnesium: 2 mg/dL (ref 1.7–2.4)

## 2021-02-17 LAB — PROCALCITONIN: Procalcitonin: 0.31 ng/mL

## 2021-02-17 MED ORDER — VANCOMYCIN HCL 1750 MG/350ML IV SOLN
1750.0000 mg | INTRAVENOUS | Status: AC
  Administered 2021-02-18 – 2021-02-21 (×4): 1750 mg via INTRAVENOUS
  Filled 2021-02-17 (×6): qty 350

## 2021-02-17 MED ORDER — POTASSIUM CHLORIDE CRYS ER 20 MEQ PO TBCR
40.0000 meq | EXTENDED_RELEASE_TABLET | Freq: Once | ORAL | Status: AC
  Administered 2021-02-17: 40 meq via ORAL
  Filled 2021-02-17: qty 2

## 2021-02-17 MED ORDER — VANCOMYCIN HCL 2000 MG/400ML IV SOLN
2000.0000 mg | Freq: Once | INTRAVENOUS | Status: AC
  Administered 2021-02-17: 2000 mg via INTRAVENOUS
  Filled 2021-02-17: qty 400

## 2021-02-17 NOTE — Plan of Care (Signed)
  Problem: Education: Goal: Knowledge of General Education information will improve Description: Including pain rating scale, medication(s)/side effects and non-pharmacologic comfort measures Outcome: Progressing   Problem: Health Behavior/Discharge Planning: Goal: Ability to manage health-related needs will improve Outcome: Progressing   Problem: Clinical Measurements: Goal: Ability to maintain clinical measurements within normal limits will improve Outcome: Progressing Goal: Will remain free from infection Outcome: Progressing Goal: Diagnostic test results will improve Outcome: Progressing Goal: Respiratory complications will improve Outcome: Progressing Goal: Cardiovascular complication will be avoided Outcome: Progressing   Problem: Activity: Goal: Risk for activity intolerance will decrease Outcome: Progressing   Problem: Nutrition: Goal: Adequate nutrition will be maintained Outcome: Progressing   Problem: Coping: Goal: Level of anxiety will decrease Outcome: Progressing   Problem: Elimination: Goal: Will not experience complications related to bowel motility Outcome: Progressing Goal: Will not experience complications related to urinary retention Outcome: Progressing   Problem: Pain Managment: Goal: General experience of comfort will improve Outcome: Progressing   Problem: Safety: Goal: Ability to remain free from injury will improve Outcome: Progressing   Problem: Skin Integrity: Goal: Risk for impaired skin integrity will decrease Outcome: Progressing   Problem: Cardiac: Goal: Ability to achieve and maintain adequate cardiopulmonary perfusion will improve Outcome: Progressing Goal: Vascular access site(s) Level 0-1 will be maintained Outcome: Progressing   Problem: Fluid Volume: Goal: Ability to achieve a balanced intake and output will improve Outcome: Progressing   Problem: Physical Regulation: Goal: Complications related to the disease  process, condition or treatment will be avoided or minimized Outcome: Progressing   Problem: Respiratory: Goal: Will regain and/or maintain adequate ventilation Outcome: Progressing   Problem: Education: Goal: Ability to demonstrate management of disease process will improve Outcome: Progressing Goal: Ability to verbalize understanding of medication therapies will improve Outcome: Progressing Goal: Individualized Educational Video(s) Outcome: Progressing   Problem: Activity: Goal: Capacity to carry out activities will improve Outcome: Progressing   Problem: Cardiac: Goal: Ability to achieve and maintain adequate cardiopulmonary perfusion will improve Outcome: Progressing

## 2021-02-17 NOTE — Plan of Care (Signed)
  Problem: Education: Goal: Knowledge of General Education information will improve Description: Including pain rating scale, medication(s)/side effects and non-pharmacologic comfort measures Outcome: Progressing   Problem: Health Behavior/Discharge Planning: Goal: Ability to manage health-related needs will improve Outcome: Progressing   Problem: Clinical Measurements: Goal: Ability to maintain clinical measurements within normal limits will improve Outcome: Progressing Goal: Will remain free from infection Outcome: Progressing Goal: Diagnostic test results will improve Outcome: Progressing Goal: Respiratory complications will improve Outcome: Progressing Goal: Cardiovascular complication will be avoided Outcome: Progressing   Problem: Activity: Goal: Risk for activity intolerance will decrease Outcome: Progressing   Problem: Nutrition: Goal: Adequate nutrition will be maintained Outcome: Progressing   Problem: Coping: Goal: Level of anxiety will decrease Outcome: Progressing   Problem: Elimination: Goal: Will not experience complications related to bowel motility Outcome: Progressing Goal: Will not experience complications related to urinary retention Outcome: Progressing   Problem: Pain Managment: Goal: General experience of comfort will improve Outcome: Progressing   Problem: Safety: Goal: Ability to remain free from injury will improve Outcome: Progressing   Problem: Skin Integrity: Goal: Risk for impaired skin integrity will decrease Outcome: Progressing   Problem: Cardiac: Goal: Ability to achieve and maintain adequate cardiopulmonary perfusion will improve Outcome: Progressing Goal: Vascular access site(s) Level 0-1 will be maintained Outcome: Progressing   Problem: Fluid Volume: Goal: Ability to achieve a balanced intake and output will improve Outcome: Progressing   Problem: Physical Regulation: Goal: Complications related to the disease  process, condition or treatment will be avoided or minimized Outcome: Progressing   Problem: Respiratory: Goal: Will regain and/or maintain adequate ventilation Outcome: Progressing   Problem: Education: Goal: Ability to demonstrate management of disease process will improve Outcome: Progressing Goal: Ability to verbalize understanding of medication therapies will improve Outcome: Progressing Goal: Individualized Educational Video(s) Outcome: Progressing   Problem: Activity: Goal: Capacity to carry out activities will improve Outcome: Progressing   Problem: Cardiac: Goal: Ability to achieve and maintain adequate cardiopulmonary perfusion will improve Outcome: Progressing   

## 2021-02-17 NOTE — Care Management (Signed)
Spoke w Alvino Chapel w [employer] [facility] re referral.  Obtained DME order from MD. Faxed needed info to [phone] per Ellen's request

## 2021-02-17 NOTE — Progress Notes (Signed)
Patient ID: Debra Daniel, female   DOB: 1985-12-29, 35 y.o.   MRN: [id number]     Advanced Heart Failure Rounding Note  PCP-Cardiologist: None   Subjective:   -05/13: Seen in ED for recurrent nausea and vomiting. Treated for possible UTI. Ongoing weakness, fatigue, intermittent N/V, cough since. -06/27: ED at OSH with hemoptysis. Found to have segmental PE and pneumonia. EF 25-30%. -06/28: Transferred to [facility]. Cardiogenic shock > milrinone 0.25 mcg. Diuresed with IV lasix. -06/29: Tachycardic. MAT noted on tele. Added digoxin and spiro. Amio added d/t NSVT. Last dose IV lasix. Norepi off. - 6/30: midodrine started and lasix drip started.  - 7/5: RHC/LHC as below.  Around 9 pm, developed PMVT and required defibrillation x 2 with brief CPR.  QTc prolonged, amiodarone was stopped.  She received Mg and K. Milrinone decreased to 0.125.  She had another episode PMVT, Corlanor stopped.   I/Os negative, weight down.  CVP 8 this morning.   Co-ox 64% now off milrinone.   HIT positive, on apixaban. Plts 154 today.  Has RLL PE.   No further PMVT, now off lidocaine.   Dense consolidation at right base on cMRI, WBCs higher at 19 but afebrile, PCT 0.31.  Decreased cough.  Sputum culture with GPCs.   Denies dyspnea.   Cardiac MRI: 1.  Moderately dilated LV with EF 18%, diffuse hypokinesis. 2.  LV thrombus noted. 3.  Moderately dilated RV with EF 26%. 4.  MR looks mild by cMRI, regurgitant fraction 15%. 5. Subtle non-coronary mid-wall LGE pattern in the inferior and anterolateral walls, possible prior myocarditis. 6.  Consolidation of the right lung base.  Echo 02/06/2021: LVEF 10-15%, LV moderately dilated, RV systolic function severely reduced, RV moderately enlarged, RVSP 45.9 mmHg, severe secondary MR, mod-severe TR (personally reviewed).  LHC/RHC (7/5):  Left Main  Vessel was injected. Vessel is normal in caliber. Vessel is angiographically normal.  Left Anterior Descending  Vessel was  injected. Vessel is normal in caliber. Vessel is angiographically normal.  Left Circumflex  Vessel was injected. Vessel is normal in caliber. Vessel is angiographically normal.  Right Coronary Artery  Vessel was injected. Vessel is normal in caliber. Vessel is angiographically normal.   Intervention   No interventions have been documented.    Right Heart  Right Heart Pressures RHC Procedural Findings (on milrinone 0.25): Hemodynamics (mmHg) RA mean 9 RV 52/17 PA 53/17, mean 31 PCWP mean 16 LV 90/14 AO 90/63  Oxygen saturations: PA 57% AO 98%  Cardiac Output (Fick) 5.1  Cardiac Index (Fick) 2.48  PVR 2.9 WU    Objective:   Weight Range: 95.1 kg Body mass index is 35.43 kg/m.   Vital Signs:   Temp:  [98.1 F (36.7 C)-99.2 F (37.3 C)] 98.7 F (37.1 C) (07/09 0400) Pulse Rate:  [96-117] 115 (07/09 0800) Resp:  [23-43] 24 (07/09 0800) BP: (84-105)/(50-77) 105/65 (07/09 0600) SpO2:  [95 %-100 %] 97 % (07/09 0800) Weight:  [95.1 kg] 95.1 kg (07/09 0500) Last BM Date: 02/16/21  Weight change: Filed Weights   02/15/21 0500 02/16/21 0500 02/17/21 0500  Weight: 98.1 kg 98.6 kg 95.1 kg    Intake/Output:   Intake/Output Summary (Last 24 hours) at 02/17/2021 0904 Last data filed at 02/17/2021 0600 Gross per 24 hour  Intake 553.28 ml  Output 1700 ml  Net -1146.72 ml      Physical Exam   CVP 8 General: NAD Neck: No JVD, no thyromegaly or thyroid nodule.  Lungs: Clear to auscultation  bilaterally with normal respiratory effort. CV: Lateral PMI.  Heart regular S1/S2, no S3/S4, no murmur.  1+ ankle edema.  Abdomen: Soft, nontender, no hepatosplenomegaly, no distention.  Skin: Intact without lesions or rashes.  Neurologic: Alert and oriented x 3.  Psych: Normal affect. Extremities: No clubbing or cyanosis.  HEENT: Normal.     Telemetry  ST 100s-110s (personally reviewed)  Labs    CBC Recent Labs    02/16/21 0422 02/17/21 0409  WBC 17.2* 19.6*  HGB  9.0* 9.3*  HCT 29.0* 29.4*  MCV 82.2 81.4  PLT 113* 154   Basic Metabolic Panel Recent Labs    09/32/35 0422 02/17/21 0409  NA 132* 134*  K 4.0 3.7  CL 97* 97*  CO2 29 28  GLUCOSE 133* 99  BUN 17 17  CREATININE 0.88 0.88  CALCIUM 7.8* 7.9*  MG 1.7 2.0   Liver Function Tests Recent Labs    02/17/21 0409  AST 44*  ALT 39  ALKPHOS 60  BILITOT 1.9*  PROT 4.8*  ALBUMIN 1.8*    No results for input(s): LIPASE, AMYLASE in the last 72 hours. Cardiac Enzymes No results for input(s): CKTOTAL, CKMB, CKMBINDEX, TROPONINI in the last 72 hours.  BNP: BNP (last 3 results) Recent Labs    02/06/21 2018  BNP 1,571.8*    ProBNP (last 3 results) No results for input(s): PROBNP in the last 8760 hours.   D-Dimer No results for input(s): DDIMER in the last 72 hours.  Hemoglobin A1C No results for input(s): HGBA1C in the last 72 hours.  Fasting Lipid Panel No results for input(s): CHOL, HDL, LDLCALC, TRIG, CHOLHDL, LDLDIRECT in the last 72 hours. Thyroid Function Tests No results for input(s): TSH, T4TOTAL, T3FREE, THYROIDAB in the last 72 hours.  Invalid input(s): FREET3   Other results:   Imaging    No results found.   Medications:     Scheduled Medications:  apixaban  10 mg Oral BID   Followed by   Melene Muller ON 02/23/2021] apixaban  5 mg Oral BID   Chlorhexidine Gluconate Cloth  6 each Topical Daily   dapagliflozin propanediol  10 mg Oral Daily   digoxin  0.125 mg Oral Daily   insulin aspart  0-5 Units Subcutaneous QHS   insulin aspart  0-9 Units Subcutaneous TID WC   levothyroxine  75 mcg Oral Q0600   losartan  25 mg Oral Daily   mouth rinse  15 mL Mouth Rinse BID   sodium chloride flush  10-40 mL Intracatheter Q12H   sodium chloride flush  3 mL Intravenous Q12H   spironolactone  25 mg Oral Daily   torsemide  40 mg Oral BID    Infusions:  sodium chloride 250 mL (02/15/21 0539)   sodium chloride 10 mL/hr at 02/13/21 1500   sodium chloride Stopped  (02/14/21 1329)   ceFEPime (MAXIPIME) IV 2 g (02/17/21 0537)    PRN Medications: sodium chloride, sodium chloride, acetaminophen, docusate sodium, lip balm, sodium chloride flush    Assessment/Plan   Acute systolic HF -> Cardiogenic shock/acute biventricular heart failure/new cardiomyopathy -New diagnosis of CHF this admission after presenting with hemoptysis at OSH in [place]. Suspected viral illness with > 1 month hx weakness, fatigue, cough, N/V. HIV negative. Recently diagnosed with hypothyroidism.  Etiology => viral myocarditis versus familial cardiomyopathy (mother and aunts/uncles with CHF).  Coronary angiography was normal this admission.   - Echo here with EF of 10-15%, mildly dilated LV, moderate to severely reduced RV fxn with  mildly dilated RV, RVSP 45 mmHg, severe secondary MR, mod-severe TR. - cMRI with LV EF 18%, RV EF 26%, LV thrombus, Subtle non-coronary mid-wall LGE pattern in the inferior and anterolateral walls, possible prior myocarditis. - BNP 1,571. Pregnancy test negative.  - RHC with CI 2.48 on milrinone 0.25.  Now off milrinone and NE with co-ox 64%.  - CVP 8, continue torsemide 40 mg bid.  - Now off midodrine.  - Continue losartan 25 mg daily, no BP room to transition to [employer].  - Continue dapagliflozin 10 daily.  - Continue spironolactone 25 mg daily.   - Continue digoxin, level 0.4 recently.  - Off ivabradine with PMVT.  - No beta blocker due to shock. - Overall picture concerning, she is someone we may need to consider advanced therapies for.   2. Mitral valve regurgitation - Severe on echo this admission, only looked mild on cMRI however.  - Likely secondary, valve does not look abnormal on echo.   3. Moderate-severe TR - Likely secondary  4. NSVT => episodes of PMVT - She had been on amiodarone gtt for NSVT, prolonged QTc and developed PMVT.  Now off amiodarone, Corlanor, and milrinone.  Transiently on lidocaine.  No further PMVT.   - Stop  milrinone today.  - Keep K > 4, Mg > 2.   5. Hemoptysis/pneumonia - Possible PE noted on imaging at OSH. Suspect course of events likely viral illness/myocarditis > inactivity, followed by PE and pneumonia. Venous duplex negative for DVT. - CT chest here with consolidation both lower lobes. Ground glass opacities right middle lobe. - Was on ceftriaxone, now stopped by CCM.  - Suspect hemoptysis was due to elevated left atrial pressure.  - WBC 17.0. > 19.6> 19 > 17.9 > 17.3 > 17.6 > 17.5 > 17 > 19 => persistent elevation.  - With ongoing leukocytosis and dense right base consolidation on cMRI as well as green sputum and PCT 0.31 today, covering for HCAP with cefepime. MRSA nasal swab was negative, but GPCs noted on sputum culture => will add vancomycin.   6. RLL PE - Seen on imaging at [facility] (CTA chest).  - On apixaban now.   6. Hypothyroidism -Recently diagnosed. -TSH 13.5/Free T4 1.39. -On synthroid PTA.   7. Elevated INR - Possibly due to passive congestion from CHF. - Given Vit K 6/29, 6/30, and 7/1 per CCM.  8. Hyponatremia - Resolved.  - Restrict free water  9. Acute blood loss anemia -Hgb 12.6 > 13.5 > 9.8> 9 > 10 > 9.4 > 9.6 > 9.9 > 9.5 > 9 > 9.3.   -Presented with massive hemoptysis which is now improved, suspect due primarily to elevated LA pressure.  10. 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  Patient is currently on apixaban, plts up to 154 today.   11. LV thrombus - On apixaban given HIT.   Ambulate today, think she is stable for step-down.   CRITICAL CARE Performed by: Marca Ancona  Total critical care time: 35 minutes  Critical care time was exclusive of separately billable procedures and treating other patients.  Critical care was necessary to treat or prevent imminent or life-threatening deterioration.  Critical care was time spent personally by me (independent of midlevel providers or residents) on the following activities: development of treatment plan with  patient and/or surrogate as well as nursing, discussions with consultants, evaluation of patient's response to treatment, examination of patient, obtaining history from patient or surrogate, ordering and performing treatments and interventions, ordering and review  of laboratory studies, ordering and review of radiographic studies, pulse oximetry and re-evaluation of patient's condition.  Marca Ancona, MD  9:04 AM 02/17/2021

## 2021-02-17 NOTE — Progress Notes (Signed)
PROGRESS NOTE    Debra Daniel  MRN:[id number] DOB: March 10, 1986 DOA: 02/06/2021 PCP: Patient, No Pcp Per (Inactive)    Brief Narrative:  Debra Daniel is a 35 year old female with past medical history significant for migraine headache, obesity recent diagnosis of hypothyroidism and recent nausea/vomiting with UTI symptoms treated with antibiotics outpatient who initially presented to [facility] on 6/27 with progressive lower extremity edema, hemoptysis.  Evaluation in outside facility ED with CTA chest and CT abdomen/pelvis notable for right-sided segmental pulmonary embolism.  She was initially admitted at [facility] started on heparin drip for pulmonary embolism which caused her hemoptysis become progressively worse; heparin was subsequently stopped.  TTE performed with LVEF 25-30% as well as moderate/severe mitral regurgitation and she was transferred to [facility] on 6/28 under PCCM service for further evaluation and care due to hypotension and hemoptysis.   Assessment & Plan:   Active Problems:   Hemoptysis   Acute systolic CHF (congestive heart failure) (HCC)   Pulmonary embolism (HCC)   Hypothyroidism   Tachycardia   Cardiogenic shock (HCC)   Acute systolic congestive heart failure with cardiogenic shock Nonischemic cardiomyopathy with biventricular heart failure Patient presenting as a transfer from [facility] after being found with reduced LVEF of 25-30% with progressive lower extremity edema, TTE on 6/28 2022 with LVEF 10-15%, mildly dilated LV, moderate to severely reduced RV function with mildly dilated RV with RVSP 45 mmHg severe MR and moderate/severe TR.  Suspected etiology from her underlying hypothyroidism versus viral myocarditis versus familial cardiomyopathy.  Initially requiring vasopressors with norepinephrine and milrinone drip and Lasix drip which have been discontinued. --Cardiology/heart failure service following, appreciate  assistance --Net negative 1.1L (4 unmeasured occurences) past 24h and negative 9.1L since admission --Digoxin 0.125 mg p.o. daily --Torsemide 40 mg p.o. twice daily --Losartan 25 mg p.o. daily --Spironolactone 25 mg p.o. daily --Dapagliflozin 10mg  PO daily --Strict I's and O's and daily weights  NSVT/torsades de points V. fib arrest Multifocal atrial tachycardia Patient's developed intraventricular fibrillation arrest requiring CPR for 4 minutes with defibrillation x2 on 02/13/2021 with ROSC achieved. 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 --Hemoglobin stable, 9.3 this morning --Heparin discontinued, bilirubin drip transition to Eliquis on 7/8 --CBC daily  Community-acquired pneumonia CT chest 6/29 with areas of consolidation right lower lobe, left lower lobe and right middle lobe compatible with multifocal pneumonia.   --WBC 14.9>>19.6>>10.7>>17.2>19.6; procalcitonin 0.31 today --Sputum culture 7/7 with  abundant WBCs and rare gram-positive cocci --Starting vancomycin 7/9 --Continue cefepime --Await further identification and susceptibilities from culture --Monitor CBC daily  DVT prophylaxis: Place TED hose Start: 02/09/21 1516 SCDs Start: 02/07/21 0821 apixaban (ELIQUIS) tablet 10 mg  apixaban (ELIQUIS) tablet 5 mg    Code Status: Full Code Family Communication: No family present at bedside this morning, attempted to update patient's mother, Gigi Gin via telephone this morning unsuccessful; voicemail left.  Disposition Plan:  Level of care: Telemetry Cardiac Status is: Inpatient  Remains inpatient appropriate because:Ongoing diagnostic testing needed not appropriate for outpatient work up, Unsafe d/c plan, IV treatments appropriate due to intensity of illness or inability to take PO, and Inpatient level of care appropriate due to severity of illness  Dispo: The patient is from: Home              Anticipated d/c is to: Home              Patient currently is not medically stable to d/c.   Difficult to place patient No   Consultants:  PCCM -signed off 7/9 Cardiology/heart failure service Electrophysiology - signed off 7/8  Procedures:  TTE 6/28: LVEF 10-15%, LV global hypokinesis, LV moderately dilated, RV systolic function severely reduced, LA mildly dilated, severe MR, severe TR, IVC normal in size Vascular duplex ultrasound bilateral lower extremities 6/29: Negative for DVT Right/left heart catheterization 7/5; no significant CAD, mildly elevated filling pressures, pulmonary venous hypertension.  Antimicrobials:  Vancomycin 6/28 - 6/29, 7/9>> Cefepime 6/28 - 6/29, 7/8>> Ceftriaxone 6/29 - 7/3 Doxycycline 6/29 - 7/1   Subjective: Patient seen examined bedside, resting comfortably.  Sitting in bedside chair.  No complaints this morning.  Awaiting for transfer out of ICU today.  Denies headache, no dizziness, no chest pain, palpitations, no shortness of breath, no abdominal  pain.  No acute events overnight per nursing staff.  Objective: Vitals:   02/17/21 0400 02/17/21 0500 02/17/21 0600 02/17/21 0800  BP: 98/67 (!) 93/53 105/65   Pulse: (!) 109 (!) 111 (!) 101 (!) 115  Resp: (!) 23 (!) 33 (!) 32 (!) 24  Temp: 98.7 F (37.1 C)     TempSrc: Oral   Oral  SpO2: 100% 100% 96% 97%  Weight:  95.1 kg    Height:        Intake/Output Summary (Last 24 hours) at 02/17/2021 0943 Last data filed at 02/17/2021 0600 Gross per 24 hour  Intake 553.28 ml  Output 1700 ml  Net -1146.72 ml   Filed Weights   02/15/21 0500 02/16/21 0500 02/17/21 0500  Weight: 98.1 kg 98.6 kg 95.1 kg    Examination:  General exam: Appears calm and comfortable, obese Respiratory system: Clear to auscultation. Respiratory effort normal.  On room air Cardiovascular system: S1 & S2 heard, RRR. No JVD, murmurs, rubs, gallops or clicks. 1-2+ pitting lower extremity edema to mid-shin Gastrointestinal system: Abdomen is nondistended, soft and nontender. No organomegaly or masses felt. Normal bowel sounds heard. Central nervous system: Alert and oriented. No focal neurological deficits. Extremities: Symmetric 5 x 5 power. Skin: No rashes, lesions or ulcers Psychiatry: Judgement and insight appear normal. Mood & affect appropriate.     Data Reviewed: I have personally reviewed following labs and imaging studies  CBC: Recent Labs  Lab 02/14/21 0805 02/14/21 1002 02/14/21 1332 02/15/21 0427 02/16/21 0422 02/17/21 0409  WBC 10.7* 17.2*  --  17.5* 17.2* 19.6*  HGB 6.7* 9.9*  --  9.5* 9.0* 9.3*  HCT 21.5* 31.1*  --  30.2* 29.0* 29.4*  MCV 84.3 81.6  --  81.8 82.2 81.4  PLT 64* 90* 93* 121* 113* 154   Basic Metabolic Panel: Recent Labs  Lab 02/14/21 1113 02/15/21 0427 02/15/21 1240 02/16/21  0422 02/17/21 0409  NA 132* 135 133* 132* 134*  K 4.3 4.4 3.8 4.0 3.7  CL 94* 98 97* 97* 97*  CO2 28 30 28 29 28   GLUCOSE 159* 110* 146* 133* 99  BUN 16 17 15 17 17   CREATININE 0.98 1.01*  0.92 0.88 0.88  CALCIUM 8.2* 8.2* 7.9* 7.8* 7.9*  MG 2.2 1.9 2.0 1.7 2.0   GFR: Estimated Creatinine Clearance: 102.8 mL/min (by C-G formula based on SCr of 0.88 mg/dL). Liver Function Tests: Recent Labs  Lab 02/13/21 2139 02/17/21 0409  AST 58* 44*  ALT 41 39  ALKPHOS 64 60  BILITOT 2.0* 1.9*  PROT 5.4* 4.8*  ALBUMIN 1.9* 1.8*   No results for input(s): LIPASE, AMYLASE in the last 168 hours. No results for input(s): AMMONIA in the last 168 hours. Coagulation Profile: No results for input(s): INR, PROTIME in the last 168 hours. Cardiac Enzymes: No results for input(s): CKTOTAL, CKMB, CKMBINDEX, TROPONINI in the last 168 hours. BNP (last 3 results) No results for input(s): PROBNP in the last 8760 hours. HbA1C: No results for input(s): HGBA1C in the last 72 hours. CBG: Recent Labs  Lab 02/16/21 0544 02/16/21 1121 02/16/21 1558 02/16/21 2122 02/17/21 0650  GLUCAP 97 115* 95 90 95   Lipid Profile: No results for input(s): CHOL, HDL, LDLCALC, TRIG, CHOLHDL, LDLDIRECT in the last 72 hours. Thyroid Function Tests: No results for input(s): TSH, T4TOTAL, FREET4, T3FREE, THYROIDAB in the last 72 hours. Anemia Panel: No results for input(s): VITAMINB12, FOLATE, FERRITIN, TIBC, IRON, RETICCTPCT in the last 72 hours. Sepsis Labs: Recent Labs  Lab 02/11/21 0800 02/11/21 1055 02/13/21 2143 02/14/21 0520 02/16/21 1235 02/17/21 0409  PROCALCITON  --   --   --   --  0.31 0.31  LATICACIDVEN 2.1* 2.3* 2.7* 1.0  --   --     Recent Results (from the past 240 hour(s))  SARS Coronavirus 2 by RT PCR (hospital order, performed in [facility] hospital lab) Nasopharyngeal Nasopharyngeal Swab     Status: None   Collection Time: 02/13/21  3:23 AM   Specimen: Nasopharyngeal Swab  Result Value Ref Range Status   SARS Coronavirus 2 NEGATIVE NEGATIVE Final    Comment: (NOTE) SARS-CoV-2 target nucleic acids are NOT DETECTED.  The SARS-CoV-2 RNA is generally detectable in upper and  lower respiratory specimens during the acute phase of infection. The lowest concentration of SARS-CoV-2 viral copies this assay can detect is 250 copies / mL. A negative result does not preclude SARS-CoV-2 infection and should not be used as the sole basis for treatment or other patient management decisions.  A negative result may occur with improper specimen collection / handling, submission of specimen other than nasopharyngeal swab, presence of viral mutation(s) within the areas targeted by this assay, and inadequate number of viral copies (<250 copies / mL). A negative result must be combined with clinical observations, patient history, and epidemiological information.  Fact Sheet for Patients:   [facility]  Fact Sheet for Healthcare Providers: 04/16/21  This test is not yet approved or  cleared by the [employer] FDA and has been authorized for detection and/or diagnosis of SARS-CoV-2 by FDA under an Emergency Use Authorization (EUA).  This EUA will remain in effect (meaning this test can be used) for the duration of the COVID-19 declaration under Section 564(b)(1) of the Act, 21 U.S.C. section 360bbb-3(b)(1), unless the authorization is terminated or revoked sooner.  Performed at [facility], [address],  [place], [place] [address]   Expectorated Sputum Assessment w Gram Stain, Rflx to Resp Cult     Status: None   Collection Time: 02/15/21 11:49 AM   Specimen: Expectorated Sputum  Result Value Ref Range Status   Specimen Description EXPECTORATED SPUTUM  Final   Special Requests NONE  Final   Sputum evaluation   Final    THIS SPECIMEN IS ACCEPTABLE FOR SPUTUM CULTURE Performed at [facility], [address], [place], [place] [address]    Report Status 02/16/2021 FINAL  Final  Culture, Respiratory w Gram Stain     Status: None (Preliminary result)   Collection Time: 02/15/21 11:49 AM   Result Value Ref Range Status   Specimen Description EXPECTORATED SPUTUM  Final   Special Requests NONE Reflexed from [id number]  Final   Gram Stain   Final    ABUNDANT WBC PRESENT,BOTH PMN AND MONONUCLEAR RARE GRAM POSITIVE COCCI Performed at [facility], [address], [place], [place] [address]    Culture PENDING  Incomplete   Report Status PENDING  Incomplete         Radiology Studies: DG Chest Port 1 View  Result Date: 02/16/2021 CLINICAL DATA:  Coughing up blood EXAM: PORTABLE CHEST 1 VIEW COMPARISON:  02/13/2021 FINDINGS: Right-sided PICC line with the tip projecting over the cavoatrial junction. Bilateral lower lung patchy airspace disease likely reflecting persistent pneumonia. No pleural effusion or pneumothorax. Heart and mediastinal contours are unremarkable. No acute osseous abnormality. IMPRESSION: Bilateral lower lung patchy airspace disease likely reflecting persistent pneumonia. Electronically Signed   By: Elige Ko   On: 02/16/2021 09:02   MR CARDIAC MORPHOLOGY W WO CONTRAST  Result Date: 02/15/2021 CLINICAL DATA:  Cardiomyopathy of uncertain etiology. EXAM: CARDIAC MRI TECHNIQUE: The patient was scanned on a 1.5 Tesla GE magnet. A dedicated cardiac coil was used. Functional imaging was done using Fiesta sequences. 2,3, and 4 chamber views were done to assess for RWMA's. Modified Simpson's rule using a short axis stack was used to calculate an ejection fraction on a dedicated work [employer]. The patient received 8 cc of Gadavist. After 10 minutes inversion recovery sequences were used to assess for infiltration and scar tissue. FINDINGS: Right lung base consolidation noted.  Small right pleural effusion. Moderately dilated left ventricle with normal wall thickness, diffuse hypokinesis with EF 18%. There is a left ventricular apical thrombus, 1.5 cm. Moderately dilated right ventricle with RV EF 26%. Mild biatrial enlargement. Trileaflet aortic valve  with no significant stenosis or regurgitation. Mild mitral regurgitation, regurgitant fraction 15%. Delayed enhancement images are difficult. Possible subtle mid-wall late gadolinium enhancement (LGE) in the mid inferior wall and the mid anterolateral wall. Measurements: LVEDV 302 mL LVSV 53 mL LVEF 18% RVEDV 208 mL RVSV 54 mL RVEF 26% Aortic forward volume 45 mL Mitral regurgitant fraction 15% IMPRESSION: 1.  Moderately dilated LV with EF 18%, diffuse hypokinesis. 2.  LV thrombus noted. 3.  Moderately dilated RV with EF 26%. 4.  MR looks mild by cMRI, regurgitant fraction 15%. 5. Subtle non-coronary mid-wall LGE pattern in the inferior and anterolateral walls, possible prior myocarditis. 6.  Consolidation of the right lung base. Dalton Mclean Electronically Signed   By: Marca Ancona M.D.   On: 02/15/2021 18:15        Scheduled Meds:  apixaban  10 mg Oral BID   Followed by   Melene Muller ON 02/23/2021] apixaban  5 mg Oral BID   Chlorhexidine Gluconate Cloth  6 each Topical Daily   dapagliflozin propanediol  10 mg Oral Daily   digoxin  0.125 mg Oral Daily   insulin aspart  0-5 Units Subcutaneous QHS   insulin aspart  0-9 Units Subcutaneous TID WC   levothyroxine  75 mcg Oral Q0600   losartan  25 mg Oral Daily   mouth rinse  15 mL Mouth Rinse BID   sodium chloride flush  10-40 mL Intracatheter Q12H   sodium chloride flush  3 mL Intravenous Q12H   spironolactone  25 mg Oral Daily   torsemide  40 mg Oral BID   Continuous Infusions:  sodium chloride 250 mL (02/15/21 0539)   sodium chloride 10 mL/hr at 02/13/21 1500   sodium chloride Stopped (02/14/21 1329)   ceFEPime (MAXIPIME) IV 2 g (02/17/21 0537)     LOS: 11 days    Time spent: 46 minutes spent on chart review, discussion with nursing staff, consultants, updating family and interview/physical exam; more than 50% of that time was spent in counseling and/or coordination of care.    Alvira Philips [place], DO [facility] Available via  Epic secure chat 7am-7pm After these hours, please refer to coverage provider listed on [email or web address] 02/17/2021, 9:43 AM

## 2021-02-17 NOTE — Progress Notes (Signed)
[facility] ADULT ICU REPLACEMENT PROTOCOL   The patient does apply for the [facility] Adult ICU Electrolyte Replacment Protocol based on the criteria listed below:   1. Is GFR >/= 30 ml/min? Yes.    Patient's GFR today is >60  2. Is SCr </= 2? Yes.   Patient's SCr is 0.88 ml/kg/hr 3. Did SCr increase >/= 0.5 in 24 hours? No. 4. Abnormal electrolyte(s):   K 3.7 5. Ordered repletion with: protocol 6. If a panic level lab has been reported, has the CCM MD in charge been notified?  Physician:  Alfonso Ramus R Dashayla Theissen 02/17/2021 5:39 AM

## 2021-02-17 NOTE — Progress Notes (Addendum)
Pharmacy Antibiotic Note  Debra Daniel is a 35 y.o. female admitted on 02/06/2021 with R sided PE causing hemoptysis now with possible hospital acquired pneumonia.  Pharmacy has been consulted for vancomycin and cefepime dosing.  7/9 Tmax 99 WBC 17.2 > 19.6  Plan: Vancomycin 2g IV load x1 then Vancomycin 1750 mg IV every 24 hours.  Goal trough 10-15 mcg/mL. (Goal AUC 400-550). Expected AUC: 519 Scr 0.88 (7/9) Vd coeff: 0.5 (obesity) IBW used to calculate CrCl and Ke TBW 95.1 kg  Cefepime 2 mg q8h (for CrCl > 60)  Height: 5' 4.5" (163.8 cm) Weight: 95.1 kg (209 lb 10.5 oz) IBW/kg (Calculated) : 55.85  Temp (24hrs), Avg:98.7 F (37.1 C), Min:98.1 F (36.7 C), Max:99.2 F (37.3 C)  Recent Labs  Lab 02/11/21 0800 02/11/21 1055 02/11/21 1400 02/13/21 2143 02/13/21 2206 02/14/21 0520 02/14/21 0805 02/14/21 1002 02/14/21 1113 02/15/21 0427 02/15/21 1240 02/16/21 0422 02/17/21 0409  WBC  --   --    < >  --    < >  --  10.7* 17.2*  --  17.5*  --  17.2* 19.6*  CREATININE  --   --    < >  --   --  0.89  --   --  0.98 1.01* 0.92 0.88 0.88  LATICACIDVEN 2.1* 2.3*  --  2.7*  --  1.0  --   --   --   --   --   --   --    < > = values in this interval not displayed.    Estimated Creatinine Clearance: 102.8 mL/min (by C-G formula based on SCr of 0.88 mg/dL).    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  Consuela Mimes, PharmD 02/17/2021 9:38 AM

## 2021-02-18 ENCOUNTER — Encounter: Payer: Self-pay | Admitting: Cardiology

## 2021-02-18 LAB — HEPATIC FUNCTION PANEL
ALT: 45 U/L — ABNORMAL HIGH (ref 0–44)
AST: 56 U/L — ABNORMAL HIGH (ref 15–41)
Albumin: 1.9 g/dL — ABNORMAL LOW (ref 3.5–5.0)
Alkaline Phosphatase: 66 U/L (ref 38–126)
Bilirubin, Direct: 0.9 mg/dL — ABNORMAL HIGH (ref 0.0–0.2)
Indirect Bilirubin: 1.1 mg/dL — ABNORMAL HIGH (ref 0.3–0.9)
Total Bilirubin: 2 mg/dL — ABNORMAL HIGH (ref 0.3–1.2)
Total Protein: 5.3 g/dL — ABNORMAL LOW (ref 6.5–8.1)

## 2021-02-18 LAB — TYPE AND SCREEN
ABO/RH(D): A POS
Antibody Screen: NEGATIVE
Unit division: 0
Unit division: 0

## 2021-02-18 LAB — CBC
HCT: 29 % — ABNORMAL LOW (ref 36.0–46.0)
Hemoglobin: 9 g/dL — ABNORMAL LOW (ref 12.0–15.0)
MCH: 25.4 pg — ABNORMAL LOW (ref 26.0–34.0)
MCHC: 31 g/dL (ref 30.0–36.0)
MCV: 81.9 fL (ref 80.0–100.0)
Platelets: 197 10*3/uL (ref 150–400)
RBC: 3.54 MIL/uL — ABNORMAL LOW (ref 3.87–5.11)
RDW: 18.7 % — ABNORMAL HIGH (ref 11.5–15.5)
WBC: 20.8 10*3/uL — ABNORMAL HIGH (ref 4.0–10.5)
nRBC: 0 % (ref 0.0–0.2)

## 2021-02-18 LAB — BPAM RBC
Blood Product Expiration Date: 202207262359
Blood Product Expiration Date: 202207262359
Unit Type and Rh: 6200
Unit Type and Rh: 6200

## 2021-02-18 LAB — GLUCOSE, CAPILLARY
Glucose-Capillary: 119 mg/dL — ABNORMAL HIGH (ref 70–99)
Glucose-Capillary: 101 mg/dL — ABNORMAL HIGH (ref 70–99)
Glucose-Capillary: 113 mg/dL — ABNORMAL HIGH (ref 70–99)

## 2021-02-18 LAB — BASIC METABOLIC PANEL
Anion gap: 10 (ref 5–15)
BUN: 19 mg/dL (ref 6–20)
CO2: 28 mmol/L (ref 22–32)
Calcium: 8.2 mg/dL — ABNORMAL LOW (ref 8.9–10.3)
Chloride: 99 mmol/L (ref 98–111)
Creatinine, Ser: 0.9 mg/dL (ref 0.44–1.00)
GFR, Estimated: >60 mL/min (ref >60)
Glucose, Bld: 97 mg/dL (ref 70–99)
Potassium: 4.2 mmol/L (ref 3.5–5.1)
Sodium: 137 mmol/L (ref 135–145)

## 2021-02-18 LAB — PROTIME-INR
INR: 2.4 — ABNORMAL HIGH (ref 0.8–1.2)
Prothrombin Time: 26.2 seconds — ABNORMAL HIGH (ref 11.4–15.2)

## 2021-02-18 LAB — COOXEMETRY PANEL
Carboxyhemoglobin: 1.6 % — ABNORMAL HIGH (ref 0.5–1.5)
Methemoglobin: 0.8 % (ref 0.0–1.5)
O2 Saturation: 65.7 %
Total hemoglobin: 9.3 g/dL — ABNORMAL LOW (ref 12.0–16.0)

## 2021-02-18 LAB — MAGNESIUM: Magnesium: 1.9 mg/dL (ref 1.7–2.4)

## 2021-02-18 LAB — CULTURE, RESPIRATORY W GRAM STAIN: Culture: NORMAL

## 2021-02-18 LAB — PROCALCITONIN: Procalcitonin: 0.34 ng/mL

## 2021-02-18 MED ORDER — ALTEPLASE 2 MG IJ SOLR
2.0000 mg | Freq: Once | INTRAMUSCULAR | Status: AC
  Administered 2021-02-18: 2 mg
  Filled 2021-02-18: qty 2

## 2021-02-18 MED ORDER — MAGNESIUM SULFATE 2 GM/50ML IV SOLN
2.0000 g | Freq: Once | INTRAVENOUS | Status: AC
  Administered 2021-02-18: 2 g via INTRAVENOUS
  Filled 2021-02-18: qty 50

## 2021-02-18 NOTE — Progress Notes (Signed)
PROGRESS NOTE    Debra Daniel  JOA:[id number] DOB: 1985-12-22 DOA: 02/06/2021 PCP: Patient, No Pcp Per (Inactive)    Brief Narrative:  Debra Daniel is a 35 year old female with past medical history significant for migraine headache, obesity recent diagnosis of hypothyroidism and recent nausea/vomiting with UTI symptoms treated with antibiotics outpatient who initially presented to [facility] on 6/27 with progressive lower extremity edema, hemoptysis.  Evaluation in outside facility ED with CTA chest and CT abdomen/pelvis notable for right-sided segmental pulmonary embolism.  She was initially admitted at [facility] started on heparin drip for pulmonary embolism which caused her hemoptysis become progressively worse; heparin was subsequently stopped.  TTE performed with LVEF 25-30% as well as moderate/severe mitral regurgitation and she was transferred to [facility] on 6/28 under PCCM service for further evaluation and care due to hypotension and hemoptysis.   Assessment & Plan:   Active Problems:   Hemoptysis   Acute systolic CHF (congestive heart failure) (HCC)   Pulmonary embolism (HCC)   Hypothyroidism   Tachycardia   Cardiogenic shock (HCC)   Acute systolic congestive heart failure with cardiogenic shock Nonischemic cardiomyopathy with biventricular heart failure Patient presenting as a transfer from [facility] after being found with reduced LVEF of 25-30% with progressive lower extremity edema, TTE on 6/28 2022 with LVEF 10-15%, mildly dilated LV, moderate to severely reduced RV function with mildly dilated RV with RVSP 45 mmHg severe MR and moderate/severe TR.  Suspected etiology from her underlying hypothyroidism versus viral myocarditis versus familial cardiomyopathy.  Initially requiring vasopressors with norepinephrine and milrinone drip and Lasix drip which have been discontinued. --Cardiology/heart failure service following, appreciate  assistance --Net negative (2 unmeasured occurences) past 24h and negative 10.7L since admission --Digoxin 0.125 mg p.o. daily --Torsemide 40 mg p.o. twice daily --Losartan 25 mg p.o. daily --Spironolactone 25 mg p.o. daily --Dapagliflozin 10mg  PO daily --Strict I's and O's and daily weights  NSVT/torsades de points V. fib arrest Multifocal atrial tachycardia Patient's developed intraventricular fibrillation arrest requiring CPR for 4 minutes with defibrillation x2 on 02/13/2021 with ROSC achieved. 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  Was placed on bivalirudin drip for HIT (positive heparin-induced platelet antibodies, 2.313 on 7/6).  Lower extremity Doppler ultrasounds negative for DVT on 6/29 2022. --Bilirubin drip transition to Eliquis 10 mg p.o. BID x 7d followed by 5mg  PO BID, hemoglobin stable, platelet level rising; 197 today --Serotonin release assay: Pending --CBC, INR daily  Hypothyroidism --Levothyroxine 2023 PO daily  Elevated LFTs Elevated INR Etiology likely secondary to hepatic congestion from biventricular congestive heart failure. 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 --Hemoglobin stable, 9.3 this morning --Heparin discontinued, bilirubin drip transition to Eliquis on 7/8 --CBC daily  Community-acquired pneumonia CT chest 6/29 with areas of consolidation right lower lobe, left lower lobe and right middle lobe compatible with multifocal pneumonia.   --WBC 14.9>>19.6>>10.7>>17.2>19.6>20.8; procalcitonin 0.31>0.34  today --Sputum culture 7/7 with normal flora, no staph/pseudomonas noted --vancomycin, cefepime --Monitor CBC daily  DVT prophylaxis: Place TED hose Start: 02/09/21 1516 SCDs Start: 02/07/21 0821 apixaban (ELIQUIS) tablet 10 mg  apixaban (ELIQUIS) tablet 5 mg  Code Status: Full Code Family Communication: No family present at bedside this morning  Disposition Plan:  Level of care: Telemetry Cardiac Status is: Inpatient  Remains inpatient appropriate because:Ongoing diagnostic testing needed not appropriate for outpatient work up, Unsafe d/c plan, IV treatments appropriate due to intensity of illness or inability to take PO, and Inpatient level of care appropriate due to severity of illness  Dispo: The patient is from: Home              Anticipated d/c is to: Home              Patient currently is not medically stable to d/c.   Difficult to place patient No   Consultants:  PCCM -signed off 7/9 Cardiology/heart failure service Electrophysiology - signed off 7/8  Procedures:  TTE 6/28: LVEF 10-15%, LV global hypokinesis, LV moderately dilated, RV systolic function severely reduced, LA mildly dilated, severe MR, severe TR, IVC normal in size Vascular duplex ultrasound bilateral lower extremities 6/29: Negative for DVT Right/left heart catheterization 7/5; no significant CAD, mildly elevated filling pressures, pulmonary venous hypertension.  Antimicrobials:  Vancomycin 6/28 - 6/29, 7/9>> Cefepime 6/28 - 6/29, 7/8>> Ceftriaxone 6/29 - 7/3 Doxycycline 6/29 - 7/1   Subjective: Patient seen examined bedside, resting comfortably.  Sitting in bedside chair.  No complaints this morning.  Transferring out of the ICU today.  Denies headache, no dizziness, no chest pain, palpitations, no shortness of breath, no abdominal pain.  No acute events overnight per nursing staff.  Objective: Vitals:   02/18/21 0621 02/18/21 0744 02/18/21 0800 02/18/21 0907  BP: (!) 89/59  95/75 (!) 103/59   Pulse: (!) 106  (!) 116 (!) 112  Resp: 20  (!) 27 (!) 35  Temp:  98.1 F (36.7 C)  98.1 F (36.7 C)  TempSrc:  Oral  Oral  SpO2: 98%  100%   Weight:      Height:        Intake/Output Summary (Last 24 hours) at 02/18/2021 0936 Last data filed at 02/18/2021 0200 Gross per 24 hour  Intake 289.92 ml  Output 1900 ml  Net -1610.08 ml   Filed Weights   02/16/21 0500 02/17/21 0500 02/18/21 0500  Weight: 98.6 kg 95.1 kg 93.3 kg    Examination:  General exam: Appears calm and comfortable, obese Respiratory system: Clear to auscultation. Respiratory effort normal.  On room air Cardiovascular system: S1 & S2 heard, RRR. No JVD, murmurs, rubs, gallops or clicks. 1-2+ pitting lower extremity edema to mid-shin Gastrointestinal system: Abdomen is nondistended, soft and nontender. No organomegaly or masses felt. Normal bowel sounds heard. Central nervous system: Alert and oriented. No focal neurological deficits. Extremities: Symmetric 5 x 5 power. Skin: No rashes, lesions or ulcers Psychiatry: Judgement and insight appear normal. Mood & affect appropriate.     Data Reviewed: I have personally reviewed following labs and imaging studies  CBC: Recent Labs  Lab 02/14/21 1002 02/14/21 1332 02/15/21 0427 02/16/21 0422 02/17/21 0409 02/18/21 0511  WBC 17.2*  --  17.5* 17.2* 19.6* 20.8*  HGB 9.9*  --  9.5* 9.0* 9.3* 9.0*  HCT 31.1*  --  30.2* 29.0* 29.4* 29.0*  MCV 81.6  --  81.8 82.2 81.4 81.9  PLT 90* 93* 121* 113* 154 197   Basic Metabolic Panel: Recent Labs  Lab 02/15/21 0427 02/15/21 1240 02/16/21 0422 02/17/21 0409 02/17/21 1100 02/17/21 2000 02/18/21 0511  NA 135 133* 132* 134*  --   --  137  K  4.4 3.8 4.0 3.7 3.7 4.1 4.2  CL 98 97* 97* 97*  --   --  99  CO2 30 28 29 28   --   --  28  GLUCOSE 110* 146* 133* 99  --   --  97  BUN 17 15 17 17   --   --  19  CREATININE 1.01* 0.92 0.88 0.88  --   --  0.90  CALCIUM 8.2* 7.9* 7.8* 7.9*  --   --  8.2*  MG 1.9 2.0 1.7 2.0   --   --  1.9   GFR: Estimated Creatinine Clearance: 99.5 mL/min (by C-G formula based on SCr of 0.9 mg/dL). Liver Function Tests: Recent Labs  Lab 02/13/21 2139 02/17/21 0409 02/18/21 0511  AST 58* 44* 56*  ALT 41 39 45*  ALKPHOS 64 60 66  BILITOT 2.0* 1.9* 2.0*  PROT 5.4* 4.8* 5.3*  ALBUMIN 1.9* 1.8* 1.9*   No results for input(s): LIPASE, AMYLASE in the last 168 hours. No results for input(s): AMMONIA in the last 168 hours. Coagulation Profile: Recent Labs  Lab 02/18/21 0511  INR 2.4*   Cardiac Enzymes: No results for input(s): CKTOTAL, CKMB, CKMBINDEX, TROPONINI in the last 168 hours. BNP (last 3 results) No results for input(s): PROBNP in the last 8760 hours. HbA1C: No results for input(s): HGBA1C in the last 72 hours. CBG: Recent Labs  Lab 02/16/21 2122 02/17/21 0650 02/17/21 1149 02/17/21 1547 02/18/21 0740  GLUCAP 90 95 106* 84 119*   Lipid Profile: No results for input(s): CHOL, HDL, LDLCALC, TRIG, CHOLHDL, LDLDIRECT in the last 72 hours. Thyroid Function Tests: No results for input(s): TSH, T4TOTAL, FREET4, T3FREE, THYROIDAB in the last 72 hours. Anemia Panel: No results for input(s): VITAMINB12, FOLATE, FERRITIN, TIBC, IRON, RETICCTPCT in the last 72 hours. Sepsis Labs: Recent Labs  Lab 02/11/21 1055 02/13/21 2143 02/14/21 0520 02/16/21 1235 02/17/21 0409 02/18/21 0511  PROCALCITON  --   --   --  0.31 0.31 0.34  LATICACIDVEN 2.3* 2.7* 1.0  --   --   --     Recent Results (from the past 240 hour(s))  SARS Coronavirus 2 by RT PCR (hospital order, performed in [facility] hospital lab) Nasopharyngeal Nasopharyngeal Swab     Status: None   Collection Time: 02/13/21  3:23 AM   Specimen: Nasopharyngeal Swab  Result Value Ref Range Status   SARS Coronavirus 2 NEGATIVE NEGATIVE Final    Comment: (NOTE) SARS-CoV-2 target nucleic acids are NOT DETECTED.  The SARS-CoV-2 RNA is generally detectable in upper and lower respiratory specimens during the  acute phase of infection. The lowest concentration of SARS-CoV-2 viral copies this assay can detect is 250 copies / mL. A negative result does not preclude SARS-CoV-2 infection and should not be used as the sole basis for treatment or other patient management decisions.  A negative result may occur with improper specimen collection / handling, submission of specimen other than nasopharyngeal swab, presence of viral mutation(s) within the areas targeted by this assay, and inadequate number of viral copies (<250 copies / mL). A negative result must be combined with clinical observations, patient history, and epidemiological information.  Fact Sheet for Patients:   [facility]  Fact Sheet for Healthcare Providers: 04/16/21  This test is not yet approved or  cleared by the [employer] FDA and has been authorized for detection and/or diagnosis of SARS-CoV-2 by FDA under an Emergency Use Authorization (EUA).  This EUA will remain in effect (meaning this  test can be used) for the duration of the COVID-19 declaration under Section 564(b)(1) of the Act, 21 U.S.C. section 360bbb-3(b)(1), unless the authorization is terminated or revoked sooner.  Performed at [facility], [address] [place], [place] [address]   Expectorated Sputum Assessment w Gram Stain, Rflx to Resp Cult     Status: None   Collection Time: 02/15/21 11:49 AM   Specimen: Expectorated Sputum  Result Value Ref Range Status   Specimen Description EXPECTORATED SPUTUM  Final   Special Requests NONE  Final   Sputum evaluation   Final    THIS SPECIMEN IS ACCEPTABLE FOR SPUTUM CULTURE Performed at [facility], [address], [place], [place] [address]    Report Status 02/16/2021 FINAL  Final  Culture, Respiratory w Gram Stain     Status: None (Preliminary result)   Collection Time: 02/15/21 11:49 AM  Result Value Ref Range Status   Specimen  Description EXPECTORATED SPUTUM  Final   Special Requests NONE Reflexed from [id number]  Final   Gram Stain   Final    ABUNDANT WBC PRESENT,BOTH PMN AND MONONUCLEAR RARE GRAM POSITIVE COCCI    Culture   Final    RARE Normal respiratory flora-no Staph aureus or Pseudomonas seen Performed at [facility], [address], [place], [place] [address]    Report Status PENDING  Incomplete         Radiology Studies: No results found.      Scheduled Meds:  apixaban  10 mg Oral BID   Followed by   Melene Muller ON 02/23/2021] apixaban  5 mg Oral BID   Chlorhexidine Gluconate Cloth  6 each Topical Daily   dapagliflozin propanediol  10 mg Oral Daily   digoxin  0.125 mg Oral Daily   insulin aspart  0-5 Units Subcutaneous QHS   insulin aspart  0-9 Units Subcutaneous TID WC   levothyroxine  75 mcg Oral Q0600   losartan  25 mg Oral Daily   mouth rinse  15 mL Mouth Rinse BID   sodium chloride flush  10-40 mL Intracatheter Q12H   sodium chloride flush  3 mL Intravenous Q12H   spironolactone  25 mg Oral Daily   torsemide  40 mg Oral BID   Continuous Infusions:  sodium chloride 250 mL (02/15/21 0539)   sodium chloride 10 mL/hr at 02/13/21 1500   sodium chloride Stopped (02/14/21 1329)   ceFEPime (MAXIPIME) IV 2 g (02/18/21 0531)   vancomycin       LOS: 12 days    Time spent: 39 minutes spent on chart review, discussion with nursing staff, consultants, updating family and interview/physical exam; more than 50% of that time was spent in counseling and/or coordination of care.    Alvira Philips [place], DO [facility] Available via Epic secure chat 7am-7pm After these hours, please refer to coverage provider listed on [email or web address] 02/18/2021, 9:36 AM

## 2021-02-18 NOTE — Progress Notes (Signed)
Patient ID: Debra Daniel, female   DOB: 11/20/1985, 35 y.o.   MRN: [id number]     Advanced Heart Failure Rounding Note  PCP-Cardiologist: None   Subjective:   -05/13: Seen in ED for recurrent nausea and vomiting. Treated for possible UTI. Ongoing weakness, fatigue, intermittent N/V, cough since. -06/27: ED at OSH with hemoptysis. Found to have segmental PE and pneumonia. EF 25-30%. -06/28: Transferred to [facility]. Cardiogenic shock > milrinone 0.25 mcg. Diuresed with IV lasix. -06/29: Tachycardic. MAT noted on tele. Added digoxin and spiro. Amio added d/t NSVT. Last dose IV lasix. Norepi off. - 6/30: midodrine started and lasix drip started.  - 7/5: RHC/LHC No CAD   - 7/5 Developed PMVT and required defibrillation x 2 with brief CPR.  QTc prolonged, amiodarone was stopped.  She received Mg and K. Milrinone decreased to 0.125.  She had another episode PMVT, Corlanor stopped.Now off lidocaine   Feeling better. Denies SOB. No further VT. Getting fit for LifeVest. Unable to get CVP off of PICC today. Still tachy    Cardiac MRI: 1.  Moderately dilated LV with EF 18%, diffuse hypokinesis. 2.  LV thrombus noted. 3.  Moderately dilated RV with EF 26%. 4.  MR looks mild by cMRI, regurgitant fraction 15%. 5. Subtle non-coronary mid-wall LGE pattern in the inferior and anterolateral walls, possible prior myocarditis. 6.  Consolidation of the right lung base.  Echo 02/06/2021: LVEF 10-15%, LV moderately dilated, RV systolic function severely reduced, RV moderately enlarged, RVSP 45.9 mmHg, severe secondary MR, mod-severe TR (personally reviewed).    Objective:   Weight Range: 93.3 kg Body mass index is 34.76 kg/m.   Vital Signs:   Temp:  [97.8 F (36.6 C)-99.6 F (37.6 C)] 98.1 F (36.7 C) (07/10 0907) Pulse Rate:  [44-116] 107 (07/10 1109) Resp:  [20-39] 26 (07/10 1117) BP: (81-103)/(58-75) 99/69 (07/10 1117) SpO2:  [92 %-100 %] 99 % (07/10 1109) Weight:  [93.3 kg] 93.3 kg (07/10  0500) Last BM Date: 02/18/21  Weight change: Filed Weights   02/16/21 0500 02/17/21 0500 02/18/21 0500  Weight: 98.6 kg 95.1 kg 93.3 kg    Intake/Output:   Intake/Output Summary (Last 24 hours) at 02/18/2021 1547 Last data filed at 02/18/2021 0200 Gross per 24 hour  Intake 289.92 ml  Output 1900 ml  Net -1610.08 ml       Physical Exam   General:  Sitting in chair.  No resp difficulty HEENT: normal Neck: supple. JVP 6-7  Carotids 2+ bilat; no bruits. No lymphadenopathy or thryomegaly appreciated. Cor: PMI nondisplaced. Tachy regualr Lungs: clear Abdomen: soft, nontender, nondistended. No hepatosplenomegaly. No bruits or masses. Good bowel sounds. Extremities: no cyanosis, clubbing, rash, tr-1+ edema + ted Neuro: alert & orientedx3, cranial nerves grossly intact. moves all 4 extremities w/o difficulty. Affect pleasant    Telemetry  ST 110-120 (personally reviewed)  Labs    CBC Recent Labs    02/17/21 0409 02/18/21 0511  WBC 19.6* 20.8*  HGB 9.3* 9.0*  HCT 29.4* 29.0*  MCV 81.4 81.9  PLT 154 197    Basic Metabolic Panel Recent Labs    97/98/92 0409 02/17/21 1100 02/17/21 2000 02/18/21 0511  NA 134*  --   --  137  K 3.7   < > 4.1 4.2  CL 97*  --   --  99  CO2 28  --   --  28  GLUCOSE 99  --   --  97  BUN 17  --   --  19  CREATININE 0.88  --   --  0.90  CALCIUM 7.9*  --   --  8.2*  MG 2.0  --   --  1.9   < > = values in this interval not displayed.    Liver Function Tests Recent Labs    02/17/21 0409 02/18/21 0511  AST 44* 56*  ALT 39 45*  ALKPHOS 60 66  BILITOT 1.9* 2.0*  PROT 4.8* 5.3*  ALBUMIN 1.8* 1.9*     No results for input(s): LIPASE, AMYLASE in the last 72 hours. Cardiac Enzymes No results for input(s): CKTOTAL, CKMB, CKMBINDEX, TROPONINI in the last 72 hours.  BNP: BNP (last 3 results) Recent Labs    02/06/21 2018  BNP 1,571.8*     ProBNP (last 3 results) No results for input(s): PROBNP in the last 8760  hours.   D-Dimer No results for input(s): DDIMER in the last 72 hours.  Hemoglobin A1C No results for input(s): HGBA1C in the last 72 hours.  Fasting Lipid Panel No results for input(s): CHOL, HDL, LDLCALC, TRIG, CHOLHDL, LDLDIRECT in the last 72 hours. Thyroid Function Tests No results for input(s): TSH, T4TOTAL, T3FREE, THYROIDAB in the last 72 hours.  Invalid input(s): FREET3   Other results:   Imaging    No results found.   Medications:     Scheduled Medications:  alteplase  2 mg Intracatheter Once   alteplase  2 mg Intracatheter Once   apixaban  10 mg Oral BID   Followed by   Melene Muller ON 02/23/2021] apixaban  5 mg Oral BID   Chlorhexidine Gluconate Cloth  6 each Topical Daily   dapagliflozin propanediol  10 mg Oral Daily   digoxin  0.125 mg Oral Daily   insulin aspart  0-5 Units Subcutaneous QHS   insulin aspart  0-9 Units Subcutaneous TID WC   levothyroxine  75 mcg Oral Q0600   losartan  25 mg Oral Daily   mouth rinse  15 mL Mouth Rinse BID   sodium chloride flush  10-40 mL Intracatheter Q12H   sodium chloride flush  3 mL Intravenous Q12H   spironolactone  25 mg Oral Daily   torsemide  40 mg Oral BID    Infusions:  sodium chloride 250 mL (02/15/21 0539)   sodium chloride 10 mL/hr at 02/13/21 1500   sodium chloride Stopped (02/14/21 1329)   ceFEPime (MAXIPIME) IV 2 g (02/18/21 1448)   vancomycin      PRN Medications: sodium chloride, sodium chloride, acetaminophen, docusate sodium, lip balm, sodium chloride flush    Assessment/Plan   Acute systolic HF -> Cardiogenic shock/acute biventricular heart failure/new cardiomyopathy -New diagnosis of CHF this admission after presenting with hemoptysis at OSH in [place]. Suspected viral illness with > 1 month hx weakness, fatigue, cough, N/V. HIV negative. Recently diagnosed with hypothyroidism.  Etiology => viral myocarditis versus familial cardiomyopathy (grandmother and aunts/uncles with CHF).  Coronary  angiography was normal this admission.   - Echo here with EF of 10-15%, mildly dilated LV, moderate to severely reduced RV fxn with mildly dilated RV, RVSP 45 mmHg, severe secondary MR, mod-severe TR. - cMRI with LV EF 18%, RV EF 26%, LV thrombus, Subtle non-coronary mid-wall LGE pattern in the inferior and anterolateral walls, possible prior myocarditis. - RHC with CI 2.48 on milrinone 0.25.  Now off milrinone and NE with co-ox stable at 66%.  - Continue losartan 25 mg daily, no BP room to transition to [employer].  - Continue dapagliflozin 10 daily.  -  Continue spironolactone 25 mg daily.   - Continue digoxin, level 0.4 recently.  - Off ivabradine with PMVT.  - No beta blocker due to shock. - Overall picture concerning. 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  She is someone we may need to consider advanced therapies however RV dysfunction precludes VAD. BMI (35) is borderline for transplant  2. Mitral valve regurgitation - Severe on echo this admission, only looked mild on cMRI however.  - Likely secondary, valve does not look abnormal on echo.   3. Moderate-severe TR - Likely secondary - Due to RV dilation (functional)  4. NSVT => episodes of PMVT - She had been on amiodarone gtt for NSVT, prolonged QTc and developed PMVT.  Now off amiodarone, Corlanor, and milrinone.  Transiently on lidocaine.  No further PMVT.   - Keep K > 4, Mg > 2.  - Has been fit for LifeVest  5. Hemoptysis/RLL pneumonia - Possible PE noted on imaging at OSH. Suspect course of events likely viral illness/myocarditis > inactivity, followed by PE and pneumonia. Venous duplex negative for DVT. - CT chest here with consolidation both lower lobes. Ground glass opacities right middle lobe. - Was on ceftriaxone, now stopped by CCM.  - Suspect hemoptysis was due to elevated left atrial pressure.  - WBC persistently elevated 18-20k - With ongoing leukocytosis and dense right base consolidation on cMRI as well as green sputum and PCT 0.41,  covering for HCAP with cefepime. MRSA nasal swab was negative, but GPCs noted on sputum culture => vancomycin added   6. RLL PE - Seen on imaging at [facility] (CTA chest).  - On apixaban now.   6. Hypothyroidism -Recently diagnosed. -TSH 13.5/Free T4 1.39. -On synthroid PTA.   7. Elevated INR - Likely due to passive congestion from CHF. - Given Vit K 6/29, 6/30, and 7/1 per CCM.  8. Hyponatremia - Resolved.  - Restrict free water  9. Acute blood loss anemia -Hgb stable ~ 9.0 -Presented with massive hemoptysis which is now improved, suspect due to combination of PE, PNA and elevated LA pressure.  10. 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  SRA pending - Patient is currently on apixaban, plts up to 197 today.   11. LV thrombus - On apixaban   Arvilla Meres, MD  3:47 PM 02/18/2021

## 2021-02-19 ENCOUNTER — Other Ambulatory Visit (HOSPITAL_COMMUNITY): Payer: Self-pay

## 2021-02-19 DIAGNOSIS — D62 Acute posthemorrhagic anemia: Secondary | ICD-10-CM

## 2021-02-19 LAB — CBC
HCT: 23.5 % — ABNORMAL LOW (ref 36.0–46.0)
Hemoglobin: 7.3 g/dL — ABNORMAL LOW (ref 12.0–15.0)
MCH: 25.3 pg — ABNORMAL LOW (ref 26.0–34.0)
MCHC: 31.1 g/dL (ref 30.0–36.0)
MCV: 81.6 fL (ref 80.0–100.0)
Platelets: 272 10*3/uL (ref 150–400)
RBC: 2.88 MIL/uL — ABNORMAL LOW (ref 3.87–5.11)
RDW: 18.6 % — ABNORMAL HIGH (ref 11.5–15.5)
WBC: 23.4 10*3/uL — ABNORMAL HIGH (ref 4.0–10.5)
nRBC: 0 % (ref 0.0–0.2)

## 2021-02-19 LAB — IRON AND TIBC
Iron: 18 ug/dL — ABNORMAL LOW (ref 28–170)
Saturation Ratios: 8 % — ABNORMAL LOW (ref 10.4–31.8)
TIBC: 234 ug/dL — ABNORMAL LOW (ref 250–450)
UIBC: 216 ug/dL

## 2021-02-19 LAB — COOXEMETRY PANEL
Carboxyhemoglobin: 1.7 % — ABNORMAL HIGH (ref 0.5–1.5)
Methemoglobin: 0.9 % (ref 0.0–1.5)
O2 Saturation: 60.5 %
Total hemoglobin: 8.5 g/dL — ABNORMAL LOW (ref 12.0–16.0)

## 2021-02-19 LAB — BASIC METABOLIC PANEL
Anion gap: 8 (ref 5–15)
BUN: 28 mg/dL — ABNORMAL HIGH (ref 6–20)
CO2: 27 mmol/L (ref 22–32)
Calcium: 7.5 mg/dL — ABNORMAL LOW (ref 8.9–10.3)
Chloride: 98 mmol/L (ref 98–111)
Creatinine, Ser: 0.98 mg/dL (ref 0.44–1.00)
GFR, Estimated: >60 mL/min (ref >60)
Glucose, Bld: 97 mg/dL (ref 70–99)
Potassium: 3.6 mmol/L (ref 3.5–5.1)
Sodium: 133 mmol/L — ABNORMAL LOW (ref 135–145)

## 2021-02-19 LAB — RETICULOCYTES
Immature Retic Fract: 24.9 % — ABNORMAL HIGH (ref 2.3–15.9)
RBC.: 3.6 MIL/uL — ABNORMAL LOW (ref 3.87–5.11)
Retic Count, Absolute: 85.3 10*3/uL (ref 19.0–186.0)
Retic Ct Pct: 2.4 % (ref 0.4–3.1)

## 2021-02-19 LAB — HEPATIC FUNCTION PANEL
ALT: 40 U/L (ref 0–44)
AST: 36 U/L (ref 15–41)
Albumin: 1.8 g/dL — ABNORMAL LOW (ref 3.5–5.0)
Alkaline Phosphatase: 61 U/L (ref 38–126)
Bilirubin, Direct: 0.7 mg/dL — ABNORMAL HIGH (ref 0.0–0.2)
Indirect Bilirubin: 1.1 mg/dL — ABNORMAL HIGH (ref 0.3–0.9)
Total Bilirubin: 1.8 mg/dL — ABNORMAL HIGH (ref 0.3–1.2)
Total Protein: 5.2 g/dL — ABNORMAL LOW (ref 6.5–8.1)

## 2021-02-19 LAB — PREPARE RBC (CROSSMATCH)

## 2021-02-19 LAB — GLUCOSE, CAPILLARY
Glucose-Capillary: 77 mg/dL (ref 70–99)
Glucose-Capillary: 84 mg/dL (ref 70–99)
Glucose-Capillary: 82 mg/dL (ref 70–99)
Glucose-Capillary: 78 mg/dL (ref 70–99)
Glucose-Capillary: 88 mg/dL (ref 70–99)

## 2021-02-19 LAB — MAGNESIUM: Magnesium: 2 mg/dL (ref 1.7–2.4)

## 2021-02-19 LAB — VITAMIN B12: Vitamin B-12: 253 pg/mL (ref 180–914)

## 2021-02-19 LAB — PROTIME-INR
INR: 2.7 — ABNORMAL HIGH (ref 0.8–1.2)
Prothrombin Time: 28.3 seconds — ABNORMAL HIGH (ref 11.4–15.2)

## 2021-02-19 LAB — FOLATE: Folate: 12.1 ng/mL (ref >5.9)

## 2021-02-19 LAB — FERRITIN: Ferritin: 605 ng/mL — ABNORMAL HIGH (ref 11–307)

## 2021-02-19 MED ORDER — POTASSIUM CHLORIDE CRYS ER 20 MEQ PO TBCR
40.0000 meq | EXTENDED_RELEASE_TABLET | Freq: Once | ORAL | Status: AC
  Administered 2021-02-19: 40 meq via ORAL
  Filled 2021-02-19: qty 2

## 2021-02-19 MED ORDER — SODIUM CHLORIDE 0.9% IV SOLUTION: Freq: Once | INTRAVENOUS | Status: AC

## 2021-02-19 MED ORDER — TORSEMIDE 20 MG PO TABS
40.0000 mg | ORAL_TABLET | Freq: Every day | ORAL | Status: DC
  Administered 2021-02-20: 40 mg via ORAL
  Filled 2021-02-19 (×2): qty 2

## 2021-02-19 NOTE — Progress Notes (Signed)
PROGRESS NOTE    Debra Daniel  GUY:[id number] DOB: Dec 01, 1985 DOA: 02/06/2021 PCP: Patient, No Pcp Per (Inactive)    Brief Narrative:  Debra Daniel is a 35 year old female with past medical history significant for migraine headache, obesity recent diagnosis of hypothyroidism and recent nausea/vomiting with UTI symptoms treated with antibiotics outpatient who initially presented to [facility] on 6/27 with progressive lower extremity edema, hemoptysis.  Evaluation in outside facility ED with CTA chest and CT abdomen/pelvis notable for right-sided segmental pulmonary embolism.  She was initially admitted at [facility] started on heparin drip for pulmonary embolism which caused her hemoptysis become progressively worse; heparin was subsequently stopped.  TTE performed with LVEF 25-30% as well as moderate/severe mitral regurgitation and she was transferred to [facility] on 6/28 under PCCM service for further evaluation and care due to hypotension and hemoptysis.   Assessment & Plan:   Active Problems:   Hemoptysis   Acute systolic CHF (congestive heart failure) (HCC)   Pulmonary embolism (HCC)   Hypothyroidism   Tachycardia   Cardiogenic shock (HCC)   Acute systolic congestive heart failure with cardiogenic shock Nonischemic cardiomyopathy with biventricular heart failure Patient presenting as a transfer from [facility] after being found with reduced LVEF of 25-30% with progressive lower extremity edema, TTE on 6/28 2022 with LVEF 10-15%, mildly dilated LV, moderate to severely reduced RV function with mildly dilated RV with RVSP 45 mmHg severe MR and moderate/severe TR.  Suspected etiology from her underlying hypothyroidism versus viral myocarditis versus familial cardiomyopathy.  Initially requiring vasopressors with norepinephrine and milrinone drip and Lasix drip which have been discontinued. --Cardiology/heart failure service following, appreciate  assistance --Net negative 1.3L past 24h and negative 9.4L since admission --Digoxin 0.125 mg p.o. daily --Torsemide 40 mg p.o. twice daily --Losartan 25 mg p.o. daily --Spironolactone 25 mg p.o. daily --Dapagliflozin 10mg  PO daily --Strict I's and O's and daily weights  NSVT/torsades de points V. fib arrest Multifocal atrial tachycardia Patient's developed intraventricular fibrillation arrest requiring CPR for 4 minutes with defibrillation x2 on 02/13/2021 with ROSC achieved. 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  Was placed on bivalirudin drip for HIT (positive heparin-induced platelet antibodies, 2.313 on 7/6).  Lower extremity Doppler ultrasounds negative for DVT on 6/29 2022. --Bivalirudin drip transition to Eliquis 10 mg p.o. BID x 7d followed by 5mg  PO BID, hemoglobin stable, platelet level rising; 272 today --Serotonin release assay: Pending --Hemoglobin down to 9.0>7.3 today --Transfuse 1 unit PRBC today, goal hemoglobin >8.0 given cardiac history --CBC daily  Hypothyroidism TSH 13.5 and FT4 1.39 --recently started Levothyroxine 2023 PO daily --will need repeat TFTs 4 weeks  Elevated LFTs: Resolved Elevated INR Etiology likely secondary to hepatic congestion from biventricular congestive heart failure. 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 --Hemoglobin stable, 9.3 this morning --Heparin discontinued, bilirubin drip transition to Eliquis on 7/8 --CBC daily  Community-acquired pneumonia CT chest 6/29 with areas of consolidation  right lower lobe, left lower lobe and right middle lobe compatible with multifocal pneumonia.   --WBC 14.9>>19.6>>10.7>>17.2>19.6>20.8>23.4; procalcitonin 0.31>0.34 today --Sputum culture 7/7 with normal flora, no staph/pseudomonas noted --vancomycin, cefepime --Monitor CBC daily --  Repeat PCT in am  DVT prophylaxis: Place TED hose Start: 02/09/21 1516 SCDs Start: 02/07/21 0821 apixaban (ELIQUIS) tablet 10 mg  apixaban (ELIQUIS) tablet 5 mg    Code Status: Full Code Family Communication: No family present at bedside this morning  Disposition Plan:  Level of care: Telemetry Cardiac Status is: Inpatient  Remains inpatient appropriate because:Ongoing diagnostic testing needed not appropriate for outpatient work up, Unsafe d/c plan, IV treatments appropriate due to intensity of illness or inability to take PO, and Inpatient level of care appropriate due to severity of illness  Dispo: The patient is from: Home              Anticipated d/c is to: Home              Patient currently is not medically stable to d/c.   Difficult to place patient No   Consultants:  PCCM -signed off 7/9 Cardiology/heart failure service Electrophysiology - signed off 7/8  Procedures:  TTE 6/28: LVEF 10-15%, LV global hypokinesis, LV moderately dilated, RV systolic function severely reduced, LA mildly dilated, severe MR, severe TR, IVC normal in size Vascular duplex ultrasound bilateral lower extremities 6/29: Negative for DVT Right/left heart catheterization 7/5; no significant CAD, mildly elevated filling pressures, pulmonary venous hypertension.  Antimicrobials:  Vancomycin 6/28 - 6/29, 7/9>> Cefepime 6/28 - 6/29, 7/8>> Ceftriaxone 6/29 - 7/3 Doxycycline 6/29 - 7/1   Subjective: Patient seen examined bedside, resting comfortably.  Sitting in bedside chair.  No complaints this morning.  Was fitted for LifeVest yesterday; states she understands how to utilize the device.  Denies headache, no dizziness,  no chest pain, palpitations, no shortness of breath, no abdominal pain.  No acute events overnight per nursing staff.  Objective: Vitals:   02/19/21 0400 02/19/21 0500 02/19/21 0731 02/19/21 1009  BP: 102/70  93/69 111/75  Pulse: (!) 103  (!) 108 (!) 112  Resp: (!) 23   20  Temp: 98.4 F (36.9 C)  98.5 F (36.9 C)   TempSrc: Oral  Oral   SpO2: 92%  96% 100%  Weight:  91.6 kg    Height:        Intake/Output Summary (Last 24 hours) at 02/19/2021 1139 Last data filed at 02/19/2021 1108 Gross per 24 hour  Intake 1835 ml  Output 850 ml  Net 985 ml   Filed Weights   02/17/21 0500 02/18/21 0500 02/19/21 0500  Weight: 95.1 kg 93.3 kg 91.6 kg    Examination:  General exam: Appears calm and comfortable, obese Respiratory system: Clear to auscultation. Respiratory effort normal.  On room air Cardiovascular system: S1 & S2 heard, RRR. No JVD, murmurs, rubs, gallops or clicks. 1-2+ pitting lower extremity edema to mid-shin Gastrointestinal system: Abdomen is nondistended, soft and nontender. No organomegaly or masses felt. Normal bowel sounds heard. Central nervous system: Alert and oriented. No focal neurological deficits. Extremities: Symmetric 5 x 5 power. Skin: No rashes, lesions or ulcers Psychiatry: Judgement and insight appear normal. Mood & affect appropriate.     Data Reviewed: I have personally reviewed following labs and imaging studies  CBC: Recent Labs  Lab 02/15/21 0427 02/16/21 0422 02/17/21 0409 02/18/21 0511 02/19/21 0500  WBC 17.5* 17.2* 19.6* 20.8* 23.4*  HGB 9.5* 9.0* 9.3* 9.0* 7.3*  HCT 30.2* 29.0* 29.4* 29.0* 23.5*  MCV 81.8 82.2 81.4 81.9 81.6  PLT 121* 113* 154 197 272   Basic Metabolic Panel: Recent Labs  Lab 02/15/21 1240 02/16/21 0422 02/17/21 0409 02/17/21 1100  02/17/21 2000 02/18/21 0511 02/19/21 0500  NA 133* 132* 134*  --   --  137 133*  K 3.8 4.0 3.7 3.7 4.1 4.2 3.6  CL 97* 97* 97*  --   --  99 98  CO2 28 29 28   --   --  28 27   GLUCOSE 146* 133* 99  --   --  97 97  BUN 15 17 17   --   --  19 28*  CREATININE 0.92 0.88 0.88  --   --  0.90 0.98  CALCIUM 7.9* 7.8* 7.9*  --   --  8.2* 7.5*  MG 2.0 1.7 2.0  --   --  1.9 2.0   GFR: Estimated Creatinine Clearance: 90.5 mL/min (by C-G formula based on SCr of 0.98 mg/dL). Liver Function Tests: Recent Labs  Lab 02/13/21 2139 02/17/21 0409 02/18/21 0511 02/19/21 0500  AST 58* 44* 56* 36  ALT 41 39 45* 40  ALKPHOS 64 60 66 61  BILITOT 2.0* 1.9* 2.0* 1.8*  PROT 5.4* 4.8* 5.3* 5.2*  ALBUMIN 1.9* 1.8* 1.9* 1.8*   No results for input(s): LIPASE, AMYLASE in the last 168 hours. No results for input(s): AMMONIA in the last 168 hours. Coagulation Profile: Recent Labs  Lab 02/18/21 0511 02/19/21 0500  INR 2.4* 2.7*   Cardiac Enzymes: No results for input(s): CKTOTAL, CKMB, CKMBINDEX, TROPONINI in the last 168 hours. BNP (last 3 results) No results for input(s): PROBNP in the last 8760 hours. HbA1C: No results for input(s): HGBA1C in the last 72 hours. CBG: Recent Labs  Lab 02/17/21 1547 02/18/21 0740 02/18/21 1745 02/18/21 2107 02/19/21 0729  GLUCAP 84 119* 101* 113* 77   Lipid Profile: No results for input(s): CHOL, HDL, LDLCALC, TRIG, CHOLHDL, LDLDIRECT in the last 72 hours. Thyroid Function Tests: No results for input(s): TSH, T4TOTAL, FREET4, T3FREE, THYROIDAB in the last 72 hours. Anemia Panel: Recent Labs    02/19/21 0843  RETICCTPCT 2.4   Sepsis Labs: Recent Labs  Lab 02/13/21 2143 02/14/21 0520 02/16/21 1235 02/17/21 0409 02/18/21 0511  PROCALCITON  --   --  0.31 0.31 0.34  LATICACIDVEN 2.7* 1.0  --   --   --     Recent Results (from the past 240 hour(s))  SARS Coronavirus 2 by RT PCR (hospital order, performed in [facility] hospital lab) Nasopharyngeal Nasopharyngeal Swab     Status: None   Collection Time: 02/13/21  3:23 AM   Specimen: Nasopharyngeal Swab  Result Value Ref Range Status   SARS Coronavirus 2 NEGATIVE NEGATIVE  Final    Comment: (NOTE) SARS-CoV-2 target nucleic acids are NOT DETECTED.  The SARS-CoV-2 RNA is generally detectable in upper and lower respiratory specimens during the acute phase of infection. The lowest concentration of SARS-CoV-2 viral copies this assay can detect is 250 copies / mL. A negative result does not preclude SARS-CoV-2 infection and should not be used as the sole basis for treatment or other patient management decisions.  A negative result may occur with improper specimen collection / handling, submission of specimen other than nasopharyngeal swab, presence of viral mutation(s) within the areas targeted by this assay, and inadequate number of viral copies (<250 copies / mL). A negative result must be combined with clinical observations, patient history, and epidemiological information.  Fact Sheet for Patients:   BoilerBrush.com.cy  Fact Sheet for Healthcare Providers: https://pope.com/  This test is not yet approved or  cleared by the [place] FDA and has been authorized  for detection and/or diagnosis of SARS-CoV-2 by FDA under an Emergency Use Authorization (EUA).  This EUA will remain in effect (meaning this test can be used) for the duration of the COVID-19 declaration under Section 564(b)(1) of the Act, 21 U.S.C. section 360bbb-3(b)(1), unless the authorization is terminated or revoked sooner.  Performed at [facility], [address] [place], [place] [address]   Expectorated Sputum Assessment w Gram Stain, Rflx to Resp Cult     Status: None   Collection Time: 02/15/21 11:49 AM   Specimen: Expectorated Sputum  Result Value Ref Range Status   Specimen Description EXPECTORATED SPUTUM  Final   Special Requests NONE  Final   Sputum evaluation   Final    THIS SPECIMEN IS ACCEPTABLE FOR SPUTUM CULTURE Performed at [facility], [address], [place], [place] [address]    Report Status  02/16/2021 FINAL  Final  Culture, Respiratory w Gram Stain     Status: None   Collection Time: 02/15/21 11:49 AM  Result Value Ref Range Status   Specimen Description EXPECTORATED SPUTUM  Final   Special Requests NONE Reflexed from [id number]  Final   Gram Stain   Final    ABUNDANT WBC PRESENT,BOTH PMN AND MONONUCLEAR RARE GRAM POSITIVE COCCI    Culture   Final    RARE Normal respiratory flora-no Staph aureus or Pseudomonas seen Performed at [facility], [address] [place], [place] [address]    Report Status 02/18/2021 FINAL  Final         Radiology Studies: No results found.      Scheduled Meds:  apixaban  10 mg Oral BID   Followed by   Melene Muller ON 02/23/2021] apixaban  5 mg Oral BID   Chlorhexidine Gluconate Cloth  6 each Topical Daily   dapagliflozin propanediol  10 mg Oral Daily   digoxin  0.125 mg Oral Daily   insulin aspart  0-5 Units Subcutaneous QHS   insulin aspart  0-9 Units Subcutaneous TID WC   levothyroxine  75 mcg Oral Q0600   losartan  25 mg Oral Daily   mouth rinse  15 mL Mouth Rinse BID   sodium chloride flush  10-40 mL Intracatheter Q12H   sodium chloride flush  3 mL Intravenous Q12H   spironolactone  25 mg Oral Daily   [START ON 02/20/2021] torsemide  40 mg Oral Daily   Continuous Infusions:  sodium chloride 250 mL (02/15/21 0539)   sodium chloride 10 mL/hr at 02/13/21 1500   sodium chloride Stopped (02/14/21 1329)   ceFEPime (MAXIPIME) IV 2 g (02/19/21 0605)   vancomycin 1,750 mg (02/18/21 1820)     LOS: 13 days    Time spent: 39 minutes spent on chart review, discussion with nursing staff, consultants, updating family and interview/physical exam; more than 50% of that time was spent in counseling and/or coordination of care.    Alvira Philips [place], DO [facility] Available via Epic secure chat 7am-7pm After these hours, please refer to coverage provider listed on [email or web address] 02/19/2021, 11:39 AM

## 2021-02-19 NOTE — Progress Notes (Addendum)
Patient ID: Debra Daniel, female   DOB: 1985/09/17, 35 y.o.   MRN: [id number]     Advanced Heart Failure Rounding Note  PCP-Cardiologist: None   Subjective:   -05/13: Seen in ED for recurrent N/V. Treated for possible UTI. Ongoing weakness, fatigue, intermittent N/V, cough. -06/27: ED at OSH with hemoptysis. Segmental PE and pneumonia. EF 25-30%. -06/28: Transferred to [facility]. Cardiogenic shock > required norepi and milrinone. Diuresed with IV lasix. -06/29: Tachycardic. Added digoxin and spiro. Amio added d/t NSVT.  -6/30: Midodrine started and lasix drip started.  -7/5: RHC/LHC No CAD   -7/5 Developed PMVT and required defibrillation x 2 with brief CPR.  QTc prolonged, amiodarone was stopped.  She received Mg and K. Milrinone decreased to 0.125.  She had another episode PMVT, Corlanor stopped. Now off lidocaine -07/08: Milrinone D/Cd  Four runs of NSVT last 24 hrs, longest 14 beats. Fitted for LifeVest.   Hgb 9 > 7.3. Suspect in setting of starting menses. Hospitalist transfusing with 1 unit PRBCs.  No dyspnea today. Has been up walking around the room. No lightheadedness or palpitations.   Cardiac MRI: 1.  Moderately dilated LV with EF 18%, diffuse hypokinesis. 2.  LV thrombus noted. 3.  Moderately dilated RV with EF 26%. 4.  MR looks mild by cMRI, regurgitant fraction 15%. 5. Subtle non-coronary mid-wall LGE pattern in the inferior and anterolateral walls, possible prior myocarditis. 6.  Consolidation of the right lung base.  Echo 02/06/2021: LVEF 10-15%, RV moderately enlarged with severely reduced systolic function, RVSP 45.9 mmHg, severe secondary MR, mod-severe TR.    Objective:   Weight Range: 91.6 kg Body mass index is 34.14 kg/m.   Vital Signs:   Temp:  [98 F (36.7 C)-98.5 F (36.9 C)] 98.5 F (36.9 C) (07/11 0731) Pulse Rate:  [103-115] 108 (07/11 0731) Resp:  [22-30] 23 (07/11 0400) BP: (91-119)/(59-71) 93/69 (07/11 0731) SpO2:  [92 %-100 %] 96 % (07/11  0731) Weight:  [91.6 kg] 91.6 kg (07/11 0500) Last BM Date: 02/18/21  Weight change: Filed Weights   02/17/21 0500 02/18/21 0500 02/19/21 0500  Weight: 95.1 kg 93.3 kg 91.6 kg    Intake/Output:   Intake/Output Summary (Last 24 hours) at 02/19/2021 0818 Last data filed at 02/19/2021 0651 Gross per 24 hour  Intake 1595 ml  Output 550 ml  Net 1045 ml      Physical Exam  CVP 4 (personally checked) General:  Sitting in chair.  No resp distress. HEENT: normal Neck: supple. No JVD Carotids 2+ bilat; no bruits. No lymphadenopathy or thryomegaly appreciated. Cor: PMI nondisplaced. Rhythm regular, tachycardic. No murmur. Lungs: CTA bilaterally. Abdomen: soft, nontender, nondistended. No hepatosplenomegaly. No bruits or masses. Good bowel sounds. Extremities: no cyanosis, clubbing, RUE PICC line, rash,1+ lower extremity edema. Compression stockings on. Neuro: alert & orientedx3, cranial nerves grossly intact. moves all 4 extremities w/o difficulty. Affect pleasant    Telemetry  Sinus tachycardia, 100s-110s. PVCs. Several runs of nonsustained VT over last 24 hrs, up to 14 beats.  Labs    CBC Recent Labs    02/18/21 0511 02/19/21 0500  WBC 20.8* 23.4*  HGB 9.0* 7.3*  HCT 29.0* 23.5*  MCV 81.9 81.6  PLT 197 272   Basic Metabolic Panel Recent Labs    78/24/23 0511 02/19/21 0500  NA 137 133*  K 4.2 3.6  CL 99 98  CO2 28 27  GLUCOSE 97 97  BUN 19 28*  CREATININE 0.90 0.98  CALCIUM 8.2* 7.5*  MG  1.9 2.0   Liver Function Tests Recent Labs    02/18/21 0511 02/19/21 0500  AST 56* 36  ALT 45* 40  ALKPHOS 66 61  BILITOT 2.0* 1.8*  PROT 5.3* 5.2*  ALBUMIN 1.9* 1.8*    No results for input(s): LIPASE, AMYLASE in the last 72 hours. Cardiac Enzymes No results for input(s): CKTOTAL, CKMB, CKMBINDEX, TROPONINI in the last 72 hours.  BNP: BNP (last 3 results) Recent Labs    02/06/21 2018  BNP 1,571.8*    ProBNP (last 3 results) No results for input(s):  PROBNP in the last 8760 hours.   D-Dimer No results for input(s): DDIMER in the last 72 hours.  Hemoglobin A1C No results for input(s): HGBA1C in the last 72 hours.  Fasting Lipid Panel No results for input(s): CHOL, HDL, LDLCALC, TRIG, CHOLHDL, LDLDIRECT in the last 72 hours. Thyroid Function Tests No results for input(s): TSH, T4TOTAL, T3FREE, THYROIDAB in the last 72 hours.  Invalid input(s): FREET3   Other results:   Imaging    No results found.   Medications:     Scheduled Medications:  apixaban  10 mg Oral BID   Followed by   Melene Muller ON 02/23/2021] apixaban  5 mg Oral BID   Chlorhexidine Gluconate Cloth  6 each Topical Daily   dapagliflozin propanediol  10 mg Oral Daily   digoxin  0.125 mg Oral Daily   insulin aspart  0-5 Units Subcutaneous QHS   insulin aspart  0-9 Units Subcutaneous TID WC   levothyroxine  75 mcg Oral Q0600   losartan  25 mg Oral Daily   mouth rinse  15 mL Mouth Rinse BID   potassium chloride  40 mEq Oral Once   sodium chloride flush  10-40 mL Intracatheter Q12H   sodium chloride flush  3 mL Intravenous Q12H   spironolactone  25 mg Oral Daily   torsemide  40 mg Oral BID    Infusions:  sodium chloride 250 mL (02/15/21 0539)   sodium chloride 10 mL/hr at 02/13/21 1500   sodium chloride Stopped (02/14/21 1329)   ceFEPime (MAXIPIME) IV 2 g (02/19/21 1884)   vancomycin 1,750 mg (02/18/21 1820)    PRN Medications: sodium chloride, sodium chloride, acetaminophen, docusate sodium, lip balm, sodium chloride flush    Assessment/Plan   Acute systolic HF -> Cardiogenic shock/acute biventricular heart failure/new cardiomyopathy -New diagnosis of CHF this admission after presenting with hemoptysis at OSH in [place]. Suspected viral illness with > 1 month hx weakness, fatigue, cough, N/V. HIV negative. Recently diagnosed hypothyroidism.  Etiology => viral myocarditis versus familial cardiomyopathy (grandmother and aunts/uncles with CHF).   -No  CAD on LHC - Echo here with EF of 10-15%, moderate to severely reduced RV fxn with mildly dilated RV, RVSP 45 mmHg, severe secondary MR, mod-severe TR. - cMRI with LV EF 18%, RV EF 26%, LV thrombus, Subtle non-coronary mid-wall LGE pattern in the inferior and anterolateral walls, possible prior myocarditis. - RHC with CI 2.48 on milrinone 0.25.  Now off milrinone and NE with co-ox stable at 60%. -CVP 4. BUN trending up. Will cut back torsemide to 40 mg daily. - Continue losartan 25 mg daily, no BP room to transition to [employer].  - Continue dapagliflozin 10 daily.  - Continue spironolactone 25 mg daily.   - Continue digoxin, level 0.4 recently.  - Off ivabradine with PMVT.  - No beta blocker due to shock. - Overall picture concerning. 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  She is someone we may need  to consider advanced therapies for, however RV dysfunction precludes VAD. BMI (35) is borderline for transplant  2. Mitral valve regurgitation - Severe on echo this admission, only looked mild on cMRI however.  - Likely secondary, valve does not look abnormal on echo.   3. Moderate-severe TR - Likely secondary - Due to RV dilation (functional)  4. NSVT => episodes of PMVT - She had been on amiodarone gtt for NSVT, prolonged QTc and developed PMVT.  Now off amiodarone, Corlanor, and milrinone.  Transiently on lidocaine.   - Several episodes NSVT last 24 hr, up to 14 beats - K 3.6 > replace. Keep > 4. - Keep Mg > 2.  - Has been fit for LifeVest  5. Hemoptysis/RLL pneumonia - Possible PE noted on imaging at OSH. Suspect course of events likely viral illness/myocarditis > inactivity, followed by PE and pneumonia. Venous duplex negative for DVT. - Suspect hemoptysis was due to elevated left atrial pressure, PNA and PE.  - CT chest with consolidation both lower lobes. Ground glass opacities right middle lobe. - WBC continues to trend up, 17-23K - With ongoing leukocytosis and dense right base consolidation on  cMRI as well as green sputum and PCT 0.41, covering for HCAP with cefepime. MRSA nasal swab was negative, but GPCs noted on sputum culture => vancomycin added   6. RLL PE - Seen on imaging at [facility] (CTA chest).  - On apixaban now.   6. Hypothyroidism -Recently diagnosed. -TSH 13.5/Free T4 1.39. -On synthroid   7. Elevated INR - Likely due to passive congestion from CHF. - Given Vit K 6/29, 6/30, and 7/1 per CCM.  8. Hyponatremia - Resolved.  - Restrict free water  9. Acute blood loss anemia -Hgb stable ~ 9.0 > 7.3. suspect d/t menses. -Agree with transfusing. -Presented with massive hemoptysis which is now improved  10. 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  SRA pending - Currently on apixaban, plts up to 272 today.   11. LV thrombus - On apixaban   Consult cardiac rehab Encourage ambulation   [facility], Dalbert Garnet, PA-C  8:18 AM 02/19/2021  Patient seen and examined with the above-signed Advanced Practice Provider and/or Housestaff. I personally reviewed laboratory data, imaging studies and relevant notes. I independently examined the patient and formulated the important aspects of the plan. I have edited the note to reflect any of my changes or salient points. I have personally discussed the plan with the patient and/or family.  Had NSVT overnight. Breathing better. CVP low. Co-ox 61% Hgb down   General:  Sitting in bed No resp difficulty HEENT: normal Neck: supple. no JVD. Carotids 2+ bilat; no bruits. No lymphadenopathy or thryomegaly appreciated. Cor: PMI nondisplaced. Regular rate & rhythm. No rubs, gallops or murmurs. Lungs: clear Abdomen: obese soft, nontender, nondistended. No hepatosplenomegaly. No bruits or masses. Good bowel sounds. Extremities: no cyanosis, clubbing, rash, edema Neuro: alert & orientedx3, cranial nerves grossly intact. moves all 4 extremities w/o difficulty. Affect pleasant  Remains tenuous. Agree with cutting back torsemide. Supp K. Transfuse  1uRBCs. Continue to ambulate. Has lifevest when ready for d/c.   Arvilla Meres, MD  5:48 PM

## 2021-02-19 NOTE — Care Management (Signed)
1549 02-19-21 Case Manager spoke with patient and she is without a PCP at this time. Case Manager suggested that she call the [facility] Department to see if they are accepting new patients. Patient is currently not working and is without insurance. Patient was previously using [facility] in [place] and her new pharmacy will be [facility] at [place]. [facility] in [place]. Patient is willing to have any new Rx's filled at the [facility]. Patient will benefit from [facility] once she is stable to transition home.

## 2021-02-19 NOTE — Progress Notes (Signed)
Mobility Specialist: Progress Note   02/19/21 1812  Mobility  Activity Ambulated in hall  Level of Assistance Independent  Assistive Device None  Distance Ambulated (ft) 470 ft  Mobility Ambulated independently in hallway  Mobility Response Tolerated well  Mobility performed by Mobility specialist  Bed Position Chair  $Mobility charge 1 Mobility   Pre-Mobility: 116 HR, 103/72 BP, 100% SpO2 Post-Mobility: 124 HR, 100% SpO2  Pt asx throughout ambulation. Pt back to recliner after walk with family members present in the room.   [facility] Debra Daniel Mobility Specialist Mobility Specialist Phone: [phone]

## 2021-02-19 NOTE — Progress Notes (Signed)
[id number] Pt just getting blood transfusion so did not walk. Sitting in recliner. Offered to get legs up as are swollen but pt stated hurts her back. Offered to get pillows for back or chair to put legs up on.  Pt stated she was ok. Gave CHF booklet and reviewed the zones with pt and her family. Discussed when to call MD with weight gain and signs to watch for. Pt does not have scales at home for daily weights.  Gave low sodium diets and discussed adhering to 2000 mg sodium restriction. Will follow up tomorrow and walk and continue ed. Luetta Nutting RN BSN 02/19/2021 1:48 PM

## 2021-02-20 ENCOUNTER — Inpatient Hospital Stay (HOSPITAL_COMMUNITY): Payer: Medicaid Other

## 2021-02-20 DIAGNOSIS — D62 Acute posthemorrhagic anemia: Secondary | ICD-10-CM

## 2021-02-20 LAB — HEPATIC FUNCTION PANEL
ALT: 51 U/L — ABNORMAL HIGH (ref 0–44)
AST: 73 U/L — ABNORMAL HIGH (ref 15–41)
Albumin: 1.8 g/dL — ABNORMAL LOW (ref 3.5–5.0)
Alkaline Phosphatase: 68 U/L (ref 38–126)
Bilirubin, Direct: 0.8 mg/dL — ABNORMAL HIGH (ref 0.0–0.2)
Indirect Bilirubin: 1.4 mg/dL — ABNORMAL HIGH (ref 0.3–0.9)
Total Bilirubin: 2.2 mg/dL — ABNORMAL HIGH (ref 0.3–1.2)
Total Protein: 5.3 g/dL — ABNORMAL LOW (ref 6.5–8.1)

## 2021-02-20 LAB — SEROTONIN RELEASE ASSAY (SRA)
SRA .2 IU/mL UFH Ser-aCnc: 100 % — ABNORMAL HIGH (ref 0–20)
SRA .2 IU/mL UFH Ser-aCnc: 99 % — ABNORMAL HIGH (ref 0–20)
SRA 100IU/mL UFH Ser-aCnc: <1 % (ref 0–20)
SRA 100IU/mL UFH Ser-aCnc: <1 % (ref 0–20)

## 2021-02-20 LAB — BASIC METABOLIC PANEL
Anion gap: 7 (ref 5–15)
BUN: 21 mg/dL — ABNORMAL HIGH (ref 6–20)
CO2: 23 mmol/L (ref 22–32)
Calcium: 8 mg/dL — ABNORMAL LOW (ref 8.9–10.3)
Chloride: 108 mmol/L (ref 98–111)
Creatinine, Ser: 0.81 mg/dL (ref 0.44–1.00)
GFR, Estimated: >60 mL/min (ref >60)
Glucose, Bld: 89 mg/dL (ref 70–99)
Potassium: 4.2 mmol/L (ref 3.5–5.1)
Sodium: 138 mmol/L (ref 135–145)

## 2021-02-20 LAB — GLUCOSE, CAPILLARY
Glucose-Capillary: 120 mg/dL — ABNORMAL HIGH (ref 70–99)
Glucose-Capillary: 131 mg/dL — ABNORMAL HIGH (ref 70–99)
Glucose-Capillary: 98 mg/dL (ref 70–99)
Glucose-Capillary: 92 mg/dL (ref 70–99)
Glucose-Capillary: 110 mg/dL — ABNORMAL HIGH (ref 70–99)

## 2021-02-20 LAB — CBC
HCT: 30 % — ABNORMAL LOW (ref 36.0–46.0)
Hemoglobin: 9.7 g/dL — ABNORMAL LOW (ref 12.0–15.0)
MCH: 26.1 pg (ref 26.0–34.0)
MCHC: 32.3 g/dL (ref 30.0–36.0)
MCV: 80.6 fL (ref 80.0–100.0)
Platelets: 275 10*3/uL (ref 150–400)
RBC: 3.72 MIL/uL — ABNORMAL LOW (ref 3.87–5.11)
RDW: 17.7 % — ABNORMAL HIGH (ref 11.5–15.5)
WBC: 19 10*3/uL — ABNORMAL HIGH (ref 4.0–10.5)
nRBC: 0 % (ref 0.0–0.2)

## 2021-02-20 LAB — COOXEMETRY PANEL
Carboxyhemoglobin: 1.9 % — ABNORMAL HIGH (ref 0.5–1.5)
Methemoglobin: 1.1 % (ref 0.0–1.5)
O2 Saturation: 59.6 %
Total hemoglobin: 9.4 g/dL — ABNORMAL LOW (ref 12.0–16.0)

## 2021-02-20 LAB — TYPE AND SCREEN
ABO/RH(D): A POS
Antibody Screen: NEGATIVE
Unit division: 0

## 2021-02-20 LAB — PROTIME-INR
INR: 2.3 — ABNORMAL HIGH (ref 0.8–1.2)
Prothrombin Time: 25.6 seconds — ABNORMAL HIGH (ref 11.4–15.2)

## 2021-02-20 LAB — BPAM RBC
Blood Product Expiration Date: 202208012359
ISSUE DATE / TIME: 202207111258
Unit Type and Rh: 6200

## 2021-02-20 LAB — MAGNESIUM: Magnesium: 2 mg/dL (ref 1.7–2.4)

## 2021-02-20 MED ORDER — TORSEMIDE 20 MG PO TABS
20.0000 mg | ORAL_TABLET | Freq: Every day | ORAL | Status: DC
  Administered 2021-02-21 – 2021-02-22 (×2): 20 mg via ORAL
  Filled 2021-02-20 (×2): qty 1

## 2021-02-20 MED ORDER — SACUBITRIL-VALSARTAN 24-26 MG PO TABS: 1.0000 | ORAL_TABLET | Freq: Two times a day (BID) | ORAL | Status: DC

## 2021-02-20 MED ORDER — SACUBITRIL-VALSARTAN 24-26 MG PO TABS
1.0000 | ORAL_TABLET | Freq: Two times a day (BID) | ORAL | Status: DC
  Administered 2021-02-21 – 2021-02-22 (×2): 1 via ORAL
  Filled 2021-02-20 (×2): qty 1

## 2021-02-20 NOTE — Progress Notes (Signed)
Pharmacy Antibiotic Note  Debra Daniel is a 35 y.o. female admitted on 02/06/2021 with R sided PE causing hemoptysis now with possible hospital acquired pneumonia.  Pharmacy has been consulted for vancomycin and cefepime dosing. Sputum culture negative with normal flora - will complete 5 day course per Dr [place].  Plan: Cefepime 2g IV q8h through today Vancomycin 1750mg  IV q24h through tomorrow  Height: 5' 4.5" (163.8 cm) Weight: 92.4 kg (203 lb 11.3 oz) IBW/kg (Calculated) : 55.85  Temp (24hrs), Avg:98.2 F (36.8 C), Min:97.9 F (36.6 C), Max:98.6 F (37 C)  Recent Labs  Lab 02/13/21 2143 02/13/21 2206 02/14/21 0520 02/14/21 0805 02/16/21 0422 02/17/21 0409 02/18/21 0511 02/19/21 0500 02/20/21 0440  WBC  --    < >  --    < > 17.2* 19.6* 20.8* 23.4* 19.0*  CREATININE  --   --  0.89   < > 0.88 0.88 0.90 0.98 0.81  LATICACIDVEN 2.7*  --  1.0  --   --   --   --   --   --    < > = values in this interval not displayed.     Estimated Creatinine Clearance: 109.9 mL/min (by C-G formula based on SCr of 0.81 mg/dL).    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   7/28, PharmD, BCPS, [facility] Clinical Pharmacist [phone] Please check AMION for all [facility] Pharmacy numbers 02/20/2021

## 2021-02-20 NOTE — Progress Notes (Addendum)
Patient ID: Debra Daniel, female   DOB: 04/13/1986, 35 y.o.   MRN: [id number]     Advanced Heart Failure Rounding Note  PCP-Cardiologist: Dr. Teressa Lower  Subjective:   -05/13: Seen in ED for recurrent N/V. Treated for possible UTI. Ongoing weakness, fatigue, intermittent N/V, cough. -06/27: ED at OSH with hemoptysis. Segmental PE and pneumonia. EF 25-30%. -06/28: Transferred to [facility]. Cardiogenic shock > required norepi and milrinone. Diuresed with IV lasix. -06/29: Tachycardic. Added digoxin and spiro. Amio added d/t NSVT.  -6/30: Midodrine started and lasix drip started.  -7/5: RHC/LHC No CAD   -7/5 Developed PMVT and required defibrillation x 2 with brief CPR.  QTc prolonged, amiodarone was stopped.  She received Mg and K. Milrinone decreased to 0.125.  She had another episode PMVT, Corlanor stopped. Now off lidocaine -07/08: Milrinone D/Cd  Fitted for LifeVest.   Co-Ox borderline, 59% today. CVP 4-5  No significant dyspnea. Reports more hemoptysis last night and this am. Walked the halls without issue.  Cardiac MRI: 1.  Moderately dilated LV with EF 18%, diffuse hypokinesis. 2.  LV thrombus noted. 3.  Moderately dilated RV with EF 26%. 4.  MR looks mild by cMRI, regurgitant fraction 15%. 5. Subtle non-coronary mid-wall LGE pattern in the inferior and anterolateral walls, possible prior myocarditis. 6.  Consolidation of the right lung base.  Echo 02/06/2021: LVEF 10-15%, RV moderately enlarged with severely reduced systolic function, RVSP 45.9 mmHg, severe secondary MR, mod-severe TR.    Objective:   Weight Range: 92.4 kg Body mass index is 34.43 kg/m.   Vital Signs:   Temp:  [97.9 F (36.6 C)-98.6 F (37 C)] 97.9 F (36.6 C) (07/12 0756) Pulse Rate:  [102-117] 115 (07/12 0756) Resp:  [18-23] 18 (07/12 0739) BP: (91-116)/(64-83) 103/64 (07/12 0756) SpO2:  [96 %-100 %] 100 % (07/12 0756) Weight:  [92.4 kg] 92.4 kg (07/12 0500) Last BM Date: 02/19/21  Weight  change: Filed Weights   02/18/21 0500 02/19/21 0500 02/20/21 0500  Weight: 93.3 kg 91.6 kg 92.4 kg    Intake/Output:   Intake/Output Summary (Last 24 hours) at 02/20/2021 0836 Last data filed at 02/20/2021 0700 Gross per 24 hour  Intake 2320.18 ml  Output 1500 ml  Net 820.18 ml      Physical Exam  CVP 4-5 General:  Sitting in chair.  No resp distress. HEENT: normal Neck: supple. No JVD Carotids 2+ bilat; no bruits. No lymphadenopathy or thryomegaly appreciated. Cor: PMI nondisplaced. Rhythm regular, tachycardic. No murmur. Lungs: CTA bilaterally. Abdomen: soft, nontender, nondistended. No hepatosplenomegaly. No bruits or masses. Good bowel sounds. Extremities: no cyanosis, clubbing, RUE PICC line, rash,1+ lower extremity edema. Compression stockings on. Neuro: alert & orientedx3, cranial nerves grossly intact. moves all 4 extremities w/o difficulty. Affect pleasant    Telemetry  Sinus tachycardia with rates 110s-120s  Labs    CBC Recent Labs    02/19/21 0500 02/20/21 0440  WBC 23.4* 19.0*  HGB 7.3* 9.7*  HCT 23.5* 30.0*  MCV 81.6 80.6  PLT 272 275   Basic Metabolic Panel Recent Labs    15/40/08 0500 02/20/21 0440  NA 133* 138  K 3.6 4.2  CL 98 108  CO2 27 23  GLUCOSE 97 89  BUN 28* 21*  CREATININE 0.98 0.81  CALCIUM 7.5* 8.0*  MG 2.0 2.0   Liver Function Tests Recent Labs    02/19/21 0500 02/20/21 0440  AST 36 73*  ALT 40 51*  ALKPHOS 61 68  BILITOT 1.8* 2.2*  PROT 5.2* 5.3*  ALBUMIN 1.8* 1.8*    No results for input(s): LIPASE, AMYLASE in the last 72 hours. Cardiac Enzymes No results for input(s): CKTOTAL, CKMB, CKMBINDEX, TROPONINI in the last 72 hours.  BNP: BNP (last 3 results) Recent Labs    02/06/21 2018  BNP 1,571.8*    ProBNP (last 3 results) No results for input(s): PROBNP in the last 8760 hours.   D-Dimer No results for input(s): DDIMER in the last 72 hours.  Hemoglobin A1C No results for input(s): HGBA1C in the  last 72 hours.  Fasting Lipid Panel No results for input(s): CHOL, HDL, LDLCALC, TRIG, CHOLHDL, LDLDIRECT in the last 72 hours. Thyroid Function Tests No results for input(s): TSH, T4TOTAL, T3FREE, THYROIDAB in the last 72 hours.  Invalid input(s): FREET3   Other results:   Imaging    No results found.   Medications:     Scheduled Medications:  apixaban  10 mg Oral BID   Followed by   Melene Muller ON 02/23/2021] apixaban  5 mg Oral BID   Chlorhexidine Gluconate Cloth  6 each Topical Daily   dapagliflozin propanediol  10 mg Oral Daily   digoxin  0.125 mg Oral Daily   insulin aspart  0-5 Units Subcutaneous QHS   insulin aspart  0-9 Units Subcutaneous TID WC   levothyroxine  75 mcg Oral Q0600   losartan  25 mg Oral Daily   mouth rinse  15 mL Mouth Rinse BID   sodium chloride flush  10-40 mL Intracatheter Q12H   sodium chloride flush  3 mL Intravenous Q12H   spironolactone  25 mg Oral Daily   torsemide  40 mg Oral Daily    Infusions:  sodium chloride 250 mL (02/15/21 0539)   sodium chloride 10 mL/hr at 02/13/21 1500   sodium chloride Stopped (02/14/21 1329)   ceFEPime (MAXIPIME) IV 2 g (02/20/21 0548)   vancomycin 1,750 mg (02/19/21 1538)    PRN Medications: sodium chloride, sodium chloride, acetaminophen, docusate sodium, lip balm, sodium chloride flush    Assessment/Plan   Acute systolic HF -> Cardiogenic shock/acute biventricular heart failure/new cardiomyopathy -New diagnosis of CHF this admission after presenting with hemoptysis at OSH in [place]. Suspected viral illness with > 1 month hx weakness, fatigue, cough, N/V. HIV negative. Recently diagnosed hypothyroidism.  Etiology => viral myocarditis versus familial cardiomyopathy (grandmother and aunts/uncles with CHF).   -No CAD on LHC - Echo here with EF of 10-15%, moderate to severely reduced RV fxn with mildly dilated RV, RVSP 45 mmHg, severe secondary MR, mod-severe TR. - cMRI with LV EF 18%, RV EF 26%, LV  thrombus, Subtle non-coronary mid-wall LGE pattern in the inferior and anterolateral walls, possible prior myocarditis. - RHC with CI 2.48 on milrinone 0.25.  Now off milrinone and NE with co-ox low but stable at 59%. - CVP 4-5.  - Cut back torsemide to 20 mg daily. - Stop losartan. Start entresto 24/26 mg BID. - Continue dapagliflozin 10 daily.  - Continue spironolactone 25 mg daily.   - Continue digoxin, level 0.4 recently.  - Off ivabradine with PMVT.  - No beta blocker due to shock. - Overall picture concerning. 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  She is someone we may need to consider advanced therapies for, however RV dysfunction precludes VAD. BMI (35) is borderline for transplant - Cardiac rehab.   2. Mitral valve regurgitation - Severe on echo this admission, only looked mild on cMRI however.  - Likely secondary, valve does not look abnormal on  echo.   3. Moderate-severe TR - Likely secondary - Due to RV dilation (functional)  4. NSVT => episodes of PMVT - She had been on amiodarone gtt for NSVT, prolonged QTc and developed PMVT.  Now off amiodarone, Corlanor, and milrinone.  Transiently on lidocaine.   - Several episodes NSVT last few days, no recurrence after supplement K - Keep K > 4. - Keep Mg > 2.  - Has been fit for LifeVest  5. Hemoptysis/RLL pneumonia - Possible PE noted on imaging at OSH. Suspect course of events likely viral illness/myocarditis > inactivity, followed by PE and pneumonia. Venous duplex negative for DVT. - Suspect hemoptysis d/t elevated left atrial pressure, PNA and PE.  - Chest x-ray today d/t increased hemoptysis today - CT chest with consolidation both lower lobes. Ground glass opacities right middle lobe. - WBC remains elevated, 17-23K - With ongoing leukocytosis and dense right base consolidation on cMRI as well as green sputum and PCT 0.41, covering for HCAP with cefepime. MRSA nasal swab was negative, but GPCs noted on sputum culture => vancomycin added    6. RLL PE - Seen on imaging at [facility] (CTA chest).  - On apixaban now.   6. Hypothyroidism -Recently diagnosed. -TSH 13.5/Free T4 1.39. -On synthroid   7. Elevated INR - Likely due to passive congestion from CHF. - Given Vit K 6/29, 6/30, and 7/1 per CCM.  8. Hyponatremia - Resolved.  - Restrict free water  9. Acute blood loss anemia - Hgb 7.3 yesterday, transfused with 1 unit PRBCs - Hgb improved to 9.7 - Presented with massive hemoptysis which improved. Increase last 24 hrs, chest x-ray today.  10. 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  SRA pending - Currently on apixaban, plts now 275  11. LV thrombus - On apixaban    FINCH, LINDSAY N, PA-C  8:36 AM 02/20/2021  Patient seen and examined with the above-signed Advanced Practice Provider and/or Housestaff. I personally reviewed laboratory data, imaging studies and relevant notes. I independently examined the patient and formulated the important aspects of the plan. I have edited the note to reflect any of my changes or salient points. I have personally discussed the plan with the patient and/or family.  Coughed up some dark blood last night - not sure if old or new. No further VT/NSVT. Hgb improved after transfusion. HR remains fact. Breathing ok   General:  Sitting up in chair . No resp difficulty HEENT: normal Neck: supple. no JVD. Carotids 2+ bilat; no bruits. No lymphadenopathy or thryomegaly appreciated. Cor: PMI nondisplaced. Regular tachy No rubs, gallops or murmurs. Lungs: clear Abdomen: obese  soft, nontender, nondistended. No hepatosplenomegaly. No bruits or masses. Good bowel sounds. Extremities: no cyanosis, clubbing, rash, edema Neuro: alert & orientedx3, cranial nerves grossly intact. moves all 4 extremities w/o difficulty. Affect pleasant  Suspect she may have coughed up old blood last night. No further hemoptysis. CXR this afternoon reviewed personally. Looks good.   Volume status improved but still  tachy. Off ivabradine with recent torsades.   Agree with med adjustments as above.   Arvilla Meres, MD  3:55 PM

## 2021-02-20 NOTE — Progress Notes (Addendum)
CSW received a secure chat from cardiac rehab about the patient needing a scale. CSW brought Debra Daniel a scale as she reported not having one.  CSW again [employer] and [employer] about screening Debra Daniel for [facility] as her hospital billing account doesn't reflect any movement regarding Medicaid. Other than Debra Daniel maybe having Family Planning Medicaid which would not help the patient with much.  Jaleen Grupp, MSW, LCSWA [phone] Heart Failure Social Worker

## 2021-02-20 NOTE — Progress Notes (Signed)
CARDIAC REHAB PHASE I   PRE:  Rate/Rhythm: 120 ST    BP: sitting 102/70    SaO2: 97 RA  MODE:  Ambulation: 470 ft   POST:  Rate/Rhythm: 133 ST with PVCs    BP: sitting 115/79     SaO2: 97 RA  Pt just finished bathing. Able to stand and walk with standby assist. No c/o but I did note SOB after sitting. HR up to 133 ST walking, couplets PVCs. Pt does not complain. Able to st 2000 mg sodium diet. Encouraged more walking. Mother in room. [id number]  Harriet Masson CES, ACSM 02/20/2021 2:06 PM

## 2021-02-20 NOTE — Progress Notes (Signed)
PROGRESS NOTE    Debra Daniel  QIO:[id number] DOB: Mar 31, 1986 DOA: 02/06/2021 PCP: Patient, No Pcp Per (Inactive)    Brief Narrative:  Debra Daniel is a 35 year old female with past medical history significant for migraine headache, obesity recent diagnosis of hypothyroidism and recent nausea/vomiting with UTI symptoms treated with antibiotics outpatient who initially presented to [facility] on 6/27 with progressive lower extremity edema, hemoptysis.  Evaluation in outside facility ED with CTA chest and CT abdomen/pelvis notable for right-sided segmental pulmonary embolism.  She was initially admitted at [facility] started on heparin drip for pulmonary embolism which caused her hemoptysis become progressively worse; heparin was subsequently stopped.  TTE performed with LVEF 25-30% as well as moderate/severe mitral regurgitation and she was transferred to [facility] on 6/28 under PCCM service for further evaluation and care due to hypotension and hemoptysis.   Assessment & Plan:   Active Problems:   Hemoptysis   Acute systolic CHF (congestive heart failure) (HCC)   Pulmonary embolism (HCC)   Hypothyroidism   Tachycardia   Cardiogenic shock (HCC)   Acute blood loss anemia (ABLA)   Acute systolic congestive heart failure with cardiogenic shock Nonischemic cardiomyopathy with biventricular heart failure Patient presenting as a transfer from [facility] after being found with reduced LVEF of 25-30% with progressive lower extremity edema, TTE on 6/28 2022 with LVEF 10-15%, mildly dilated LV, moderate to severely reduced RV function with mildly dilated RV with RVSP 45 mmHg severe MR and moderate/severe TR.  Suspected etiology from her underlying hypothyroidism versus viral myocarditis versus familial cardiomyopathy.  Initially requiring vasopressors with norepinephrine and milrinone drip and Lasix drip which have been discontinued. --Cardiology/heart failure service  following, appreciate assistance --Net +311mL past 24h and negative 9.1L since admission --Digoxin 0.125 mg p.o. daily --Decreased Torsemide to 20 mg p.o. daily today --Losartan 25 mg p.o. daily --Spironolactone 25 mg p.o. daily --Dapagliflozin 10mg  PO daily --Strict I's and O's and daily weights  NSVT/torsades de points V. fib arrest Multifocal atrial tachycardia Patient's developed intraventricular fibrillation arrest requiring CPR for 4 minutes with defibrillation x2 on 02/13/2021 with ROSC achieved. 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  Was placed on bivalirudin drip for HIT (positive heparin-induced platelet antibodies, 2.313 on 7/6).  Lower extremity Doppler ultrasounds negative for DVT on 6/29 2022.  Transfuse 1 unit PRBC on 02/19/2021. --Bivalirudin drip transition to Eliquis 10 mg p.o. BID x 7d followed by 5mg  PO BID, hemoglobin stable, platelet level rising; 272 today --Serotonin release assay: Pending --Hemoglobin down to 9.0>7.3>9.7 today; goal hemoglobin >8.0 given cardiac history --Chest x-ray today for mild hemoptysis overnight --CBC daily  Hypothyroidism TSH 13.5 and FT4 1.39 --recently started Levothyroxine 04/22/2021 PO daily --will need repeat TFTs 4 weeks  Elevated LFTs: Resolved Elevated INR Etiology likely secondary to hepatic congestion from biventricular congestive heart failure. 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 --Hemoglobin stable, 9.3 this morning --Heparin discontinued, bilirubin drip transition to Eliquis on  7/8 --CBC daily  Community-acquired pneumonia CT chest 6/29 with areas of consolidation right lower lobe, left lower lobe and right middle lobe compatible with multifocal pneumonia.   --WBC 14.9>>19.6>>10.7>>17.2>19.6>20.8>23.4>19.0 --  procalcitonin 2.54>1.68>0.31>0.34  --Sputum culture 7/7 with normal flora, no staph/pseudomonas noted --Completes 5-day course of cefepime today, vancomycin for 1 additional day to complete 5-day course tomorrow --Monitor CBC daily  DVT prophylaxis: Place TED hose Start: 02/09/21 1516 SCDs Start: 02/07/21 0821 apixaban (ELIQUIS) tablet 10 mg  apixaban (ELIQUIS) tablet 5 mg    Code Status: Full Code Family Communication: No family present at bedside this morning  Disposition Plan:  Level of care: Telemetry Cardiac Status is: Inpatient  Remains inpatient appropriate because:Ongoing diagnostic testing needed not appropriate for outpatient work up, Unsafe d/c plan, IV treatments appropriate due to intensity of illness or inability to take PO, and Inpatient level of care appropriate due to severity of illness  Dispo: The patient is from: Home              Anticipated d/c is to: Home              Patient currently is not medically stable to d/c.   Difficult to place patient No   Consultants:  PCCM -signed off 7/9 Cardiology/heart failure service Electrophysiology - signed off 7/8  Procedures:  TTE 6/28: LVEF 10-15%, LV global hypokinesis, LV moderately dilated, RV systolic function severely reduced, LA mildly dilated, severe MR, severe TR, IVC normal in size Vascular duplex ultrasound bilateral lower extremities 6/29: Negative for DVT Right/left heart catheterization 7/5; no significant CAD, mildly elevated filling pressures, pulmonary venous hypertension.  Antimicrobials:  Vancomycin 6/28 - 6/29, 7/9 - 7/13 Cefepime 6/28 - 6/29, 7/8 - 7/12 Ceftriaxone 6/29 - 7/3 Doxycycline 6/29 - 7/1   Subjective: Patient seen examined bedside, resting  comfortably.  Sitting in bedside chair.  Few episodes of hemoptysis overnight.  Hemoglobin up to 9.7 this morning.  Was seen by heart failure service with decreased torsemide to 20 mg p.o. daily today.  Remains tachycardic on telemetry.  No other questions or concerns at this time.  Denies headache, no dizziness, no chest pain, palpitations, no shortness of breath, no abdominal pain.  No acute events overnight per nursing staff.  Objective: Vitals:   02/20/21 0338 02/20/21 0500 02/20/21 0739 02/20/21 0756  BP: 112/72  106/83 103/64  Pulse: (!) 102  (!) 117 (!) 115  Resp: 18  18 18   Temp: 98 F (36.7 C)  97.9 F (36.6 C) 97.9 F (36.6 C)  TempSrc: Oral  Oral Oral  SpO2: 96%  100% 100%  Weight:  92.4 kg    Height:        Intake/Output Summary (Last 24 hours) at 02/20/2021 1226 Last data filed at 02/20/2021 0800 Gross per 24 hour  Intake 2320.18 ml  Output 1200 ml  Net 1120.18 ml   Filed Weights   02/18/21 0500 02/19/21 0500 02/20/21 0500  Weight: 93.3 kg 91.6 kg 92.4 kg    Examination:  General exam: Appears calm and comfortable, obese Respiratory system: Clear to auscultation. Respiratory effort normal.  On room air Cardiovascular system: S1 & S2 heard, tachycardic, regular rhythm. No JVD, murmurs, rubs, gallops or clicks. 1-2+ pitting lower extremity edema to mid-shin Gastrointestinal system: Abdomen is nondistended, soft and nontender. No organomegaly or masses felt. Normal bowel sounds heard. Central nervous system: Alert and oriented. No focal neurological deficits. Extremities: Symmetric 5 x 5 power. Skin: No rashes, lesions or ulcers Psychiatry: Judgement and insight appear normal. Mood & affect appropriate.     Data Reviewed: I have personally reviewed following labs and imaging studies  CBC: Recent Labs  Lab 02/16/21 0422 02/17/21  0768 02/18/21 0511 02/19/21 0500 02/20/21 0440  WBC 17.2* 19.6* 20.8* 23.4* 19.0*  HGB 9.0* 9.3* 9.0* 7.3* 9.7*  HCT 29.0* 29.4*  29.0* 23.5* 30.0*  MCV 82.2 81.4 81.9 81.6 80.6  PLT 113* 154 197 272 275   Basic Metabolic Panel: Recent Labs  Lab 02/16/21 0422 02/17/21 0409 02/17/21 1100 02/17/21 2000 02/18/21 0511 02/19/21 0500 02/20/21 0440  NA 132* 134*  --   --  137 133* 138  K 4.0 3.7 3.7 4.1 4.2 3.6 4.2  CL 97* 97*  --   --  99 98 108  CO2 29 28  --   --  28 27 23   GLUCOSE 133* 99  --   --  97 97 89  BUN 17 17  --   --  19 28* 21*  CREATININE 0.88 0.88  --   --  0.90 0.98 0.81  CALCIUM 7.8* 7.9*  --   --  8.2* 7.5* 8.0*  MG 1.7 2.0  --   --  1.9 2.0 2.0   GFR: Estimated Creatinine Clearance: 109.9 mL/min (by C-G formula based on SCr of 0.81 mg/dL). Liver Function Tests: Recent Labs  Lab 02/13/21 2139 02/17/21 0409 02/18/21 0511 02/19/21 0500 02/20/21 0440  AST 58* 44* 56* 36 73*  ALT 41 39 45* 40 51*  ALKPHOS 64 60 66 61 68  BILITOT 2.0* 1.9* 2.0* 1.8* 2.2*  PROT 5.4* 4.8* 5.3* 5.2* 5.3*  ALBUMIN 1.9* 1.8* 1.9* 1.8* 1.8*   No results for input(s): LIPASE, AMYLASE in the last 168 hours. No results for input(s): AMMONIA in the last 168 hours. Coagulation Profile: Recent Labs  Lab 02/18/21 0511 02/19/21 0500 02/20/21 0440  INR 2.4* 2.7* 2.3*   Cardiac Enzymes: No results for input(s): CKTOTAL, CKMB, CKMBINDEX, TROPONINI in the last 168 hours. BNP (last 3 results) No results for input(s): PROBNP in the last 8760 hours. HbA1C: No results for input(s): HGBA1C in the last 72 hours. CBG: Recent Labs  Lab 02/19/21 1240 02/19/21 1640 02/19/21 2140 02/20/21 0736 02/20/21 1112  GLUCAP 82 78 88 131* 98   Lipid Profile: No results for input(s): CHOL, HDL, LDLCALC, TRIG, CHOLHDL, LDLDIRECT in the last 72 hours. Thyroid Function Tests: No results for input(s): TSH, T4TOTAL, FREET4, T3FREE, THYROIDAB in the last 72 hours. Anemia Panel: Recent Labs    02/19/21 0843  VITAMINB12 253  FOLATE 12.1  FERRITIN 605*  TIBC 234*  IRON 18*  RETICCTPCT 2.4   Sepsis Labs: Recent Labs   Lab 02/13/21 2143 02/14/21 0520 02/16/21 1235 02/17/21 0409 02/18/21 0511  PROCALCITON  --   --  0.31 0.31 0.34  LATICACIDVEN 2.7* 1.0  --   --   --     Recent Results (from the past 240 hour(s))  SARS Coronavirus 2 by RT PCR (hospital order, performed in [facility] hospital lab) Nasopharyngeal Nasopharyngeal Swab     Status: None   Collection Time: 02/13/21  3:23 AM   Specimen: Nasopharyngeal Swab  Result Value Ref Range Status   SARS Coronavirus 2 NEGATIVE NEGATIVE Final    Comment: (NOTE) SARS-CoV-2 target nucleic acids are NOT DETECTED.  The SARS-CoV-2 RNA is generally detectable in upper and lower respiratory specimens during the acute phase of infection. The lowest concentration of SARS-CoV-2 viral copies this assay can detect is 250 copies / mL. A negative result does not preclude SARS-CoV-2 infection and should not be used as the sole basis for treatment or other patient management decisions.  A negative  result may occur with improper specimen collection / handling, submission of specimen other than nasopharyngeal swab, presence of viral mutation(s) within the areas targeted by this assay, and inadequate number of viral copies (<250 copies / mL). A negative result must be combined with clinical observations, patient history, and epidemiological information.  Fact Sheet for Patients:   BoilerBrush.com.cy  Fact Sheet for Healthcare Providers: https://pope.com/  This test is not yet approved or  cleared by the [place] FDA and has been authorized for detection and/or diagnosis of SARS-CoV-2 by FDA under an Emergency Use Authorization (EUA).  This EUA will remain in effect (meaning this test can be used) for the duration of the COVID-19 declaration under Section 564(b)(1) of the Act, 21 U.S.C. section 360bbb-3(b)(1), unless the authorization is terminated or revoked sooner.  Performed at [facility],  [address], [place], [place] [address]   Expectorated Sputum Assessment w Gram Stain, Rflx to Resp Cult     Status: None   Collection Time: 02/15/21 11:49 AM   Specimen: Expectorated Sputum  Result Value Ref Range Status   Specimen Description EXPECTORATED SPUTUM  Final   Special Requests NONE  Final   Sputum evaluation   Final    THIS SPECIMEN IS ACCEPTABLE FOR SPUTUM CULTURE Performed at [facility], [address], [place], [place] [address]    Report Status 02/16/2021 FINAL  Final  Culture, Respiratory w Gram Stain     Status: None   Collection Time: 02/15/21 11:49 AM  Result Value Ref Range Status   Specimen Description EXPECTORATED SPUTUM  Final   Special Requests NONE Reflexed from [id number]  Final   Gram Stain   Final    ABUNDANT WBC PRESENT,BOTH PMN AND MONONUCLEAR RARE GRAM POSITIVE COCCI    Culture   Final    RARE Normal respiratory flora-no Staph aureus or Pseudomonas seen Performed at [facility], [address] [place], [place] [address]    Report Status 02/18/2021 FINAL  Final         Radiology Studies: No results found.      Scheduled Meds:  apixaban  10 mg Oral BID   Followed by   Melene Muller ON 02/23/2021] apixaban  5 mg Oral BID   Chlorhexidine Gluconate Cloth  6 each Topical Daily   dapagliflozin propanediol  10 mg Oral Daily   digoxin  0.125 mg Oral Daily   insulin aspart  0-5 Units Subcutaneous QHS   insulin aspart  0-9 Units Subcutaneous TID WC   levothyroxine  75 mcg Oral Q0600   mouth rinse  15 mL Mouth Rinse BID   [START ON 02/21/2021] sacubitril-valsartan  1 tablet Oral BID   sodium chloride flush  10-40 mL Intracatheter Q12H   sodium chloride flush  3 mL Intravenous Q12H   spironolactone  25 mg Oral Daily   [START ON 02/21/2021] torsemide  20 mg Oral Daily   Continuous Infusions:  sodium chloride 250 mL (02/15/21 0539)   sodium chloride 10 mL/hr at 02/13/21 1500   sodium chloride Stopped (02/14/21 1329)   ceFEPime (MAXIPIME)  IV 2 g (02/20/21 0548)   vancomycin 1,750 mg (02/19/21 1538)     LOS: 14 days    Time spent: 39 minutes spent on chart review, discussion with nursing staff, consultants, updating family and interview/physical exam; more than 50% of that time was spent in counseling and/or coordination of care.    Alvira Philips [place], DO [facility] Available via [employer]  secure chat 7am-7pm After these hours, please refer to coverage provider listed on [email or web address] 02/20/2021, 12:26 PM

## 2021-02-21 ENCOUNTER — Telehealth (HOSPITAL_COMMUNITY): Payer: Self-pay | Admitting: Pharmacy Technician

## 2021-02-21 ENCOUNTER — Other Ambulatory Visit (HOSPITAL_COMMUNITY): Payer: Self-pay

## 2021-02-21 LAB — BASIC METABOLIC PANEL
Anion gap: 8 (ref 5–15)
BUN: 23 mg/dL — ABNORMAL HIGH (ref 6–20)
CO2: 24 mmol/L (ref 22–32)
Calcium: 8.2 mg/dL — ABNORMAL LOW (ref 8.9–10.3)
Chloride: 107 mmol/L (ref 98–111)
Creatinine, Ser: 0.85 mg/dL (ref 0.44–1.00)
GFR, Estimated: >60 mL/min (ref >60)
Glucose, Bld: 95 mg/dL (ref 70–99)
Potassium: 3.7 mmol/L (ref 3.5–5.1)
Sodium: 139 mmol/L (ref 135–145)

## 2021-02-21 LAB — MAGNESIUM: Magnesium: 1.9 mg/dL (ref 1.7–2.4)

## 2021-02-21 LAB — GLUCOSE, CAPILLARY
Glucose-Capillary: 98 mg/dL (ref 70–99)
Glucose-Capillary: 101 mg/dL — ABNORMAL HIGH (ref 70–99)
Glucose-Capillary: 96 mg/dL (ref 70–99)
Glucose-Capillary: 99 mg/dL (ref 70–99)

## 2021-02-21 LAB — CBC
HCT: 30.8 % — ABNORMAL LOW (ref 36.0–46.0)
Hemoglobin: 9.7 g/dL — ABNORMAL LOW (ref 12.0–15.0)
MCH: 25.8 pg — ABNORMAL LOW (ref 26.0–34.0)
MCHC: 31.5 g/dL (ref 30.0–36.0)
MCV: 81.9 fL (ref 80.0–100.0)
Platelets: 294 10*3/uL (ref 150–400)
RBC: 3.76 MIL/uL — ABNORMAL LOW (ref 3.87–5.11)
RDW: 17.9 % — ABNORMAL HIGH (ref 11.5–15.5)
WBC: 18.4 10*3/uL — ABNORMAL HIGH (ref 4.0–10.5)
nRBC: 0 % (ref 0.0–0.2)

## 2021-02-21 LAB — COOXEMETRY PANEL
Carboxyhemoglobin: 1.7 % — ABNORMAL HIGH (ref 0.5–1.5)
Methemoglobin: 0.7 % (ref 0.0–1.5)
O2 Saturation: 65.6 %
Total hemoglobin: 10 g/dL — ABNORMAL LOW (ref 12.0–16.0)

## 2021-02-21 MED ORDER — MAGNESIUM OXIDE -MG SUPPLEMENT 400 (240 MG) MG PO TABS
400.0000 mg | ORAL_TABLET | Freq: Every day | ORAL | Status: DC
  Administered 2021-02-21: 400 mg via ORAL
  Filled 2021-02-21: qty 1

## 2021-02-21 MED ORDER — POTASSIUM CHLORIDE CRYS ER 20 MEQ PO TBCR
40.0000 meq | EXTENDED_RELEASE_TABLET | Freq: Once | ORAL | Status: AC
  Administered 2021-02-21: 40 meq via ORAL
  Filled 2021-02-21: qty 2

## 2021-02-21 MED ORDER — CARVEDILOL 3.125 MG PO TABS
3.1250 mg | ORAL_TABLET | Freq: Two times a day (BID) | ORAL | Status: DC
  Administered 2021-02-21 – 2021-02-22 (×2): 3.125 mg via ORAL
  Filled 2021-02-21 (×2): qty 1

## 2021-02-21 NOTE — Progress Notes (Signed)
CARDIAC REHAB PHASE I   PRE:  Rate/Rhythm: 116 ST with PVCs    BP: sitting 95/62    SaO2: 98 RA  MODE:  Ambulation: 470 ft   POST:  Rate/Rhythm: 133 ST with PVCs    BP: sitting 103/75     SaO2: 100 RA  Pt eager to ambulate. Quicker pace, HR elevated.  Return to recliner. C/o feet being swollen. Can perform teach back well regarding low sodium and daily wts. Will discuss exercise tomorrow. [id number]  Harriet Masson CES, ACSM 02/21/2021 2:56 PM

## 2021-02-21 NOTE — Progress Notes (Addendum)
Patient ID: Debra Daniel, female   DOB: 1985-11-27, 35 y.o.   MRN: [id number]     Advanced Heart Failure Rounding Note  PCP-Cardiologist: Dr. Teressa Lower  Subjective:   -05/13: Seen in ED for recurrent N/V. Treated for possible UTI. Ongoing weakness, fatigue, intermittent N/V, cough. -06/27: ED at OSH with hemoptysis. Segmental PE and pneumonia. EF 25-30%. -06/28: Transferred to [facility]. Cardiogenic shock > required norepi and milrinone. Diuresed with IV lasix. -06/29: Tachycardic. Added digoxin and spiro. Amio added d/t NSVT.  -6/30: Midodrine started and lasix drip started.  -7/5: RHC/LHC No CAD   -7/5 Developed PMVT and required defibrillation x 2 with brief CPR.  QTc prolonged, amiodarone was stopped.  She received Mg and K. Milrinone decreased to 0.125.  She had another episode PMVT, Corlanor stopped. Now off lidocaine -07/08: Milrinone D/Cd  Fitted for LifeVest.   Co-ox 66%.   Wt down 4 lb. CVP 4-5   Feels ok today. Only coughed up blood x 1 today, small amount in napkin. Hgb stable at 9.7.   Remains tachycardic, sinus. HR 110s. 6 beat run of NSVT.   Cardiac MRI: 1.  Moderately dilated LV with EF 18%, diffuse hypokinesis. 2.  LV thrombus noted. 3.  Moderately dilated RV with EF 26%. 4.  MR looks mild by cMRI, regurgitant fraction 15%. 5. Subtle non-coronary mid-wall LGE pattern in the inferior and anterolateral walls, possible prior myocarditis. 6.  Consolidation of the right lung base.  Echo 02/06/2021: LVEF 10-15%, RV moderately enlarged with severely reduced systolic function, RVSP 45.9 mmHg, severe secondary MR, mod-severe TR.    Objective:   Weight Range: 90.3 kg Body mass index is 33.65 kg/m.   Vital Signs:   Temp:  [97.8 F (36.6 C)-98.5 F (36.9 C)] 98.1 F (36.7 C) (07/13 0318) Pulse Rate:  [107-119] 110 (07/13 0318) Resp:  [18] 18 (07/13 0318) BP: (97-119)/(62-82) 119/82 (07/13 0318) SpO2:  [97 %-100 %] 100 % (07/13 0318) Weight:  [90.3 kg] 90.3 kg  (07/13 0318) Last BM Date: 02/20/21  Weight change: Filed Weights   02/19/21 0500 02/20/21 0500 02/21/21 0318  Weight: 91.6 kg 92.4 kg 90.3 kg    Intake/Output:   Intake/Output Summary (Last 24 hours) at 02/21/2021 1218 Last data filed at 02/21/2021 0829 Gross per 24 hour  Intake 955.99 ml  Output 550 ml  Net 405.99 ml      Physical Exam   CVP 4-5  General:  Well appearing obese young WF. Sitting up in chair.  No respiratory difficulty HEENT: normal Neck: supple.  JVD not elevated. Carotids 2+ bilat; no bruits. No lymphadenopathy or thyromegaly appreciated. Cor: PMI nondisplaced. Regular rhythm, tachy rate. No rubs, gallops or murmurs. Lungs: decreased BS at the bases bilaterally  Abdomen: obese, soft, nontender, nondistended. No hepatosplenomegaly. No bruits or masses. Good bowel sounds. Extremities: no cyanosis, clubbing, rash, 1-2+ bilateral LE edema L>R + TED hoses  Neuro: alert & oriented x 3, cranial nerves grossly intact. moves all 4 extremities w/o difficulty. Affect pleasant.   Telemetry   Sinus tachy 110s, 1 6 beat run of NSVT   Labs    CBC Recent Labs    02/20/21 0440 02/21/21 0605  WBC 19.0* 18.4*  HGB 9.7* 9.7*  HCT 30.0* 30.8*  MCV 80.6 81.9  PLT 275 294   Basic Metabolic Panel Recent Labs    44/96/75 0440 02/21/21 0605  NA 138 139  K 4.2 3.7  CL 108 107  CO2 23 24  GLUCOSE 89 95  BUN 21* 23*  CREATININE 0.81 0.85  CALCIUM 8.0* 8.2*  MG 2.0 1.9   Liver Function Tests Recent Labs    02/19/21 0500 02/20/21 0440  AST 36 73*  ALT 40 51*  ALKPHOS 61 68  BILITOT 1.8* 2.2*  PROT 5.2* 5.3*  ALBUMIN 1.8* 1.8*    No results for input(s): LIPASE, AMYLASE in the last 72 hours. Cardiac Enzymes No results for input(s): CKTOTAL, CKMB, CKMBINDEX, TROPONINI in the last 72 hours.  BNP: BNP (last 3 results) Recent Labs    02/06/21 2018  BNP 1,571.8*    ProBNP (last 3 results) No results for input(s): PROBNP in the last 8760  hours.   D-Dimer No results for input(s): DDIMER in the last 72 hours.  Hemoglobin A1C No results for input(s): HGBA1C in the last 72 hours.  Fasting Lipid Panel No results for input(s): CHOL, HDL, LDLCALC, TRIG, CHOLHDL, LDLDIRECT in the last 72 hours. Thyroid Function Tests No results for input(s): TSH, T4TOTAL, T3FREE, THYROIDAB in the last 72 hours.  Invalid input(s): FREET3   Other results:   Imaging    DG Chest 2 View  Result Date: 02/20/2021 CLINICAL DATA:  Hemoptysis EXAM: CHEST - 2 VIEW COMPARISON:  02/16/2021 FINDINGS: Cardiac shadow is within normal limits. Right-sided PICC line is noted at the cavoatrial junction. Mild bibasilar airspace opacity is noted right greater than left. No sizable effusion is seen. No bony abnormality is noted. IMPRESSION: PICC line as described. Bibasilar infiltrates right greater than left without focal effusion. Electronically Signed   By: Alcide Clever M.D.   On: 02/20/2021 17:27     Medications:     Scheduled Medications:  apixaban  10 mg Oral BID   Followed by   Melene Muller ON 02/23/2021] apixaban  5 mg Oral BID   Chlorhexidine Gluconate Cloth  6 each Topical Daily   dapagliflozin propanediol  10 mg Oral Daily   digoxin  0.125 mg Oral Daily   insulin aspart  0-5 Units Subcutaneous QHS   insulin aspart  0-9 Units Subcutaneous TID WC   levothyroxine  75 mcg Oral Q0600   mouth rinse  15 mL Mouth Rinse BID   sacubitril-valsartan  1 tablet Oral BID   sodium chloride flush  10-40 mL Intracatheter Q12H   sodium chloride flush  3 mL Intravenous Q12H   spironolactone  25 mg Oral Daily   torsemide  20 mg Oral Daily    Infusions:  sodium chloride 250 mL (02/15/21 0539)   sodium chloride 10 mL/hr at 02/13/21 1500   sodium chloride Stopped (02/14/21 1329)   vancomycin Stopped (02/20/21 1916)    PRN Medications: sodium chloride, sodium chloride, acetaminophen, docusate sodium, lip balm, sodium chloride flush    Assessment/Plan    Acute systolic HF -> Cardiogenic shock/acute biventricular heart failure/new cardiomyopathy -New diagnosis of CHF this admission after presenting with hemoptysis at OSH in [place]. Suspected viral illness with > 1 month hx weakness, fatigue, cough, N/V. HIV negative. Recently diagnosed hypothyroidism.  Etiology => viral myocarditis versus familial cardiomyopathy (grandmother and aunts/uncles with CHF).   -No CAD on LHC - Echo here with EF of 10-15%, moderate to severely reduced RV fxn with mildly dilated RV, RVSP 45 mmHg, severe secondary MR, mod-severe TR. - cMRI with LV EF 18%, RV EF 26%, LV thrombus, Subtle non-coronary mid-wall LGE pattern in the inferior and anterolateral walls, possible prior myocarditis. - RHC with CI 2.48 on milrinone 0.25.  Now off milrinone and NE. Co-ox stable at  66%. - CVP 4-5.  - C/w torsemide 20 mg daily. - C/w Entresto 24/26 mg BID. - Continue dapagliflozin 10 daily.  - Continue spironolactone 25 mg daily.   - Continue digoxin, level 0.4 recently.  - Off ivabradine with PMVT.  - No beta blocker due to shock. - Overall picture concerning. 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  She is someone we may need to consider advanced therapies for, however RV dysfunction precludes VAD. BMI (35) is borderline for transplant - Cardiac rehab.   2. Mitral valve regurgitation - Severe on echo this admission, only looked mild on cMRI however.  - Likely secondary, valve does not look abnormal on echo.   3. Moderate-severe TR - Likely secondary - Due to RV dilation (functional)  4. NSVT => episodes of PMVT - She had been on amiodarone gtt for NSVT, prolonged QTc and developed PMVT.  Now off amiodarone, Corlanor, and milrinone.  Transiently on lidocaine.   - Several episodes NSVT last few days, no recurrence after supplement K - Keep K > 4. - Keep Mg > 2.  - Has been fit for LifeVest  5. Hemoptysis/RLL pneumonia - Possible PE noted on imaging at OSH. Suspect course of events likely  viral illness/myocarditis > inactivity, followed by PE and pneumonia. Venous duplex negative for DVT. - Suspect hemoptysis d/t elevated left atrial pressure, PNA and PE.  - Chest x-ray today d/t increased hemoptysis today - CT chest with consolidation both lower lobes. Ground glass opacities right middle lobe. - WBC remains elevated but improving, 23>>19>>18K - With ongoing leukocytosis and dense right base consolidation on cMRI as well as green sputum and PCT 0.41, covering for HCAP with cefepime. MRSA nasal swab was negative, but GPCs noted on sputum culture => vancomycin added   6. RLL PE - Se[facility] on [facility] at [facility] ([facility]A chest).  - On apixaban now.   6. Hypothyroidism -Recently diagnosed. -TSH 13.5/Free T4 1.39. -On synthroid   7. Elevated INR - Likely due to passive congestion from CHF. - Given Vit K 6/29, 6/30, and 7/1 per CCM.  8. Hyponatremia - Resolved.  - Restrict free water  9. Acute blood loss anemia - Presented with massive hemoptysis which improved.  - required transfusion  - Hgb improved and stable at 9.7  10. 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 LV thrombus - On apixaban   Robbie Lis, PA-C  12:18 PM 02/21/2021  Patient seen and examined with the above-signed Advanced Practice Provider and/or Housestaff. I personally reviewed laboratory data, imaging studies and relevant notes. I independently examined the patient and formulated the important aspects of the plan. I have edited the note to reflect any of my changes or salient points. I have personally discussed the plan with the patient and/or family.  Had small amount of dark hemoptysis. Otherwise feels ok. Remains tachy. No further VT. Co-ox 66%  General:  Well appearing. No resp difficulty HEENT: normal Neck: supple. no JVD. Carotids 2+ bilat; no bruits. No lymphadenopathy or thryomegaly appreciated. Cor: PMI nondisplaced. Tachy regular Lungs:  clear Abdomen: obese soft, nontender, nondistended. No hepatosplenomegaly. No bruits or masses. Good bowel sounds. Extremities: no cyanosis, clubbing, rash, edema Neuro: alert & orientedx3, cranial nerves grossly intact. moves all 4 extremities w/o difficulty. Affect pleasant  Improving but still fairly tachycardic. Will start low-dose b-blocker. If tolerates can go home in am with LifeVest.   Arvilla Meres, MD  1:14 PM

## 2021-02-21 NOTE — Telephone Encounter (Signed)
Advanced Heart Failure Patient Advocate Encounter  Patient is currently inpatient and will be followed by [facility] upon discharge. The patient is currently uninsured and is being started on [place] and Eliquis.  Started an application for [employer], [employer] and [employer]. Will fax in once signatures are obtained.

## 2021-02-21 NOTE — Progress Notes (Signed)
Mobility Specialist: Progress Note   02/21/21 1743  Mobility  Activity Ambulated in hall  Level of Assistance Independent  Assistive Device None  Distance Ambulated (ft) 470 ft  Mobility Ambulated independently in hallway  Mobility Response Tolerated well  Mobility performed by Mobility specialist  Bed Position Chair  $Mobility charge 1 Mobility   Pre-Mobility: 122 HR During Mobility: 136 HR Post-Mobility: 114 HR  Pt asx during ambulation. Pt back to recliner after walk with family present in the room.   [facility] Jaquavian Firkus Mobility Specialist Mobility Specialist Phone: [phone]

## 2021-02-21 NOTE — Progress Notes (Addendum)
PROGRESS NOTE    Debra Daniel  XFG:[id number] DOB: 10-08-1985 DOA: 02/06/2021 PCP: Patient, No Pcp Per (Inactive)  No chief complaint on file.  Brief Narrative:  Debra Daniel is Debra Daniel 35 year old female with past medical history significant for migraine headache, obesity recent diagnosis of hypothyroidism and recent nausea/vomiting with UTI symptoms treated with antibiotics outpatient who initially presented to [facility] on 6/27 with progressive lower extremity edema, hemoptysis.  Evaluation in outside facility ED with CTA chest and CT abdomen/pelvis notable for right-sided segmental pulmonary embolism.  She was initially admitted at [facility] started on heparin drip for pulmonary embolism which caused her hemoptysis become progressively worse; heparin was subsequently stopped.  TTE performed with LVEF 25-30% as well as moderate/severe mitral regurgitation and she was transferred to [facility] on 6/28 under PCCM service for further evaluation and care due to hypotension and hemoptysis.   Assessment & Plan:   Active Problems:   Hemoptysis   Acute systolic CHF (congestive heart failure) (HCC)   Pulmonary embolism (HCC)   Hypothyroidism   Tachycardia   Cardiogenic shock (HCC)   Acute blood loss anemia (ABLA)  Acute systolic congestive heart failure with cardiogenic shock Nonischemic cardiomyopathy with biventricular heart failure Patient presenting as Debra Daniel transfer from [facility] after being found with reduced LVEF of 25-30% with progressive lower extremity edema, TTE on 6/28 2022 with LVEF 10-15%, mildly dilated LV, moderate to severely reduced RV function with mildly dilated RV with RVSP 45 mmHg severe MR and moderate/severe TR.  Suspected etiology from her underlying hypothyroidism versus viral myocarditis versus familial cardiomyopathy.  Initially requiring vasopressors with norepinephrine and milrinone drip and Lasix drip which have been  discontinued. --Cardiology/heart failure service following, appreciate assistance --Net negative 6.6 L since admission --Digoxin 0.125 mg p.o. daily --started on coreg 3.125 mg  --entresto 24-26 mg BID --Decreased Torsemide to 20 mg p.o. daily today --Spironolactone 25 mg p.o. daily --Dapagliflozin 10mg  PO daily --Strict I's and O's and daily weights   NSVT/torsades de points V. fib arrest Multifocal atrial tachycardia Patient's developed intraventricular fibrillation arrest requiring CPR for 4 minutes with defibrillation x2 on 02/13/2021 with ROSC achieved. --Cardiology following as above --Continue to monitor electrolytes daily, K goal>4 and Mag goal >2 --Avoid QTC prolonging medications --Monitor on telemetry --TOC consulted for LifeVest on discharge; will need repeat TTE 1 month   Hemoptysis Right lower lobe pulmonary embolism HIT Patient initially presented to outside facility with mild hemoptysis, worsens while on heparin drip which was discontinued.  Was placed on bivalirudin drip for HIT (positive heparin-induced platelet antibodies, 2.313 on 7/6).  Lower extremity Doppler ultrasounds negative for DVT on 6/29 2022.  Transfuse 1 unit PRBC on 02/19/2021. --Bivalirudin drip transition to Eliquis 10 mg p.o. BID x 7d followed by 5mg  PO BID, hemoglobin stable, platelet level improved  --Serotonin release assay: positive --Hb stable, s/p 1 unit pRBC --Chest x-ray with bibasilar infiltrates R>L without focal effusion - hemoptysis seems to be lessening per discussion? Old blood?  Follow closely. --CBC daily -- heparin added to allergy list   LV Thrombus -- currently on eliquis  Hypothyroidism TSH 13.5 and FT4 1.39 --recently started Levothyroxine 04/22/2021 PO daily --will need repeat TFTs 4 weeks   Elevated LFTs: Resolved Elevated INR Etiology likely secondary to hepatic congestion from biventricular congestive heart failure. --CMP/INR daily   Acute blood loss  anemia Thrombocytopenia Etiology likely secondary to hemoptysis combined with HIT. --Hb stable, platelets wnl  --follow on eliquis   Community-acquired pneumonia  CT chest 6/29 with areas of consolidation right lower lobe, left lower lobe and right middle lobe compatible with multifocal pneumonia.   --WBC 14.9>>19.6>>10.7>>17.2>19.6>20.8>23.4>19.0 --procalcitonin 2.54>1.68>0.31>0.34 --Sputum culture 7/7 with normal flora, no staph/pseudomonas noted --s/p 5 days vanc/cefepime --Monitor CBC daily   DVT prophylaxis: eliquis Code Status:full Family Communication: parents at bedside Disposition:   Status is: Inpatient  Remains inpatient appropriate because:Inpatient level of care appropriate due to severity of illness  Dispo:  Patient From: Home  Planned Disposition: Home  Medically stable for discharge: No         Consultants:  Cardiology PCCM EP  Procedures: Cath 1. Mildly elevated filling pressures. 2. Pulmonary venous hypertension. 3. Cardiac output ok on milrinone 0.25 4. No significant coronary disease.   Nonischemic cardiomyopathy.  LE US Summary:  BILATERAL:  - No evidence of deep vein thrombosis seen in the lower extremities,  bilaterally.  -No evidence of popliteal cyst, bilaterally.     Echo IMPRESSIONS     1. Left ventricular ejection fraction, by estimation, is 10-15%. The left  ventricle has severely decreased function. The left ventricle demonstrates  global hypokinesis. The left ventricular internal cavity size was  moderately dilated. Left ventricular  diastolic function could not be evaluated.   2. Right ventricular systolic function is moderate-severely reduced. The  right ventricular size is moderately enlarged. There is moderately  elevated pulmonary artery systolic pressure. The estimated right  ventricular systolic pressure is 45.9 mmHg.   3. Left atrial size was mildly dilated.   4. The mitral valve is grossly normal. Severe,  secondary mitral valve  regurgitation with leaflet tenting secondary to LV dysfunction. No  evidence of mitral stenosis.   5. Tricuspid valve regurgitation is severe.   6. The aortic valve is normal in structure. Aortic valve regurgitation is  not visualized.   7. The inferior vena cava is normal in size with <50% respiratory  variability, suggesting right atrial pressure of 8 mmHg.    Antimicrobials:  Anti-infectives (From admission, onward)    Start     Dose/Rate Route Frequency Ordered Stop   02/18/21 1000  vancomycin (VANCOREADY) IVPB 1750 mg/350 mL        1,750 mg 175 mL/hr over 120 Minutes Intravenous Every 24 hours 02/17/21 1127 02/21/21 2359   02/17/21 1215  vancomycin (VANCOREADY) IVPB 2000 mg/400 mL        2,000 mg 200 mL/hr over 120 Minutes Intravenous  Once 02/17/21 1127 02/17/21 1428   02/16/21 0830  ceFEPIme (MAXIPIME) 2 g in sodium chloride 0.9 % 100 mL IVPB        2 g 200 mL/hr over 30 Minutes Intravenous Every 8 hours 02/16/21 0730 02/21/21 0700   02/10/21 1000  cefTRIAXone (ROCEPHIN) 2 g in sodium chloride 0.9 % 100 mL IVPB        2 g 200 mL/hr over 30 Minutes Intravenous Every 24 hours 02/10/21 0826 02/11/21 1104   02/07/21 2200  doxycycline (VIBRAMYCIN) 100 mg in sodium chloride 0.9 % 250 mL IVPB        100 mg 125 mL/hr over 120 Minutes Intravenous Every 12 hours 02/07/21 2113 02/09/21 2310   02/07/21 1200  vancomycin (VANCOREADY) IVPB 1500 mg/300 mL  Status:  Discontinued        1,500 mg 150 mL/hr over 120 Minutes Intravenous Every 12 hours 02/06/21 2305 02/07/21 0833   02/07/21 1000  doxycycline (VIBRA-TABS) tablet 100 mg  Status:  Discontinued        100 mg  Oral Every 12 hours 02/07/21 0833 02/07/21 2113   02/07/21 0930  cefTRIAXone (ROCEPHIN) 1 g in sodium chloride 0.9 % 100 mL IVPB  Status:  Discontinued        1 g 200 mL/hr over 30 Minutes Intravenous Every 24 hours 02/07/21 0833 02/10/21 0826   02/06/21 2330  ceFEPIme (MAXIPIME) 2 g in sodium chloride  0.9 % 100 mL IVPB  Status:  Discontinued        2 g 200 mL/hr over 30 Minutes Intravenous Every 8 hours 02/06/21 2305 02/07/21 0833   02/06/21 2315  vancomycin (VANCOREADY) IVPB 2000 mg/400 mL        2,000 mg 200 mL/hr over 120 Minutes Intravenous NOW 02/06/21 2305 02/07/21 0409          Subjective: No new complaints  Objective: Vitals:   02/20/21 2013 02/21/21 0057 02/21/21 0318 02/21/21 1420  BP: 99/68 101/65 119/82 96/72  Pulse: (!) 115 (!) 107 (!) 110 82  Resp:  18 18 18   Temp: 98.5 F (36.9 C) 98.2 F (36.8 C) 98.1 F (36.7 C) 98.2 F (36.8 C)  TempSrc: Oral Oral Oral Oral  SpO2: 100% 100% 100%   Weight:   90.3 kg   Height:        Intake/Output Summary (Last 24 hours) at 02/21/2021 1643 Last data filed at 02/21/2021 0829 Gross per 24 hour  Intake 690 ml  Output 550 ml  Net 140 ml   Filed Weights   02/19/21 0500 02/20/21 0500 02/21/21 0318  Weight: 91.6 kg 92.4 kg 90.3 kg    Examination:  General exam: Appears calm and comfortable  Respiratory system: Clear to auscultation. Respiratory effort normal. Cardiovascular system: S1 & S2 heard, RRR. Gastrointestinal system: Abdomen is nondistended, soft and nontender. Central nervous system: Alert and oriented. No focal neurological deficits. Extremities: bilateral LE edema, compressions stockings Skin: No rashes, lesions or ulcers Psychiatry: Judgement and insight appear normal. Mood & affect appropriate.     Data Reviewed: I have personally reviewed following labs and imaging studies  CBC: Recent Labs  Lab 02/17/21 0409 02/18/21 0511 02/19/21 0500 02/20/21 0440 02/21/21 0605  WBC 19.6* 20.8* 23.4* 19.0* 18.4*  HGB 9.3* 9.0* 7.3* 9.7* 9.7*  HCT 29.4* 29.0* 23.5* 30.0* 30.8*  MCV 81.4 81.9 81.6 80.6 81.9  PLT 154 197 272 275 294    Basic Metabolic Panel: Recent Labs  Lab 02/17/21 0409 02/17/21 1100 02/17/21 2000 02/18/21 0511 02/19/21 0500 02/20/21 0440 02/21/21 0605  NA 134*  --   --   137 133* 138 139  K 3.7   < > 4.1 4.2 3.6 4.2 3.7  CL 97*  --   --  99 98 108 107  CO2 28  --   --  28 27 23 24   GLUCOSE 99  --   --  97 97 89 95  BUN 17  --   --  19 28* 21* 23*  CREATININE 0.88  --   --  0.90 0.98 0.81 0.85  CALCIUM 7.9*  --   --  8.2* 7.5* 8.0* 8.2*  MG 2.0  --   --  1.9 2.0 2.0 1.9   < > = values in this interval not displayed.    GFR: Estimated Creatinine Clearance: 103.6 mL/min (by C-G formula based on SCr of 0.85 mg/dL).  Liver Function Tests: Recent Labs  Lab 02/17/21 0409 02/18/21 0511 02/19/21 0500 02/20/21 0440  AST 44* 56* 36 73*  ALT 39 45* 40 51*  ALKPHOS 60 66 61 68  BILITOT 1.9* 2.0* 1.8* 2.2*  PROT 4.8* 5.3* 5.2* 5.3*  ALBUMIN 1.8* 1.9* 1.8* 1.8*    CBG: Recent Labs  Lab 02/20/21 1613 02/20/21 2131 02/21/21 0733 02/21/21 1129 02/21/21 1548  GLUCAP 92 110* 98 101* 96     Recent Results (from the past 240 hour(s))  SARS Coronavirus 2 by RT PCR (hospital order, performed in [facility] hospital lab) Nasopharyngeal Nasopharyngeal Swab     Status: None   Collection Time: 02/13/21  3:23 AM   Specimen: Nasopharyngeal Swab  Result Value Ref Range Status   SARS Coronavirus 2 NEGATIVE NEGATIVE Final    Comment: (NOTE) SARS-CoV-2 target nucleic acids are NOT DETECTED.  The SARS-CoV-2 RNA is generally detectable in upper and lower respiratory specimens during the acute phase of infection. The lowest concentration of SARS-CoV-2 viral copies this assay can detect is 250 copies / mL. Debra Daniel negative result does not preclude SARS-CoV-2 infection and should not be used as the sole basis for treatment or other patient management decisions.  Debra Daniel negative result may occur with improper specimen collection / handling, submission of specimen other than nasopharyngeal swab, presence of viral mutation(s) within the areas targeted by this assay, and inadequate number of viral copies (<250 copies / mL). Debra Daniel negative result must be combined with  clinical observations, patient history, and epidemiological information.  Fact Sheet for Patients:   BoilerBrush.com.cy  Fact Sheet for Healthcare Providers: https://pope.com/  This test is not yet approved or  cleared by the [place] FDA and has been authorized for detection and/or diagnosis of SARS-CoV-2 by FDA under an Emergency Use Authorization (EUA).  This EUA will remain in effect (meaning this test can be used) for the duration of the COVID-19 declaration under Section 564(b)(1) of the Act, 21 U.S.C. section 360bbb-3(b)(1), unless the authorization is terminated or revoked sooner.  Performed at [facility], [address] [place], [place] [address]   Expectorated Sputum Assessment w Gram Stain, Rflx to Resp Cult     Status: None   Collection Time: 02/15/21 11:49 AM   Specimen: Expectorated Sputum  Result Value Ref Range Status   Specimen Description EXPECTORATED SPUTUM  Final   Special Requests NONE  Final   Sputum evaluation   Final    THIS SPECIMEN IS ACCEPTABLE FOR SPUTUM CULTURE Performed at [facility], [address], [place], [place] [address]    Report Status 02/16/2021 FINAL  Final  Culture, Respiratory w Gram Stain     Status: None   Collection Time: 02/15/21 11:49 AM  Result Value Ref Range Status   Specimen Description EXPECTORATED SPUTUM  Final   Special Requests NONE Reflexed from [id number]  Final   Gram Stain   Final    ABUNDANT WBC PRESENT,BOTH PMN AND MONONUCLEAR RARE GRAM POSITIVE COCCI    Culture   Final    RARE Normal respiratory flora-no Staph aureus or Pseudomonas seen Performed at [facility], [address], [place], [place] [address]    Report Status 02/18/2021 FINAL  Final         Radiology Studies: DG Chest 2 View  Result Date: 02/20/2021 CLINICAL DATA:  Hemoptysis EXAM: CHEST - 2 VIEW COMPARISON:  02/16/2021 FINDINGS: Cardiac shadow is within normal limits.  Right-sided PICC line is noted at the cavoatrial junction. Mild bibasilar airspace opacity is noted right greater than left. No sizable effusion is seen. No bony abnormality is noted. IMPRESSION: PICC line  as described. Bibasilar infiltrates right greater than left without focal effusion. Electronically Signed   By: Alcide Clever M.D.   On: 02/20/2021 17:27        Scheduled Meds:  apixaban  10 mg Oral BID   Followed by   Melene Muller ON 02/23/2021] apixaban  5 mg Oral BID   carvedilol  3.125 mg Oral BID WC   Chlorhexidine Gluconate Cloth  6 each Topical Daily   dapagliflozin propanediol  10 mg Oral Daily   digoxin  0.125 mg Oral Daily   insulin aspart  0-5 Units Subcutaneous QHS   insulin aspart  0-9 Units Subcutaneous TID WC   levothyroxine  75 mcg Oral Q0600   mouth rinse  15 mL Mouth Rinse BID   sacubitril-valsartan  1 tablet Oral BID   sodium chloride flush  10-40 mL Intracatheter Q12H   sodium chloride flush  3 mL Intravenous Q12H   spironolactone  25 mg Oral Daily   torsemide  20 mg Oral Daily   Continuous Infusions:  sodium chloride 250 mL (02/15/21 0539)   sodium chloride 10 mL/hr at 02/13/21 1500   sodium chloride Stopped (02/14/21 1329)   vancomycin 1,750 mg (02/21/21 1524)     LOS: 15 days    Time spent: over 30 min    Lacretia Nicks, MD [facility]   To contact the attending provider between 7A-7P or the covering provider during after hours 7P-7A, please log into the web site [email or web address] and access using universal  password for that web site. If you do not have the password, please call the hospital operator.  02/21/2021, 4:43 PM

## 2021-02-22 ENCOUNTER — Other Ambulatory Visit (HOSPITAL_COMMUNITY): Payer: Self-pay

## 2021-02-22 LAB — COOXEMETRY PANEL
Carboxyhemoglobin: 1.6 % — ABNORMAL HIGH (ref 0.5–1.5)
Methemoglobin: 0.7 % (ref 0.0–1.5)
O2 Saturation: 70.1 %
Total hemoglobin: 9.7 g/dL — ABNORMAL LOW (ref 12.0–16.0)

## 2021-02-22 LAB — COMPREHENSIVE METABOLIC PANEL
ALT: 75 U/L — ABNORMAL HIGH (ref 0–44)
AST: 71 U/L — ABNORMAL HIGH (ref 15–41)
Albumin: 1.9 g/dL — ABNORMAL LOW (ref 3.5–5.0)
Alkaline Phosphatase: 81 U/L (ref 38–126)
Anion gap: 7 (ref 5–15)
BUN: 20 mg/dL (ref 6–20)
CO2: 24 mmol/L (ref 22–32)
Calcium: 8 mg/dL — ABNORMAL LOW (ref 8.9–10.3)
Chloride: 107 mmol/L (ref 98–111)
Creatinine, Ser: 0.74 mg/dL (ref 0.44–1.00)
GFR, Estimated: >60 mL/min (ref >60)
Glucose, Bld: 95 mg/dL (ref 70–99)
Potassium: 4 mmol/L (ref 3.5–5.1)
Sodium: 138 mmol/L (ref 135–145)
Total Bilirubin: 1.6 mg/dL — ABNORMAL HIGH (ref 0.3–1.2)
Total Protein: 5.3 g/dL — ABNORMAL LOW (ref 6.5–8.1)

## 2021-02-22 LAB — GLUCOSE, CAPILLARY
Glucose-Capillary: 102 mg/dL — ABNORMAL HIGH (ref 70–99)
Glucose-Capillary: 97 mg/dL (ref 70–99)

## 2021-02-22 LAB — CBC
HCT: 30.3 % — ABNORMAL LOW (ref 36.0–46.0)
Hemoglobin: 9.5 g/dL — ABNORMAL LOW (ref 12.0–15.0)
MCH: 26 pg (ref 26.0–34.0)
MCHC: 31.4 g/dL (ref 30.0–36.0)
MCV: 83 fL (ref 80.0–100.0)
Platelets: 311 10*3/uL (ref 150–400)
RBC: 3.65 MIL/uL — ABNORMAL LOW (ref 3.87–5.11)
RDW: 18.3 % — ABNORMAL HIGH (ref 11.5–15.5)
WBC: 17.6 10*3/uL — ABNORMAL HIGH (ref 4.0–10.5)
nRBC: 0 % (ref 0.0–0.2)

## 2021-02-22 LAB — PHOSPHORUS: Phosphorus: 3.3 mg/dL (ref 2.5–4.6)

## 2021-02-22 LAB — MAGNESIUM: Magnesium: 1.9 mg/dL (ref 1.7–2.4)

## 2021-02-22 MED ORDER — DIGOXIN 125 MCG PO TABS
0.1250 mg | ORAL_TABLET | Freq: Every day | ORAL | 1 refills | Status: DC
  Filled 2021-02-22: qty 30, 30d supply, fill #0

## 2021-02-22 MED ORDER — METFORMIN HCL 500 MG PO TABS
500.0000 mg | ORAL_TABLET | Freq: Every day | ORAL | 1 refills | Status: DC
  Filled 2021-02-22: qty 30, 30d supply, fill #0

## 2021-02-22 MED ORDER — TORSEMIDE 20 MG PO TABS
20.0000 mg | ORAL_TABLET | Freq: Every day | ORAL | 1 refills | Status: DC
  Filled 2021-02-22: qty 30, 30d supply, fill #0

## 2021-02-22 MED ORDER — ACCU-CHEK GUIDE VI STRP
ORAL_STRIP | 12 refills | Status: DC
  Filled 2021-02-22: qty 100, 30d supply, fill #0

## 2021-02-22 MED ORDER — DAPAGLIFLOZIN PROPANEDIOL 10 MG PO TABS
10.0000 mg | ORAL_TABLET | Freq: Every day | ORAL | 1 refills | Status: AC
  Filled 2021-02-22: qty 30, 30d supply, fill #0

## 2021-02-22 MED ORDER — CARVEDILOL 3.125 MG PO TABS
3.1250 mg | ORAL_TABLET | Freq: Two times a day (BID) | ORAL | 1 refills | Status: DC
  Filled 2021-02-22: qty 60, 30d supply, fill #0

## 2021-02-22 MED ORDER — ACCU-CHEK SOFTCLIX LANCETS MISC
5 refills | Status: DC
  Filled 2021-02-22: qty 100, 30d supply, fill #0

## 2021-02-22 MED ORDER — MAGNESIUM OXIDE 400 MG PO TABS
400.0000 mg | ORAL_TABLET | Freq: Every day | ORAL | 1 refills | Status: DC
  Filled 2021-02-22: qty 30, 30d supply, fill #0

## 2021-02-22 MED ORDER — APIXABAN 5 MG PO TABS
5.0000 mg | ORAL_TABLET | Freq: Two times a day (BID) | ORAL | 1 refills | Status: DC
  Filled 2021-02-22: qty 60, 30d supply, fill #0

## 2021-02-22 MED ORDER — SPIRONOLACTONE 25 MG PO TABS
25.0000 mg | ORAL_TABLET | Freq: Every day | ORAL | 1 refills | Status: DC
  Filled 2021-02-22: qty 30, 30d supply, fill #0

## 2021-02-22 MED ORDER — ACCU-CHEK GUIDE W/DEVICE KIT
PACK | 0 refills | Status: DC
  Filled 2021-02-22: qty 1, 1d supply, fill #0

## 2021-02-22 MED ORDER — MAGNESIUM OXIDE -MG SUPPLEMENT 400 (240 MG) MG PO TABS
400.0000 mg | ORAL_TABLET | Freq: Once | ORAL | Status: AC
  Administered 2021-02-22: 400 mg via ORAL
  Filled 2021-02-22: qty 1

## 2021-02-22 MED ORDER — SACUBITRIL-VALSARTAN 24-26 MG PO TABS
1.0000 | ORAL_TABLET | Freq: Two times a day (BID) | ORAL | 1 refills | Status: AC
  Filled 2021-02-22: qty 60, 30d supply, fill #0

## 2021-02-22 MED ORDER — BLOOD GLUCOSE MONITOR KIT
PACK | 0 refills | Status: DC
  Filled 2021-02-22: qty 1, fill #0

## 2021-02-22 MED ORDER — POTASSIUM CHLORIDE CRYS ER 20 MEQ PO TBCR
20.0000 meq | EXTENDED_RELEASE_TABLET | Freq: Every day | ORAL | 1 refills | Status: DC
  Filled 2021-02-22: qty 30, 30d supply, fill #0

## 2021-02-22 MED ORDER — POTASSIUM CHLORIDE CRYS ER 20 MEQ PO TBCR
20.0000 meq | EXTENDED_RELEASE_TABLET | Freq: Every day | ORAL | Status: DC
  Administered 2021-02-22: 20 meq via ORAL
  Filled 2021-02-22: qty 1

## 2021-02-22 NOTE — Progress Notes (Signed)
CARDIAC REHAB PHASE I   PRE:  Rate/Rhythm: 115 ST PVCs  BP:  Supine:   Sitting: 99/66  Standing:    SaO2: 100%RA  MODE:  Ambulation: 670 ft   POST:  Rate/Rhythm: 143 ST PVCs    119 ST PVCs after sitting  BP:  Supine:   Sitting: 103/72  Standing:    SaO2: 97%RA 0800-0827 Pt walked 670 ft on RA with steady gait and denied SOB or palpitations. HR to 143 with walk. Quickly to 119 after sitting and resting. Gave pt walking instructions for ex,( listening to body and  resting as needed) and gave endpoints of exercise.  Pt voiced understanding.    Luetta Nutting, RN BSN  02/22/2021 8:22 AM

## 2021-02-22 NOTE — TOC Transition Note (Addendum)
Transition of Care [facility]) - CM/SW Discharge Note   Patient Details  Name: Debra Daniel MRN: [id number] Date of Birth: 1986/01/20  [facility] CM/SW Contact:  Leone Haven, RN Phone Number: 02/22/2021, 2:34 PM   Clinical Narrative:    NCM spoke with patient, informed her of her follow up apt at the Health dept and assisted her with medications with the Match letter.  Toc pharmacy is filling meds for her.  She states she has transport to go home. She also has a life vest.   Final next level of care: Home/Self Care Barriers to Discharge: No Barriers Identified   Patient Goals and CMS Choice Patient states their goals for this hospitalization and ongoing recovery are:: return home   Choice offered to / list presented to : NA  Discharge Placement                       Discharge Plan and Services In-house Referral: Clinical Social Work                DME Agency: NA       HH Arranged: NA          Social Determinants of Health (SDOH) Interventions Food Insecurity Interventions: Intervention Not Indicated (pt reports getting Sales executive already) Corporate treasurer Interventions: Artist (CSW provided pt with a CAFA application and reached out to [employer] financial to screen pt for [employer]) Housing Interventions: Intervention Not Indicated Transportation Interventions: Intervention Not Indicated   Readmission Risk Interventions No flowsheet data found.

## 2021-02-22 NOTE — Discharge Summary (Signed)
Physician Discharge Summary  Debra Daniel ZPH:[id number] DOB: 09-28-1985 DOA: 02/06/2021  PCP: Patient, No Pcp Per (Inactive)  Admit date: 02/06/2021 Discharge date: 02/22/2021  Time spent: 40 minutes  Recommendations for Outpatient Follow-up:  Follow outpatient CBC/CMP Follow with cardiology outpatient for heart failure  Follow labs on entresto/spironolactone/lasix and mag/k Follow volume status/electrolytes Continue eliquis at least 3-6 months for LV thrombus/PE - recommend following repeat echo - favor long term/indefinite anticoagulation in setting of her multiple risk factors, though recommend follow outpatient with cardiology and PCP  Diagnosed with HIT - avoid heparin products - consider heme follow up  Discharged with life vest, follow with cardiology Mag>2, K>4 Follow hemopytsis on day of discharge, improved Needs repeat thyroid function tests outpatient  Diabetes, new diagnosis, follow with PCP  Discharge Diagnoses:  Active Problems:   Hemoptysis   Acute systolic CHF (congestive heart failure) (HCC)   Pulmonary embolism (HCC)   Hypothyroidism   Tachycardia   Cardiogenic shock (HCC)   Acute blood loss anemia (ABLA)   Discharge Condition: stable  Diet recommendation: heart healthy  Filed Weights   02/20/21 0500 02/21/21 0318 02/22/21 0500  Weight: 92.4 kg 90.3 kg 90.4 kg    History of present illness:  Debra Daniel is Debra Daniel 35 year old female with past medical history significant for migraine headache, obesity recent diagnosis of hypothyroidism and recent nausea/vomiting with UTI symptoms treated with antibiotics outpatient who initially presented to [facility] on 6/27 with progressive lower extremity edema, hemoptysis.  Evaluation in outside facility ED with CTA chest and CT abdomen/pelvis notable for right-sided segmental pulmonary embolism.  She was initially admitted at [facility] started on heparin drip for pulmonary embolism which caused her hemoptysis  become progressively worse; heparin was subsequently stopped.  TTE performed with LVEF 25-30% as well as moderate/severe mitral regurgitation and she was transferred to [facility] on 6/28 under PCCM service for further evaluation and care due to hypotension and hemoptysis.  She was diagnosed with cardiogenic shock here.  Echo showed EF 10-15%.  She had L/RHC with cardiology which showed mildly elevated filling pressures, pulm venous HTN, no significant CAD.  Hospitalization complicated by cardiac arrest in the setting of VT/torsades.  Also, PE and LV thrombus, treated with eliquis.  HIT diagnosed as well.  She's improved now with adjustment of meds by cardiology.  See below for additional details  Hospital Course:  Acute systolic congestive heart failure with cardiogenic shock Nonischemic cardiomyopathy with biventricular heart failure Patient presenting as Debra Daniel transfer from [facility] after being found with reduced LVEF of 25-30% with progressive lower extremity edema, TTE on 6/28 2022 with LVEF 10-15%, mildly dilated LV, moderate to severely reduced RV function with mildly dilated RV with RVSP 45 mmHg severe MR and moderate/severe TR.  Suspected etiology from her underlying hypothyroidism versus viral myocarditis versus familial cardiomyopathy.  Initially requiring vasopressors with norepinephrine and milrinone drip and Lasix drip which have been discontinued. --Cardiology/heart failure service following, appreciate assistance - may need advanced therapies, RV dysfunction precludes VAD, BMI borderline for transplant --Net negative 7.8 L since admission --Digoxin 0.125 mg p.o. daily --coreg 3.125 mg  --entresto 24-26 mg BID --Torsemide 20 mg p.o. daily today --Spironolactone 25 mg p.o. daily --Dapagliflozin 39m PO daily --Strict I's and O's and daily weights -- follow with cardiology outpatient    NSVT/torsades de points V. fib arrest Multifocal atrial tachycardia Patient's  developed intraventricular fibrillation arrest requiring CPR for 4 minutes with defibrillation x2 on 02/13/2021 with ROSC achieved. --Cardiology  following as above --Continue to monitor electrolytes daily, K goal>4 and Mag goal >2 --Avoid QTC prolonging medications --Monitor on telemetry -- ivabradine d/c'd --TOC consulted for LifeVest on discharge; will need repeat TTE 1 month   Sinus Tachycardia -- follow with coreg   Hemoptysis Right lower lobe pulmonary embolism HIT Patient initially presented to outside facility with mild hemoptysis, worsens while on heparin drip which was discontinued.  Was placed on bivalirudin drip for HIT (positive heparin-induced platelet antibodies, 2.313 on 7/6).  Lower extremity Doppler ultrasounds negative for DVT on 6/29 2022.  Transfused 1 unit PRBC on 02/19/2021. --Bivalirudin drip transition to Eliquis 10 mg p.o. BID x 7d followed by 39m PO BID (starting 5 mg BID tonight, 1 dose short, due to complexity, previously on anticoagulation, follow), hemoglobin stable, platelet level improved --Serotonin release assay: positive --Hb stable, s/p 1 unit pRBC --Chest x-ray with bibasilar infiltrates R>L without focal effusion - hemoptysis seems to be decreased per discussion? Old blood?  Follow closely. --CBC daily -- heparin added to allergy list   LV Thrombus -- currently on eliquis   Hypothyroidism TSH 13.5 and FT4 1.39 --recently started Levothyroxine 732m PO daily --will need repeat TFTs 4 weeks   Elevated LFTs: Resolved Elevated INR Etiology likely secondary to hepatic congestion from biventricular congestive heart failure. --CMP/INR daily   Acute blood loss anemia Thrombocytopenia Etiology likely secondary to hemoptysis combined with HIT. --Hb stable, platelets wnl --follow on eliquis   Community-acquired pneumonia CT chest 6/29 with areas of consolidation right lower lobe, left lower lobe and right middle lobe compatible with multifocal  pneumonia.   --WBC count remains elevated, follow outpatient --Sputum culture 7/7 with normal flora, no staph/pseudomonas noted --s/p 5 days vanc/cefepime --Monitor CBC daily  T2DM A1c 6.7 Follow on farxiga, metformin  Procedures: Echo 1. Mildly elevated filling pressures. 2. Pulmonary venous hypertension. 3. Cardiac output ok on milrinone 0.25 4. No significant coronary disease.   Nonischemic cardiomyopathy.  LE [place]:  BILATERAL:  - No evidence of deep vein thrombosis seen in the lower extremities,  bilaterally.  -No evidence of popliteal cyst, bilaterally.     R/LHC 1. Mildly elevated filling pressures. 2. Pulmonary venous hypertension. 3. Cardiac output ok on milrinone 0.25 4. No significant coronary disease.   Nonischemic cardiomyopathy.   Consultations: PCCM Cardiology EP  Discharge Exam: Vitals:   02/22/21 0913 02/22/21 1158  BP: 107/74 101/73  Pulse: (!) 116 (!) 108  Resp:  12  Temp:  98.3 F (36.8 C)  SpO2: 100% 99%   See PN from earlier today   Discharge Instructions   Discharge Instructions     (HEART FAILURE PATIENTS) Call MD:  Anytime you have any of the following symptoms: 1) 3 pound weight gain in 24 hours or 5 pounds in 1 week 2) shortness of breath, with or without Debra Daniel dry hacking cough 3) swelling in the hands, feet or stomach 4) if you have to sleep on extra pillows at night in order to breathe.   Complete by: As directed    (HEART FAILURE PATIENTS) Call MD:  Anytime you have any of the following symptoms: 1) 3 pound weight gain in 24 hours or 5 pounds in 1 week 2) shortness of breath, with or without Debra Daniel dry hacking cough 3) swelling in the hands, feet or stomach 4) if you have to sleep on extra pillows at night in order to breathe.   Complete by: As directed    Avoid straining   Complete  by: As directed    Call MD for:  difficulty breathing, headache or visual disturbances   Complete by: As directed    Call MD for:  difficulty  breathing, headache or visual disturbances   Complete by: As directed    Call MD for:  extreme fatigue   Complete by: As directed    Call MD for:  extreme fatigue   Complete by: As directed    Call MD for:  hives   Complete by: As directed    Call MD for:  hives   Complete by: As directed    Call MD for:  persistant dizziness or light-headedness   Complete by: As directed    Call MD for:  persistant dizziness or light-headedness   Complete by: As directed    Call MD for:  persistant nausea and vomiting   Complete by: As directed    Call MD for:  persistant nausea and vomiting   Complete by: As directed    Call MD for:  redness, tenderness, or signs of infection (pain, swelling, redness, odor or green/yellow discharge around incision site)   Complete by: As directed    Call MD for:  redness, tenderness, or signs of infection (pain, swelling, redness, odor or green/yellow discharge around incision site)   Complete by: As directed    Call MD for:  severe uncontrolled pain   Complete by: As directed    Call MD for:  severe uncontrolled pain   Complete by: As directed    Call MD for:  temperature >100.4   Complete by: As directed    Call MD for:  temperature >100.4   Complete by: As directed    Diet - low sodium heart healthy   Complete by: As directed    Diet - low sodium heart healthy   Complete by: As directed    Diet Carb Modified   Complete by: As directed    Discharge instructions   Complete by: As directed    You were seen for new onset heart failure.  Your ejection fraction (heart squeeze or pump) is 10-15% (normal is 55-60%).  The cause is unclear, but could be familial (genetics) or related to Debra Daniel viral infection, etc.  Please follow up with cardiology as planned.  You've been started on medicines for your heart failure, including entresto, farxiga, spironolactone, digoxin, and coreg.    Check your weights daily.  If your weight increases by 3 lbs in [place] day or 5 lbs in [place]  week, call your PCP for additional instructions.  You'll go home with the life vest for your arrhythmias.  Take potassium and magnesium daily as prescribed.  You had Debra Daniel pulmonary embolism.  You've been started on eliquis.  You also had Debra Daniel LV thrombus (clot in the heart, left ventricle).  The eliquis should treat this as well.  You'll need Debra Daniel follow up echo in Debra Daniel couple of months.  Your anticoagulation should be continued at least 3-6 months.  Please discuss with cardiology and your PCP plans for long term anticoagulation (I favor longer term anticoagulation for you given your many risk factors).    Stop your birth control pills as this puts you at risk for clots.  Please follow up contraception plans with your PCP or gynecologist.   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  You should avoid any heparin products and let your doctors know about this diagnosis.  You have diabetes, you'll go home on farxiga and metformin.  Follow  up with your PCP.    You need repeat thyroid function tests outpatient.   You were treated for pneumonia and have improved.  Your hemoptysis (coughing up blood) has improved.  Return for new, recurrent, or worsening symptoms.  Please ask your PCP to request records from this hospitalization so they know what was done and what the next steps will be.   Heart Failure patients record your daily weight using the same scale at the same time of day   Complete by: As directed    Increase activity slowly   Complete by: As directed    Increase activity slowly   Complete by: As directed    STOP any activity that causes chest pain, shortness of breath, dizziness, sweating, or exessive weakness   Complete by: As directed       Allergies as of 02/22/2021       Reactions   Heparin Anaphylaxis   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; SRA + 7/8   Sulfa Antibiotics Hives   Ibuprofen Other (See Comments)   Constipation Can tolerate w milk of mag        Medication List      STOP taking these medications    amoxicillin-clavulanate 875-125 MG tablet Commonly known as: AUGMENTIN   Daysee 0.15-0.03 &0.01 MG tablet Generic drug: Levonorgestrel-Ethinyl Estradiol   nitrofurantoin (macrocrystal-monohydrate) 100 MG capsule Commonly known as: MACROBID   ondansetron 4 MG tablet Commonly known as: ZOFRAN       TAKE these medications    acetaminophen 500 MG tablet Commonly known as: TYLENOL Take 1,000 mg by mouth every 6 (six) hours as needed for mild pain.   apixaban 5 MG Tabs tablet Commonly known as: ELIQUIS Take 1 tablet (5 mg total) by mouth 2 (two) times daily. Start taking on: February 23, 2021   blood glucose meter kit and supplies Kit Dispense based on patient and insurance preference. Use up to four times daily as directed.   carvedilol 3.125 MG tablet Commonly known as: COREG Take 1 tablet (3.125 mg total) by mouth 2 (two) times daily with Debra Daniel meal.   dapagliflozin propanediol 10 MG Tabs tablet Commonly known as: FARXIGA Take 1 tablet (10 mg total) by mouth daily. Start taking on: February 23, 2021   digoxin 0.125 MG tablet Commonly known as: LANOXIN Take 1 tablet (0.125 mg total) by mouth daily. Start taking on: February 23, 2021   levothyroxine 75 MCG tablet Commonly known as: SYNTHROID Take 75 mcg by mouth daily.   magnesium oxide 400 (240 Mg) MG tablet Commonly known as: MAG-OX Take 1 tablet (400 mg total) by mouth daily. Start taking on: February 23, 2021   metFORMIN 500 MG tablet Commonly known as: Glucophage Take 1 tablet (500 mg total) by mouth daily with breakfast.   potassium chloride SA 20 MEQ tablet Commonly known as: KLOR-CON Take 1 tablet (20 mEq total) by mouth daily. Start taking on: February 23, 2021   promethazine 25 MG tablet Commonly known as: PHENERGAN Take 1 tablet (25 mg total) by mouth every 6 (six) hours as needed for nausea or vomiting.   sacubitril-valsartan 24-26 MG Commonly known as: ENTRESTO Take 1 tablet by  mouth 2 (two) times daily.   spironolactone 25 MG tablet Commonly known as: ALDACTONE Take 1 tablet (25 mg total) by mouth daily. Start taking on: February 23, 2021   torsemide 20 MG tablet Commonly known as: DEMADEX Take 1 tablet (20 mg total) by mouth daily. Start taking on: February 23, 2021               Durable Medical Equipment  (From admission, onward)           Start     Ordered   02/17/21 1319  For home use only DME Vest life vest  Once       Comments: length of need 3 months   02/17/21 1318           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   Sulfa Antibiotics Hives   Ibuprofen Other (See Comments)    Constipation Can tolerate w milk of [place], [facility] Personal Follow up.   Why: Please call this location to see if they are accepting new patient's without inusrance. Contact information: [address] [place] [address] [phone]         [facility] Follow up.   Specialty: Cardiology Why: July 22nd at 10 am [facility] [place] garage code [place] information: [address] [phone] [facility] [facility] [phone]                 The results of significant diagnostics from this hospitalization (including imaging, microbiology, ancillary and laboratory) are listed below for reference.    Significant Diagnostic Studies: DG Chest 2 View  Result Date: 02/20/2021 CLINICAL DATA:  Hemoptysis EXAM: CHEST - 2 VIEW COMPARISON:  02/16/2021 FINDINGS: Cardiac shadow is within normal limits. Right-sided PICC line is noted at the cavoatrial junction. Mild bibasilar airspace opacity is noted right greater than left. No sizable effusion is seen. No bony abnormality is noted. IMPRESSION: PICC line as described.  Bibasilar infiltrates right greater than left without focal effusion. Electronically Signed   By: Inez Catalina M.D.   On: 02/20/2021 17:27   CT CHEST WO CONTRAST  Result Date: 02/06/2021 CLINICAL DATA:  Hemoptysis EXAM: CT CHEST WITHOUT CONTRAST TECHNIQUE: Multidetector CT imaging of the chest was performed following the standard protocol without IV contrast. COMPARISON:  None. FINDINGS: Cardiovascular: Heart is normal size. Aorta is normal caliber. Mediastinum/Nodes: Soft tissue in the anterior mediastinum felt represent residual thymus. No mediastinal, hilar, or axillary adenopathy. Trachea and esophagus are unremarkable. Thyroid unremarkable. Lungs/Pleura: Dense consolidation in the right lower lobe. Ground-glass airspace opacities in the right middle lobe. Consolidation also noted in the left lower lobe. Findings compatible with pneumonia. No effusions. Upper Abdomen: Imaging into the upper abdomen demonstrates no acute findings. Musculoskeletal: Chest wall soft tissues are unremarkable. No acute bony abnormality. IMPRESSION: Areas of consolidation in the right lower lobe, left lower lobe and right middle lobe compatible with multifocal pneumonia. Electronically Signed   By: Rolm Baptise M.D.   On: 02/06/2021 22:35   CARDIAC CATHETERIZATION  Result Date: 02/14/2021 1. Mildly elevated filling pressures. 2. Pulmonary venous hypertension. 3. Cardiac output ok on milrinone 0.25 4. No significant coronary disease. Nonischemic cardiomyopathy.   DG Chest Port 1 View  Result Date: 02/16/2021 CLINICAL DATA:  Coughing up blood EXAM: PORTABLE CHEST 1 VIEW COMPARISON:  02/13/2021 FINDINGS: Right-sided PICC line with the tip projecting over the cavoatrial junction. Bilateral lower lung patchy airspace disease likely reflecting persistent pneumonia. No pleural effusion or pneumothorax. Heart and mediastinal contours are unremarkable. No acute osseous abnormality. IMPRESSION: Bilateral lower lung patchy airspace  disease likely reflecting persistent pneumonia. Electronically Signed   By: Kathreen Devoid   On: 02/16/2021 09:02   DG Chest Port 1 View  Result Date: 02/13/2021 CLINICAL DATA:  Acute respiratory failure EXAM: PORTABLE CHEST 1 VIEW COMPARISON:  Radiograph and CT 02/06/2021 FINDINGS: Right upper extremity PICC tip is in the mid SVC. Lung volumes are low. Right greater than left basilar consolidations with progression from prior exam. Heart size is prominent but stable. No large pleural effusion or pneumothorax. IMPRESSION: 1. Right upper extremity PICC tip in the mid SVC. 2. Right greater than left basilar consolidations with progression from prior exam, suspicious for worsening pneumonia. 3. Prominent heart size is likely accentuated by portable technique and low lung volumes. Electronically Signed   By: Keith Rake M.D.   On: 02/13/2021 21:48   DG CHEST PORT 1 VIEW  Result Date: 02/06/2021 CLINICAL DATA:  35 year old female with hemoptysis. EXAM: PORTABLE CHEST 1 VIEW COMPARISON:  None. FINDINGS: Focal right lung base opacity may represent atelectasis but concerning for pneumonia. Clinical correlation is recommended. The left lung is clear. No pleural effusion pneumothorax. There is mild cardiomegaly. No acute osseous pathology. IMPRESSION: Right lung base opacity concerning for pneumonia. Electronically Signed   By: Anner Crete M.D.   On: 02/06/2021 19:44   MR CARDIAC MORPHOLOGY W WO CONTRAST  Result Date: 02/15/2021 CLINICAL DATA:  Cardiomyopathy of uncertain etiology. EXAM: CARDIAC MRI TECHNIQUE: The patient was scanned on Debra Daniel 1.5 Tesla GE magnet. Chairty Toman dedicated cardiac coil was used. Functional imaging was done using Fiesta sequences. 2,3, and 4 chamber views were done to assess for RWMA's. Modified Simpson's rule using Debra Daniel short axis stack was used to calculate an ejection fraction on Debra Daniel dedicated work Conservation officer, nature. The patient received 8 cc of Gadavist. After 10 minutes inversion  recovery sequences were used to assess for infiltration and scar tissue. FINDINGS: Right lung base consolidation noted.  Small right pleural effusion. Moderately dilated left ventricle with normal wall thickness, diffuse hypokinesis with EF 18%. There is Cadie Sorci left ventricular apical thrombus, 1.5 cm. Moderately dilated right ventricle with RV EF 26%. Mild biatrial enlargement. Trileaflet aortic valve with no significant stenosis or regurgitation. Mild mitral regurgitation, regurgitant fraction 15%. Delayed enhancement images are difficult. Possible subtle mid-wall late gadolinium enhancement (LGE) in the mid inferior wall and the mid anterolateral wall. Measurements: LVEDV 302 mL LVSV 53 mL LVEF 18% RVEDV 208 mL RVSV 54 mL RVEF 26% Aortic forward volume 45 mL Mitral regurgitant fraction 15% IMPRESSION: 1.  Moderately dilated LV with EF 18%, diffuse hypokinesis. 2.  LV thrombus noted. 3.  Moderately dilated RV with EF 26%. 4.  MR looks mild by cMRI, regurgitant fraction 15%. 5. Subtle non-coronary mid-wall LGE pattern in the inferior and anterolateral walls, possible prior myocarditis. 6.  Consolidation of the right lung base. Dalton Mclean Electronically Signed   By: Loralie Champagne M.D.   On: 02/15/2021 18:15   VAS [place] LOWER EXTREMITY VENOUS (DVT)  Result Date: 02/07/2021  Lower Venous DVT Study Patient Name:  Debra Daniel  Date of Exam:   02/07/2021 Medical Rec #: [id number]      Accession #:    [id number] Date of Birth: 11-27-1985      Patient Gender: F Patient Age:   89Y Exam Location:  [facility] Procedure:      VAS [place] LOWER EXTREMITY VENOUS (DVT) Referring Phys: [id number] MURALI RAMASWAMY --------------------------------------------------------------------------------  Indications: Edema.  Limitations: Body habitus. Comparison Study: no prior  Performing Technologist: Archie Patten RVS  Examination Guidelines: Marely Apgar complete evaluation includes B-mode imaging, spectral Doppler, color Doppler, and power Doppler  as needed of all accessible portions of each vessel. Bilateral testing is considered an integral part of Omario Ander complete examination. Limited examinations for reoccurring indications may be performed as noted. The reflux portion of the exam is performed with the patient in reverse Trendelenburg.  +---------+---------------+---------+-----------+----------+-------------------+ RIGHT    CompressibilityPhasicitySpontaneityPropertiesThrombus Aging      +---------+---------------+---------+-----------+----------+-------------------+ CFV      Full           Yes      Yes                                      +---------+---------------+---------+-----------+----------+-------------------+ SFJ      Full                                                             +---------+---------------+---------+-----------+----------+-------------------+ FV Prox  Full                                                             +---------+---------------+---------+-----------+----------+-------------------+ FV Mid   Full                                                             +---------+---------------+---------+-----------+----------+-------------------+ FV Distal               Yes      Yes                                      +---------+---------------+---------+-----------+----------+-------------------+ PFV      Full                                                             +---------+---------------+---------+-----------+----------+-------------------+ POP      Full           Yes      Yes                                      +---------+---------------+---------+-----------+----------+-------------------+ PTV      Full                                                             +---------+---------------+---------+-----------+----------+-------------------+  PERO                                                  Not well visualized  +---------+---------------+---------+-----------+----------+-------------------+   +---------+---------------+---------+-----------+----------+-------------------+ LEFT     CompressibilityPhasicitySpontaneityPropertiesThrombus Aging      +---------+---------------+---------+-----------+----------+-------------------+ CFV      Full           Yes      Yes                                      +---------+---------------+---------+-----------+----------+-------------------+ SFJ      Full                                                             +---------+---------------+---------+-----------+----------+-------------------+ FV Prox  Full                                                             +---------+---------------+---------+-----------+----------+-------------------+ FV Mid                  Yes      Yes                                      +---------+---------------+---------+-----------+----------+-------------------+ FV Distal               Yes      Yes                                      +---------+---------------+---------+-----------+----------+-------------------+ PFV      Full                                                             +---------+---------------+---------+-----------+----------+-------------------+ POP      Full           Yes      Yes                                      +---------+---------------+---------+-----------+----------+-------------------+ PTV      Full                                                             +---------+---------------+---------+-----------+----------+-------------------+ PERO  Not well visualized +---------+---------------+---------+-----------+----------+-------------------+     Summary: BILATERAL: - No evidence of deep vein thrombosis seen in the lower extremities, bilaterally. -No evidence of popliteal cyst, bilaterally.   *See  table(s) above for measurements and observations. Electronically signed by Curt Jews MD on 02/07/2021 at 2:57:05 PM.    Final    ECHOCARDIOGRAM LIMITED  Result Date: 02/06/2021    ECHOCARDIOGRAM REPORT   Patient Name:   Debra Daniel Date of Exam: 02/06/2021 Medical Rec #:  [id number]     Height:       64.5 in Accession #:    [id number]    Weight:       227.1 lb Date of Birth:  [birth year]     BSA:          2.076 m Patient Age:    35 years      BP:           109/88 mmHg Patient Gender: F             HR:           132 bpm. Exam Location:  Inpatient Procedure: Limited Echo, Limited Color Doppler and Cardiac Doppler STAT ECHO Indications:    Cardiomyopathy  History:        Patient has prior history of Echocardiogram examinations. CHF;                 Signs/Symptoms:Hemoptysis.  Sonographer:    Johny Chess Referring Phys: [place]  1. Left ventricular ejection fraction, by estimation, is 10-15%. The left ventricle has severely decreased function. The left ventricle demonstrates global hypokinesis. The left ventricular internal cavity size was moderately dilated. Left ventricular diastolic function could not be evaluated.  2. Right ventricular systolic function is moderate-severely reduced. The right ventricular size is moderately enlarged. There is moderately elevated pulmonary artery systolic pressure. The estimated right ventricular systolic pressure is 35.0 mmHg.  3. Left atrial size was mildly dilated.  4. The mitral valve is grossly normal. Severe, secondary mitral valve regurgitation with leaflet tenting secondary to LV dysfunction. No evidence of mitral stenosis.  5. Tricuspid valve regurgitation is severe.  6. The aortic valve is normal in structure. Aortic valve regurgitation is not visualized.  7. The inferior vena cava is normal in size with <50% respiratory variability, suggesting right atrial pressure of 8 mmHg. FINDINGS  Left Ventricle: Left ventricular ejection fraction, by  estimation, is 10-15%. The left ventricle has severely decreased function. The left ventricle demonstrates global hypokinesis. The left ventricular internal cavity size was moderately dilated. There  is no left ventricular hypertrophy. Left ventricular diastolic function could not be evaluated. Right Ventricle: The right ventricular size is moderately enlarged. No increase in right ventricular wall thickness. Right ventricular systolic function is moderate-severely reduced. There is moderately elevated pulmonary artery systolic pressure. The tricuspid regurgitant velocity is 3.08 m/s, and with an assumed right atrial pressure of 8 mmHg, the estimated right ventricular systolic pressure is 09.3 mmHg. Left Atrium: Left atrial size was mildly dilated. Right Atrium: Right atrial size was normal in size. Pericardium: There is no evidence of pericardial effusion. Mitral Valve: The mitral valve is grossly normal. Severe mitral valve regurgitation. No evidence of mitral valve stenosis. MV peak gradient, 85.0 mmHg. Tricuspid Valve: The tricuspid valve is grossly normal. Tricuspid valve regurgitation is severe. Aortic Valve: The aortic valve is normal in structure. Aortic valve regurgitation is not visualized. Pulmonic Valve: The pulmonic valve was not well visualized. Aorta: The  aortic root is normal in size and structure. Pulmonary Artery: The pulmonary artery is of normal size. Venous: The inferior vena cava is normal in size with less than 50% respiratory variability, suggesting right atrial pressure of 8 mmHg. IAS/Shunts: No atrial level shunt detected by color flow Doppler.  LEFT VENTRICLE PLAX 2D LVIDd:         6.00 cm  Diastology LVIDs:         5.40 cm  LV e' medial:  6.85 cm/s LV PW:         1.10 cm  LV e' lateral: 12.90 cm/s LV IVS:        0.90 cm LVOT diam:     2.00 cm LV SV:         18 LV SV Index:   9 LVOT Area:     3.14 cm  IVC IVC diam: 2.20 cm LEFT ATRIUM           Index LA diam:      5.00 cm 2.41 cm/m LA  Vol (A4C): 79.3 ml 38.20 ml/m  AORTIC VALVE LVOT Vmax:   48.40 cm/s LVOT Vmean:  31.300 cm/s LVOT VTI:    0.058 m  AORTA Ao Asc diam: 2.30 cm MITRAL VALVE                 TRICUSPID VALVE MV Peak grad: 85.0 mmHg      TR Peak grad:   37.9 mmHg MV Vmax:      4.61 m/s       TR Vmax:        308.00 cm/s MR Peak grad:    91.4 mmHg MR Mean grad:    57.0 mmHg   SHUNTS MR Vmax:         478.00 cm/s Systemic VTI:  0.06 m MR Vmean:        365.0 cm/s  Systemic Diam: 2.00 cm MR PISA:         1.27 cm MR PISA Eff ROA: 11 mm MR PISA Radius:  0.45 cm Debra Kaiser MD Electronically signed by Debra Kaiser MD Signature Date/Time: 02/06/2021/9:39:14 PM    Final    [place] EKG SITE RITE  Result Date: 02/11/2021 If Site Rite image not attached, placement could not be confirmed due to current cardiac rhythm.  [place] EKG SITE RITE  Result Date: 02/06/2021 If Site Rite image not attached, placement could not be confirmed due to current cardiac rhythm.   Microbiology: Recent Results (from the past 240 hour(s))  SARS Coronavirus 2 by RT PCR (hospital order, performed in [facility] hospital lab) Nasopharyngeal Nasopharyngeal Swab     Status: None   Collection Time: 02/13/21  3:23 AM   Specimen: Nasopharyngeal Swab  Result Value Ref Range Status   SARS Coronavirus 2 NEGATIVE NEGATIVE Final    Comment: (NOTE) SARS-CoV-2 target nucleic acids are NOT DETECTED.  The SARS-CoV-2 RNA is generally detectable in upper and lower respiratory specimens during the acute phase of infection. The lowest concentration of SARS-CoV-2 viral copies this assay can detect is 250 copies / mL. Lashaya Kienitz negative result does not preclude SARS-CoV-2 infection and should not be used as the sole basis for treatment or other patient management decisions.  Lamin Chandley negative result may occur with improper specimen collection / handling, submission of specimen other than nasopharyngeal swab, presence of viral mutation(s) within the areas targeted by this assay, and  inadequate number of viral copies (<250 copies / mL). Timira Bieda negative result must be combined  with clinical observations, patient history, and epidemiological information.  Fact Sheet for Patients:   StrictlyIdeas.no  Fact Sheet for Healthcare Providers: BankingDealers.co.za  This test is not yet approved or  cleared by the [place] FDA and has been authorized for detection and/or diagnosis of SARS-CoV-2 by FDA under an Emergency Use Authorization (EUA).  This EUA will remain in effect (meaning this test can be used) for the duration of the COVID-19 declaration under Section 564(b)(1) of the Act, 21 U.S.C. section 360bbb-3(b)(1), unless the authorization is terminated or revoked sooner.  Performed at [facility], [place] [address], [place], [place] [address]   Expectorated Sputum Assessment w Gram Stain, Rflx to Resp Cult     Status: None   Collection Time: 02/15/21 11:49 AM   Specimen: Expectorated Sputum  Result Value Ref Range Status   Specimen Description EXPECTORATED SPUTUM  Final   Special Requests NONE  Final   Sputum evaluation   Final    THIS SPECIMEN IS ACCEPTABLE FOR SPUTUM CULTURE Performed at [facility], [place] [address] [place], [place] [address]    Report Status 02/16/2021 FINAL  Final  Culture, Respiratory w Gram Stain     Status: None   Collection Time: 02/15/21 11:49 AM  Result Value Ref Range Status   Specimen Description EXPECTORATED SPUTUM  Final   Special Requests NONE Reflexed from [id number]  Final   Gram Stain   Final    ABUNDANT WBC PRESENT,BOTH PMN AND MONONUCLEAR RARE GRAM POSITIVE COCCI    Culture   Final    RARE Normal respiratory flora-no Staph aureus or Pseudomonas seen Performed at [facility], [place] [address] [place], [place] [address]    Report Status 02/18/2021 FINAL  Final     Labs: Basic Metabolic Panel: Recent Labs  Lab 02/18/21 0511 02/19/21 0500 02/20/21 0440  02/21/21 0605 02/22/21 0639  NA 137 133* 138 139 138  K 4.2 3.6 4.2 3.7 4.0  CL 99 98 108 107 107  CO2 28 27 23 24 24   GLUCOSE 97 97 89 95 95  BUN 19 28* 21* 23* 20  CREATININE 0.90 0.98 0.81 0.85 0.74  CALCIUM 8.2* 7.5* 8.0* 8.2* 8.0*  MG 1.9 2.0 2.0 1.9 1.9  PHOS  --   --   --   --  3.3   Liver Function Tests: Recent Labs  Lab 02/17/21 0409 02/18/21 0511 02/19/21 0500 02/20/21 0440 02/22/21 0639  AST 44* 56* 36 73* 71*  ALT 39 45* 40 51* 75*  ALKPHOS 60 66 61 68 81  BILITOT 1.9* 2.0* 1.8* 2.2* 1.6*  PROT 4.8* 5.3* 5.2* 5.3* 5.3*  ALBUMIN 1.8* 1.9* 1.8* 1.8* 1.9*   No results for input(s): LIPASE, AMYLASE in the last 168 hours. No results for input(s): AMMONIA in the last 168 hours. CBC: Recent Labs  Lab 02/18/21 0511 02/19/21 0500 02/20/21 0440 02/21/21 0605 02/22/21 0639  WBC 20.8* 23.4* 19.0* 18.4* 17.6*  HGB 9.0* 7.3* 9.7* 9.7* 9.5*  HCT 29.0* 23.5* 30.0* 30.8* 30.3*  MCV 81.9 81.6 80.6 81.9 83.0  PLT 197 272 275 294 311   Cardiac Enzymes: No results for input(s): CKTOTAL, CKMB, CKMBINDEX, TROPONINI in the last 168 hours. BNP: BNP (last 3 results) Recent Labs    02/06/21 2018  BNP 1,571.8*    ProBNP (last 3 results) No results for input(s): PROBNP in the last 8760 hours.  CBG: Recent Labs  Lab 02/21/21 1129 02/21/21 1548 02/21/21 2125 02/22/21 0737  02/22/21 1157  GLUCAP 101* 96 99 102* 97       Signed:  Fayrene Helper MD.  [facility] 02/22/2021, 1:51 PM

## 2021-02-22 NOTE — Progress Notes (Signed)
PROGRESS NOTE    Debra Daniel  WIO:[id number] DOB: 10-27-1985 DOA: 02/06/2021 PCP: Patient, No Pcp Per (Inactive)  No chief complaint on file.  Brief Narrative:  Debra Daniel is Debra Daniel 35 year old female with past medical history significant for migraine headache, obesity recent diagnosis of hypothyroidism and recent nausea/vomiting with UTI symptoms treated with antibiotics outpatient who initially presented to [facility] on 6/27 with progressive lower extremity edema, hemoptysis.  Evaluation in outside facility ED with CTA chest and CT abdomen/pelvis notable for right-sided segmental pulmonary embolism.  She was initially admitted at [facility] started on heparin drip for pulmonary embolism which caused her hemoptysis become progressively worse; heparin was subsequently stopped.  TTE performed with LVEF 25-30% as well as moderate/severe mitral regurgitation and she was transferred to [facility] on 6/28 under PCCM service for further evaluation and care due to hypotension and hemoptysis.   Assessment & Plan:   Active Problems:   Hemoptysis   Acute systolic CHF (congestive heart failure) (HCC)   Pulmonary embolism (HCC)   Hypothyroidism   Tachycardia   Cardiogenic shock (HCC)   Acute blood loss anemia (ABLA)  Acute systolic congestive heart failure with cardiogenic shock Nonischemic cardiomyopathy with biventricular heart failure Patient presenting as Debra Daniel transfer from [facility] after being found with reduced LVEF of 25-30% with progressive lower extremity edema, TTE on 6/28 2022 with LVEF 10-15%, mildly dilated LV, moderate to severely reduced RV function with mildly dilated RV with RVSP 45 mmHg severe MR and moderate/severe TR.  Suspected etiology from her underlying hypothyroidism versus viral myocarditis versus familial cardiomyopathy.  Initially requiring vasopressors with norepinephrine and milrinone drip and Lasix drip which have been  discontinued. --Cardiology/heart failure service following, appreciate assistance - may need advanced therapies, RV dysfunction precludes VAD, BMI borderline for transplant --Net negative 7.8 L since admission --Digoxin 0.125 mg p.o. daily --continue  on coreg 3.125 mg  --entresto 24-26 mg BID --Decreased Torsemide to 20 mg p.o. daily today --Spironolactone 25 mg p.o. daily --Dapagliflozin 10mg  PO daily --Strict I's and O's and daily weights   NSVT/torsades de points V. fib arrest Multifocal atrial tachycardia Patient's developed intraventricular fibrillation arrest requiring CPR for 4 minutes with defibrillation x2 on 02/13/2021 with ROSC achieved. --Cardiology following as above --Continue to monitor electrolytes daily, K goal>4 and Mag goal >2 --Avoid QTC prolonging medications --Monitor on telemetry -- ivabradine d/c'd --TOC consulted for LifeVest on discharge; will need repeat TTE 1 month   Sinus Tachycardia -- follow with coreg  Hemoptysis Right lower lobe pulmonary embolism HIT Patient initially presented to outside facility with mild hemoptysis, worsens while on heparin drip which was discontinued.  Was placed on bivalirudin drip for HIT (positive heparin-induced platelet antibodies, 2.313 on 7/6).  Lower extremity Doppler ultrasounds negative for DVT on 6/29 2022.  Transfuse 1 unit PRBC on 02/19/2021. --Bivalirudin drip transition to Eliquis 10 mg p.o. BID x 7d followed by 5mg  PO BID, hemoglobin stable, platelet level improved  --Serotonin release assay: positive --Hb stable, s/p 1 unit pRBC --Chest x-ray with bibasilar infiltrates R>L without focal effusion - hemoptysis seems to be decreased per discussion? Old blood?  Follow closely. --CBC daily -- heparin added to allergy list   LV Thrombus -- currently on eliquis  Hypothyroidism TSH 13.5 and FT4 1.39 --recently started Levothyroxine 04/22/2021 PO daily --will need repeat TFTs 4 weeks   Elevated LFTs:  Resolved Elevated INR Etiology likely secondary to hepatic congestion from biventricular congestive heart failure. --CMP/INR daily   Acute  blood loss anemia Thrombocytopenia Etiology likely secondary to hemoptysis combined with HIT. --Hb stable, platelets wnl  --follow on eliquis   Community-acquired pneumonia CT chest 6/29 with areas of consolidation right lower lobe, left lower lobe and right middle lobe compatible with multifocal pneumonia.   --WBC count remains elevated, follow  --Sputum culture 7/7 with normal flora, no staph/pseudomonas noted --s/p 5 days vanc/cefepime --Monitor CBC daily   DVT prophylaxis: eliquis Code Status:full Family Communication: parents at bedside Disposition:   Status is: Inpatient  Remains inpatient appropriate because:Inpatient level of care appropriate due to severity of illness  Dispo:  Patient From: Home  Planned Disposition: Home  Medically stable for discharge: No         Consultants:  Cardiology PCCM EP  Procedures: Cath 1. Mildly elevated filling pressures. 2. Pulmonary venous hypertension. 3. Cardiac output ok on milrinone 0.25 4. No significant coronary disease.   Nonischemic cardiomyopathy.  LE [place] Summary:  BILATERAL:  - No evidence of deep vein thrombosis seen in the lower extremities,  bilaterally.  -No evidence of popliteal cyst, bilaterally.     Echo IMPRESSIONS     1. Left ventricular ejection fraction, by estimation, is 10-15%. The left  ventricle has severely decreased function. The left ventricle demonstrates  global hypokinesis. The left ventricular internal cavity size was  moderately dilated. Left ventricular  diastolic function could not be evaluated.   2. Right ventricular systolic function is moderate-severely reduced. The  right ventricular size is moderately enlarged. There is moderately  elevated pulmonary artery systolic pressure. The estimated right  ventricular systolic pressure is  45.9 mmHg.   3. Left atrial size was mildly dilated.   4. The mitral valve is grossly normal. Severe, secondary mitral valve  regurgitation with leaflet tenting secondary to LV dysfunction. No  evidence of mitral stenosis.   5. Tricuspid valve regurgitation is severe.   6. The aortic valve is normal in structure. Aortic valve regurgitation is  not visualized.   7. The inferior vena cava is normal in size with <50% respiratory  variability, suggesting right atrial pressure of 8 mmHg.    Antimicrobials:  Anti-infectives (From admission, onward)    Start     Dose/Rate Route Frequency Ordered Stop   02/18/21 1000  vancomycin (VANCOREADY) IVPB 1750 mg/350 mL        1,750 mg 175 mL/hr over 120 Minutes Intravenous Every 24 hours 02/17/21 1127 02/21/21 2359   02/17/21 1215  vancomycin (VANCOREADY) IVPB 2000 mg/400 mL        2,000 mg 200 mL/hr over 120 Minutes Intravenous  Once 02/17/21 1127 02/17/21 1428   02/16/21 0830  ceFEPIme (MAXIPIME) 2 g in sodium chloride 0.9 % 100 mL IVPB        2 g 200 mL/hr over 30 Minutes Intravenous Every 8 hours 02/16/21 0730 02/21/21 0700   02/10/21 1000  cefTRIAXone (ROCEPHIN) 2 g in sodium chloride 0.9 % 100 mL IVPB        2 g 200 mL/hr over 30 Minutes Intravenous Every 24 hours 02/10/21 0826 02/11/21 1104   02/07/21 2200  doxycycline (VIBRAMYCIN) 100 mg in sodium chloride 0.9 % 250 mL IVPB        100 mg 125 mL/hr over 120 Minutes Intravenous Every 12 hours 02/07/21 2113 02/09/21 2310   02/07/21 1200  vancomycin (VANCOREADY) IVPB 1500 mg/300 mL  Status:  Discontinued        1,500 mg 150 mL/hr over 120 Minutes Intravenous Every 12 hours 02/06/21  2305 02/07/21 0833   02/07/21 1000  doxycycline (VIBRA-TABS) tablet 100 mg  Status:  Discontinued        100 mg Oral Every 12 hours 02/07/21 0833 02/07/21 2113   02/07/21 0930  cefTRIAXone (ROCEPHIN) 1 g in sodium chloride 0.9 % 100 mL IVPB  Status:  Discontinued        1 g 200 mL/hr over 30 Minutes Intravenous  Every 24 hours 02/07/21 0833 02/10/21 0826   02/06/21 2330  ceFEPIme (MAXIPIME) 2 g in sodium chloride 0.9 % 100 mL IVPB  Status:  Discontinued        2 g 200 mL/hr over 30 Minutes Intravenous Every 8 hours 02/06/21 2305 02/07/21 0833   02/06/21 2315  vancomycin (VANCOREADY) IVPB 2000 mg/400 mL        2,000 mg 200 mL/hr over 120 Minutes Intravenous NOW 02/06/21 2305 02/07/21 0409          Subjective: No new complaints  Objective: Vitals:   02/22/21 0024 02/22/21 0500 02/22/21 0843 02/22/21 0913  BP: 95/60 96/68 102/76 107/74  Pulse: (!) 112 (!) 108 (!) 118 (!) 116  Resp: 17 17 16    Temp: 97.7 F (36.5 C) 98.4 F (36.9 C) 97.7 F (36.5 C)   TempSrc: Oral Oral Oral   SpO2: 100% 98% 100% 100%  Weight:  90.4 kg    Height:        Intake/Output Summary (Last 24 hours) at 02/22/2021 1040 Last data filed at 02/22/2021 0028 Gross per 24 hour  Intake --  Output 300 ml  Net -300 ml   Filed Weights   02/20/21 0500 02/21/21 0318 02/22/21 0500  Weight: 92.4 kg 90.3 kg 90.4 kg    Examination:  General: No acute distress. Cardiovascular: Heart sounds show Ashritha Desrosiers regular rate, and rhythm.  Lungs: Clear to auscultation bilaterally Abdomen: Soft, nontender, nondistended  Neurological: Alert and oriented 3. Moves all extremities 4 . Cranial nerves II through XII grossly intact. Skin: Warm and dry. No rashes or lesions. Extremities: bilateral LE edema, compression stockings in place     Data Reviewed: I have personally reviewed following labs and imaging studies  CBC: Recent Labs  Lab 02/18/21 0511 02/19/21 0500 02/20/21 0440 02/21/21 0605 02/22/21 0639  WBC 20.8* 23.4* 19.0* 18.4* 17.6*  HGB 9.0* 7.3* 9.7* 9.7* 9.5*  HCT 29.0* 23.5* 30.0* 30.8* 30.3*  MCV 81.9 81.6 80.6 81.9 83.0  PLT 197 272 275 294 311    Basic Metabolic Panel: Recent Labs  Lab 02/18/21 0511 02/19/21 0500 02/20/21 0440 02/21/21 0605 02/22/21 0639  NA 137 133* 138 139 138  K 4.2 3.6 4.2 3.7  4.0  CL 99 98 108 107 107  CO2 28 27 23 24 24   GLUCOSE 97 97 89 95 95  BUN 19 28* 21* 23* 20  CREATININE 0.90 0.98 0.81 0.85 0.74  CALCIUM 8.2* 7.5* 8.0* 8.2* 8.0*  MG 1.9 2.0 2.0 1.9 1.9  PHOS  --   --   --   --  3.3    GFR: Estimated Creatinine Clearance: 110.1 mL/min (by C-G formula based on SCr of 0.74 mg/dL).  Liver Function Tests: Recent Labs  Lab 02/17/21 0409 02/18/21 0511 02/19/21 0500 02/20/21 0440 02/22/21 0639  AST 44* 56* 36 73* 71*  ALT 39 45* 40 51* 75*  ALKPHOS 60 66 61 68 81  BILITOT 1.9* 2.0* 1.8* 2.2* 1.6*  PROT 4.8* 5.3* 5.2* 5.3* 5.3*  ALBUMIN 1.8* 1.9* 1.8* 1.8* 1.9*  CBG: Recent Labs  Lab 02/21/21 0733 02/21/21 1129 02/21/21 1548 02/21/21 2125 02/22/21 0806  GLUCAP 98 101* 96 99 102*     Recent Results (from the past 240 hour(s))  SARS Coronavirus 2 by RT PCR (hospital order, performed in [facility] hospital lab) Nasopharyngeal Nasopharyngeal Swab     Status: None   Collection Time: 02/13/21  3:23 AM   Specimen: Nasopharyngeal Swab  Result Value Ref Range Status   SARS Coronavirus 2 NEGATIVE NEGATIVE Final    Comment: (NOTE) SARS-CoV-2 target nucleic acids are NOT DETECTED.  The SARS-CoV-2 RNA is generally detectable in upper and lower respiratory specimens during the acute phase of infection. The lowest concentration of SARS-CoV-2 viral copies this assay can detect is 250 copies / mL. Janalee Grobe negative result does not preclude SARS-CoV-2 infection and should not be used as the sole basis for treatment or other patient management decisions.  Mica Releford negative result may occur with improper specimen collection / handling, submission of specimen other than nasopharyngeal swab, presence of viral mutation(s) within the areas targeted by this assay, and inadequate number of viral copies (<250 copies / mL). Javonta Gronau negative result must be combined with clinical observations, patient history, and epidemiological information.  Fact Sheet for Patients:    BoilerBrush.com.cyhttps://www.fda.gov/media/136312/download  Fact Sheet for Healthcare Providers: https://pope.com/https://www.fda.gov/media/136313/download  This test is not yet approved or  cleared by the [place] FDA and has been authorized for detection and/or diagnosis of SARS-CoV-2 by FDA under an Emergency Use Authorization (EUA).  This EUA will remain in effect (meaning this test can be used) for the duration of the COVID-19 declaration under Section 564(b)(1) of the Act, 21 U.S.C. section 360bbb-3(b)(1), unless the authorization is terminated or revoked sooner.  Performed at [facility], [address] [place], [place] [address]   Expectorated Sputum Assessment w Gram Stain, Rflx to Resp Cult     Status: None   Collection Time: 02/15/21 11:49 AM   Specimen: Expectorated Sputum  Result Value Ref Range Status   Specimen Description EXPECTORATED SPUTUM  Final   Special Requests NONE  Final   Sputum evaluation   Final    THIS SPECIMEN IS ACCEPTABLE FOR SPUTUM CULTURE Performed at [facility], [address] [place], [place] [address]    Report Status 02/16/2021 FINAL  Final  Culture, Respiratory w Gram Stain     Status: None   Collection Time: 02/15/21 11:49 AM  Result Value Ref Range Status   Specimen Description EXPECTORATED SPUTUM  Final   Special Requests NONE Reflexed from [id number]  Final   Gram Stain   Final    ABUNDANT WBC PRESENT,BOTH PMN AND MONONUCLEAR RARE GRAM POSITIVE COCCI    Culture   Final    RARE Normal respiratory flora-no Staph aureus or Pseudomonas seen Performed at [facility], [address] [place], [place] [address]    Report Status 02/18/2021 FINAL  Final         Radiology Studies: DG Chest 2 View  Result Date: 02/20/2021 CLINICAL DATA:  Hemoptysis EXAM: CHEST - 2 VIEW COMPARISON:  02/16/2021 FINDINGS: Cardiac shadow is within normal limits. Right-sided PICC line is noted at the cavoatrial junction. Mild bibasilar airspace opacity is noted  right greater than left. No sizable effusion is seen. No bony abnormality is noted. IMPRESSION: PICC line as described. Bibasilar infiltrates right greater than left without focal effusion. Electronically Signed   By: Alcide CleverMark  Lukens M.D.   On: 02/20/2021 17:27  Scheduled Meds:  apixaban  10 mg Oral BID   Followed by   Melene Muller ON 02/23/2021] apixaban  5 mg Oral BID   carvedilol  3.125 mg Oral BID WC   Chlorhexidine Gluconate Cloth  6 each Topical Daily   dapagliflozin propanediol  10 mg Oral Daily   digoxin  0.125 mg Oral Daily   insulin aspart  0-5 Units Subcutaneous QHS   insulin aspart  0-9 Units Subcutaneous TID WC   levothyroxine  75 mcg Oral Q0600   magnesium oxide  400 mg Oral Daily   mouth rinse  15 mL Mouth Rinse BID   potassium chloride  20 mEq Oral Daily   sacubitril-valsartan  1 tablet Oral BID   sodium chloride flush  10-40 mL Intracatheter Q12H   sodium chloride flush  3 mL Intravenous Q12H   spironolactone  25 mg Oral Daily   torsemide  20 mg Oral Daily   Continuous Infusions:  sodium chloride 250 mL (02/15/21 0539)   sodium chloride 10 mL/hr at 02/13/21 1500   sodium chloride Stopped (02/14/21 1329)     LOS: 16 days    Time spent: over 30 min    Lacretia Nicks, MD [facility]   To contact the attending provider between 7A-7P or the covering provider during after hours 7P-7A, please log into the web site [email or web address] and access using universal [place] password for that web site. If you do not have the password, please call the hospital operator.  02/22/2021, 10:40 AM

## 2021-02-22 NOTE — Progress Notes (Addendum)
Patient ID: Debra Daniel, female   DOB: 12/24/1985, 35 y.o.   MRN: [id number]     Advanced Heart Failure Rounding Note  PCP-Cardiologist: Dr. Teressa Lower  Subjective:   -05/13: Seen in ED for recurrent N/V. Treated for possible UTI. Ongoing weakness, fatigue, intermittent N/V, cough. -06/27: ED at OSH with hemoptysis. Segmental PE and pneumonia. EF 25-30%. -06/28: Transferred to [facility]. Cardiogenic shock > required norepi and milrinone. Diuresed with IV lasix. -06/29: Tachycardic. Added digoxin and spiro. Amio added d/t NSVT.  -6/30: Midodrine started and lasix drip started.  -7/5: RHC/LHC No CAD   -7/5 Developed PMVT and required defibrillation x 2 with brief CPR.  QTc prolonged, amiodarone was stopped.  She received Mg and K. Milrinone decreased to 0.125.  She had another episode PMVT, Corlanor stopped. Now off lidocaine -07/08: Milrinone D/Cd  Fitted for LifeVest.   Co-ox 70%. CVP line removed.   Wt stable. No dyspnea. Hemoptysis improving, scant amount in tissue this am.    Cardiac MRI: 1.  Moderately dilated LV with EF 18%, diffuse hypokinesis. 2.  LV thrombus noted. 3.  Moderately dilated RV with EF 26%. 4.  MR looks mild by cMRI, regurgitant fraction 15%. 5. Subtle non-coronary mid-wall LGE pattern in the inferior and anterolateral walls, possible prior myocarditis. 6.  Consolidation of the right lung base.  Echo 02/06/2021: LVEF 10-15%, RV moderately enlarged with severely reduced systolic function, RVSP 45.9 mmHg, severe secondary MR, mod-severe TR.    Objective:   Weight Range: 90.4 kg Body mass index is 33.68 kg/m.   Vital Signs:   Temp:  [97.7 F (36.5 C)-98.4 F (36.9 C)] 98.4 F (36.9 C) (07/14 0500) Pulse Rate:  [82-112] 108 (07/14 0500) Resp:  [17-18] 17 (07/14 0500) BP: (93-96)/(60-72) 96/68 (07/14 0500) SpO2:  [98 %-100 %] 98 % (07/14 0500) Weight:  [90.4 kg] 90.4 kg (07/14 0500) Last BM Date: 02/20/21  Weight change: Filed Weights   02/20/21  0500 02/21/21 0318 02/22/21 0500  Weight: 92.4 kg 90.3 kg 90.4 kg    Intake/Output:   Intake/Output Summary (Last 24 hours) at 02/22/2021 0817 Last data filed at 02/22/2021 0028 Gross per 24 hour  Intake 240 ml  Output 300 ml  Net -60 ml      Physical Exam   CVP line removed General:  Young WF, appears stated age. Sitting up in chair.  No respiratory distress. HEENT: normal Neck: supple.  No JVD. Carotids 2+ bilat; no bruits. No lymphadenopathy or thyromegaly appreciated. Cor: PMI nondisplaced. Regular rhythm, tachycardic. No rubs, gallops or murmurs. Lungs: decreased BS at the bases bilaterally  Abdomen: obese, soft, nontender, nondistended. No hepatosplenomegaly. No bruits or masses. Good bowel sounds. Extremities: no cyanosis, clubbing, rash, 1-2+ bilateral LE edema L>R + TED hose Neuro: alert & oriented x 3, cranial nerves grossly intact. moves all 4 extremities w/o difficulty. Affect pleasant.   Telemetry   Sinus tach 100s-110s. Occasional PVCs. Short runs of NSVT 3-5 beats.  Labs    CBC Recent Labs    02/21/21 0605 02/22/21 0639  WBC 18.4* 17.6*  HGB 9.7* 9.5*  HCT 30.8* 30.3*  MCV 81.9 83.0  PLT 294 311   Basic Metabolic Panel Recent Labs    95/09/32 0605 02/22/21 0639  NA 139 138  K 3.7 4.0  CL 107 107  CO2 24 24  GLUCOSE 95 95  BUN 23* 20  CREATININE 0.85 0.74  CALCIUM 8.2* 8.0*  MG 1.9 1.9  PHOS  --  3.3  Liver Function Tests Recent Labs    02/20/21 0440 02/22/21 0639  AST 73* 71*  ALT 51* 75*  ALKPHOS 68 81  BILITOT 2.2* 1.6*  PROT 5.3* 5.3*  ALBUMIN 1.8* 1.9*    No results for input(s): LIPASE, AMYLASE in the last 72 hours. Cardiac Enzymes No results for input(s): CKTOTAL, CKMB, CKMBINDEX, TROPONINI in the last 72 hours.  BNP: BNP (last 3 results) Recent Labs    02/06/21 2018  BNP 1,571.8*    ProBNP (last 3 results) No results for input(s): PROBNP in the last 8760 hours.   D-Dimer No results for input(s): DDIMER in  the last 72 hours.  Hemoglobin A1C No results for input(s): HGBA1C in the last 72 hours.  Fasting Lipid Panel No results for input(s): CHOL, HDL, LDLCALC, TRIG, CHOLHDL, LDLDIRECT in the last 72 hours. Thyroid Function Tests No results for input(s): TSH, T4TOTAL, T3FREE, THYROIDAB in the last 72 hours.  Invalid input(s): FREET3   Other results:   Imaging    No results found.   Medications:     Scheduled Medications:  apixaban  10 mg Oral BID   Followed by   Melene Muller ON 02/23/2021] apixaban  5 mg Oral BID   carvedilol  3.125 mg Oral BID WC   Chlorhexidine Gluconate Cloth  6 each Topical Daily   dapagliflozin propanediol  10 mg Oral Daily   digoxin  0.125 mg Oral Daily   insulin aspart  0-5 Units Subcutaneous QHS   insulin aspart  0-9 Units Subcutaneous TID WC   levothyroxine  75 mcg Oral Q0600   magnesium oxide  400 mg Oral Daily   mouth rinse  15 mL Mouth Rinse BID   sacubitril-valsartan  1 tablet Oral BID   sodium chloride flush  10-40 mL Intracatheter Q12H   sodium chloride flush  3 mL Intravenous Q12H   spironolactone  25 mg Oral Daily   torsemide  20 mg Oral Daily    Infusions:  sodium chloride 250 mL (02/15/21 0539)   sodium chloride 10 mL/hr at 02/13/21 1500   sodium chloride Stopped (02/14/21 1329)    PRN Medications: sodium chloride, sodium chloride, acetaminophen, docusate sodium, lip balm, sodium chloride flush    Assessment/Plan   Acute systolic HF -> Cardiogenic shock/acute biventricular heart failure/new cardiomyopathy -New diagnosis of CHF this admission after presenting with hemoptysis at OSH in [place]. Suspected viral illness with > 1 month hx weakness, fatigue, cough, N/V. HIV negative. Recently diagnosed hypothyroidism.  Etiology => viral myocarditis versus familial cardiomyopathy (grandmother and aunts/uncles with CHF).   -No CAD on LHC - Echo here with EF of 10-15%, moderate to severely reduced RV fxn with mildly dilated RV, RVSP 45  mmHg, severe secondary MR, mod-severe TR. - cMRI with LV EF 18%, RV EF 26%, LV thrombus, Subtle non-coronary mid-wall LGE pattern in the inferior and anterolateral walls, possible prior myocarditis. - RHC with CI 2.48 on milrinone 0.25.  Now off milrinone and NE. Co-ox stable at 70%. - Appears compensated. C/w torsemide 20 mg daily. - No room to titrate meds further - BP 90s-100s systolic - Continue Entresto 24/26 mg BID. - Continue dapagliflozin 10 daily.  - Continue spironolactone 25 mg daily.   - Continue digoxin, level 0.4 recently.  - Started coreg 3.125 mg BID 07/13.  - Off ivabradine with PMVT.  - Overall picture concerning. 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  She is someone we may need to consider advanced therapies for, however RV dysfunction precludes VAD. BMI (35) is  borderline for transplant - Fitted for LifeVest - Cardiac rehab.   2. Mitral valve regurgitation - Severe on echo this admission, only looked mild on cMRI however.  - Likely secondary, valve does not look abnormal on echo.   3. Moderate-severe TR - Likely secondary - Due to RV dilation (functional)  4. NSVT => episodes of PMVT - She had been on amiodarone gtt for NSVT, prolonged QTc and developed PMVT.  Now off amiodarone, Corlanor, and milrinone.  Transiently on lidocaine.   - Several brief episodes NSVT last few days - Keep K > 4. - Keep Mg > 2.  - Has been fitted for LifeVest  5. Hemoptysis/RLL pneumonia - Possible PE noted on imaging at OSH. Suspect course of events likely viral illness/myocarditis > inactivity, followed by PE and pneumonia. Venous duplex negative for DVT. - Suspect hemoptysis d/t elevated left atrial pressure, PNA and PE.  - CT chest with consolidation both lower lobes. Ground glass opacities right middle lobe. - WBC remains elevated but improving, 23>>19>>18K>>17 - With ongoing leukocytosis and dense right base consolidation on cMRI as well as green sputum and PCT 0.41. MRSA nasal swab was negative,  but GPCs noted on sputum culture. Completed 5 days antibiotics with vanc/cefepime  6. RLL PE - Seen on imaging at [facility] (CTA chest).  - On apixaban now.   6. Hypothyroidism -Recently diagnosed. -TSH 13.5/Free T4 1.39. -On synthroid   7. Elevated INR - Likely due to passive congestion from CHF. - Given Vit K 6/29, 6/30, and 7/1 per CCM.  8. Hyponatremia - Resolved.  - Restrict free water  9. Acute blood loss anemia - Presented with massive hemoptysis which improved.  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 LV thrombus - On apixaban   Discharge planning:  Okay for discharge from HF standpoint.  Has f/u scheduled in AHF clinic on 07/22 at 10 am  - w/ ECG/BMP/CBC/magnesium level  Not currently insured. Working with CSW to obtain Medicaid.  CPhT helping with patient assistance applications for Clifton Custard and Eliquis.  Recommend medications go through [facility] pharmacy.  Meds at Discharge: Coreg 3.125 mg BID Farxiga 10 mg daily Digoxin 0.125 mg daily Mag-Ox 400 mg daily Entresto 24/26 mg daily Spironolactone 25 mg daily Torsemide 20 mg daily Potassium 20 mEq daily Eliquis 10 mg BID, 5 mg BID starting 07/15    FINCH, LINDSAY N, PA-C  8:17 AM 02/22/2021  Patient seen and examined with the above-signed Advanced Practice Provider and/or Housestaff. I personally reviewed laboratory data, imaging studies and relevant notes. I independently examined the patient and formulated the important aspects of the plan. I have edited the note to reflect any of my changes or salient points. I have personally discussed the plan with the patient and/or family.  She is improved. Able to ambulate. Still with very mild hemoptysis.   General:  Sitting up in bed No resp difficulty HEENT: normal Neck: supple. no JVD. Carotids 2+ bilat; no bruits. No lymphadenopathy or  thryomegaly appreciated. Cor: PMI nondisplaced. Tachy regular . Lungs: clear Abdomen: obese soft, nontender, nondistended. No hepatosplenomegaly. No bruits or masses. Good bowel sounds. Extremities: no cyanosis, clubbing, rash, edema Neuro: alert & orientedx3, cranial nerves grossly intact. moves all 4 extremities w/o difficulty. Affect pleasant  I think she is ok for d/c today with LifeVest and very close f/u in the  HF Clinic.   D/c meds as above  Arvilla Meres, MD  12:02 PM

## 2021-02-23 ENCOUNTER — Other Ambulatory Visit (HOSPITAL_COMMUNITY): Payer: Self-pay

## 2021-02-27 NOTE — Telephone Encounter (Signed)
Sent in applications via fax.  Will follow up.  

## 2021-03-01 ENCOUNTER — Other Ambulatory Visit (HOSPITAL_COMMUNITY): Payer: Self-pay

## 2021-03-01 ENCOUNTER — Telehealth (HOSPITAL_COMMUNITY): Payer: Self-pay

## 2021-03-01 NOTE — Progress Notes (Addendum)
Advanced Heart Failure Clinic Note    PCP: [facility] Dept. PCP-Cardiologist: None  HF Cardiologist: Dr. Haroldine Laws  HPI: Debra Daniel is a 35 y.o. WF with no significant medical history until recently. Newly diagnosed with systolic heart failure, nonischemic cardiomyopathy, DM II, hypothyroidism, LV thrombus & PE on Eliquis.    Presented to the ED in [facility] 02/05/21 w/ hemoptysis.  Previously treated for possible UTI with IV fluids ad abx. CTA chest abdomen pelvis with acute RLL segmental and subsegmental PE, consolidation in bilateral lower lobes, diffuse anasarca, no definite pathology in abdomen and pelvis. OCPs were stopped. Echo showed EF of 25-30%, moderate to severe MR, RVSP 45 mmHg, moderate TR. She was transferred to [facility] for additional pulmonary and cardiac evaluation.   Upon arrival to [facility], stat echo demonstrated LVEF of 10-15% with moderately dilated LV, severe secondary MR, moderate to severe RV dysfunction with moderately enlarged RV, RVSP 45 mmHg, severe TR. She developed cardiogenic shock and was placed on milrinone, and briefly on low-dose NE. She was diuresed with IV Lasix. She was started on amiodarone for NSVT. She underwent L/RHC (7/5) showing no CAD. She developed PMVT requiring shock x 2 and brief CPR. QTc long, amio stopped. Sustained another episode of PMVT, ivabradine stopped. She was transitioned to oral diuretics and GDMT initiated. Hospitalization c/b elevated LFTs, anemia, and PNA. She was fitted for LifeVest. Discharge weight 199 lbs.   Today she returns for post hospitalization HF follow up. Overall feeling fine, a little fatigued. She is not exerting herself but not SOB walking on flat surface. Denies increasing SOB, CP, dizziness, edema, or PND/Orthopnea. Appetite ok. No fever or chills. Weight at home 188 pounds. Taking all medications. Not currently insured. Working with CSW to obtain Medicaid. She has established with PCP at health department in  [place].  Review of Systems: [y] = yes, [ ] = no   General: Weight gain [ ]; Weight loss [y]; Anorexia [ ]; Fatigue [y]; Fever [ ]; Chills [ ]; Weakness [ ]  Cardiac: Chest pain/pressure [ ]; Resting SOB [ ]; Exertional SOB Blue.Reese ]; Orthopnea [ ]; Pedal Edema [ ]; Palpitations [ ]; Syncope [ ]; Presyncope [ ]; Paroxysmal nocturnal dyspnea[ ]  Pulmonary: Cough [y]; Wheezing[ ]; Hemoptysis[ ]; Sputum [ ]; Snoring [ ]  GI: Vomiting[ ]; Dysphagia[ ]; Melena[ ]; Hematochezia [ ]; Heartburn[ ]; Abdominal pain [ ]; Constipation [ ]; Diarrhea [ ]; BRBPR [ ]  GU: Hematuria[ ]; Dysuria [ ]; Nocturia[ ]  Vascular: Pain in legs with walking [ ]; Pain in feet with lying flat [ ]; Non-healing sores [ ]; Stroke [ ]; TIA [ ]; Slurred speech [ ];  Neuro: Headaches[ ]; Vertigo[ ]; Seizures[ ]; Paresthesias[ ];Blurred vision [ ]; Diplopia [ ]; Vision changes [ ]  Ortho/Skin: Arthritis [ ]; Joint pain [ ]; Muscle pain [ ]; Joint swelling [ ]; Back Pain [ ]; Rash [ ]  Psych: Depression[ ]; Anxiety[ ]  Heme: Bleeding problems [ ]; Clotting disorders [ ]; Anemia [ ]  Endocrine: Diabetes [ ]; Thyroid dysfunction[ ]  Cardiac Studies: - Echo (6/22): EF 10-15%, RV moderately enlarged with severely reduced systolic function, RVSP 27.7 mmHg, severe secondary MR, mod-severe TR.  - R/LHC (7/22): No CAD, with CI 2.48 on milrinone 0.25. Mildly elevated filling pressures, pulmonary venous hypertension.  RHC Procedural Findings (on milrinone 0.25): Hemodynamics (mmHg) RA mean 9 RV 52/17 PA 53/17, mean 31 PCWP mean 16 LV 90/14 AO 90/63  Oxygen saturations: PA 57% AO 98%  Cardiac Output (Fick) 5.1  Cardiac Index (Fick) 2.48  PVR 2.9 WU   - cMRI (7/22): LVEF 18%, diffuse hypokinesis, LV thrombus, mod dilated RV with EF 15%, mild MR, subtle non-coronary mid-wall LGE pattern in the inferior and anterolateral walls, possible prior myocarditis.  Past Medical History:  Diagnosis Date   Encounter for menstrual  regulation 01/24/2015   Hypothyroid    Migraines    Obesity    UTI (urinary tract infection)    Current Outpatient Medications  Medication Sig Dispense Refill   Accu-Chek Softclix Lancets lancets Use as directed up to 4 times daily 100 each 5   acetaminophen (TYLENOL) 500 MG tablet Take 1,000 mg by mouth every 6 (six) hours as needed for mild pain.     apixaban (ELIQUIS) 5 MG TABS tablet Take 1 tablet (5 mg total) by mouth 2 (two) times daily. 60 tablet 1   Blood Glucose Monitoring Suppl (ACCU-CHEK GUIDE) w/Device KIT Use as directed 1 kit 0   carvedilol (COREG) 3.125 MG tablet Take 1 tablet (3.125 mg total) by mouth 2 (two) times daily with a meal. 60 tablet 1   dapagliflozin propanediol (FARXIGA) 10 MG TABS tablet Take 1 tablet (10 mg total) by mouth daily. 30 tablet 1   digoxin (LANOXIN) 0.125 MG tablet Take 1 tablet (0.125 mg total) by mouth daily. 30 tablet 1   glucose blood (ACCU-CHEK GUIDE) test strip Use as instructed up to 4 times daily 100 each 12   levothyroxine (SYNTHROID) 75 MCG tablet Take 75 mcg by mouth daily.     magnesium oxide (MAG-OX) 400 MG tablet Take 1 tablet (400 mg total) by mouth daily. 30 tablet 1   metFORMIN (GLUCOPHAGE) 500 MG tablet Take 1 tablet (500 mg total) by mouth daily with breakfast. 30 tablet 1   potassium chloride SA (KLOR-CON) 20 MEQ tablet Take 1 tablet (20 mEq total) by mouth daily. 30 tablet 1   promethazine (PHENERGAN) 25 MG tablet Take 1 tablet (25 mg total) by mouth every 6 (six) hours as needed for nausea or vomiting. 30 tablet 0   sacubitril-valsartan (ENTRESTO) 24-26 MG Take 1 tablet by mouth 2 (two) times daily. 60 tablet 1   spironolactone (ALDACTONE) 25 MG tablet Take 1 tablet (25 mg total) by mouth daily. 30 tablet 1   torsemide (DEMADEX) 20 MG tablet Take 1 tablet (20 mg total) by mouth daily. 30 tablet 1   No current facility-administered medications for this encounter.   Allergies  Allergen Reactions   Heparin Anaphylaxis    HIT  antibody positive 02/14/21; SRA + 7/8   Sulfa Antibiotics Hives   Ibuprofen Other (See Comments)    Constipation Can tolerate w milk of mag    Social History   Socioeconomic History   Marital status: Single    Spouse name: Not on file   Number of children: Not on file   Years of education: Not on file   Highest education level: Not on file  Occupational History   Not on file  Tobacco Use   Smoking status: Never   Smokeless tobacco: Never  Vaping Use   Vaping Use: Never used  Substance and Sexual Activity   Alcohol use: No   Drug use: No   Sexual activity: Never    Birth control/protection: Pill  Other Topics Concern   Not on file  Social History Narrative   Not on file   Social Determinants of Health  Financial Resource Strain: Medium Risk   Difficulty of Paying Living Expenses: Somewhat hard  Food Insecurity: No Food Insecurity   Worried About Charity fundraiser in the Last Year: Never true   Ran Out of Food in the Last Year: Never true  Transportation Needs: No Transportation Needs   Lack of Transportation (Medical): No   Lack of Transportation (Non-Medical): No  Physical Activity: Insufficiently Active   Days of Exercise per Week: 2 days   Minutes of Exercise per Session: 30 min  Stress: No Stress Concern Present   Feeling of Stress : Not at all  Social Connections: Socially Isolated   Frequency of Communication with Friends and Family: More than three times a week   Frequency of Social Gatherings with Friends and Family: More than three times a week   Attends Religious Services: Never   Marine scientist or Organizations: No   Attends Music therapist: Never   Marital Status: Never married  Human resources officer Violence: Not At Risk   Fear of Current or Ex-Partner: No   Emotionally Abused: No   Physically Abused: No   Sexually Abused: No   Family History  Problem Relation Age of Onset   Hypertension Paternal Grandmother    Coronary  artery disease Paternal Grandmother    Hypertension Maternal Grandmother    Diabetes Maternal Grandmother    Hyperlipidemia Maternal Grandmother    Thyroid disease Maternal Grandmother    Kidney disease Maternal Grandmother    Diabetes Father    Hypertension Mother    Hyperlipidemia Mother    COPD Paternal Grandfather    Heart disease Maternal Grandfather    Diabetes Maternal Grandfather    Hypertension Brother    Hyperlipidemia Brother    BP 90/60   Pulse (!) 101   Wt 86.5 kg   LMP 02/17/2021 (Exact Date) Comment: Pt sts on 3-mo BC pills 02/06/21  SpO2 98%   BMI 32.24 kg/m   Wt Readings from Last 3 Encounters:  03/02/21 86.5 kg  02/22/21 90.4 kg  12/22/20 97.2 kg   PHYSICAL EXAM: General:  NAD. No resp difficulty, pale HEENT: Normal Neck: Supple. No JVD. Carotids 2+ bilat; no bruits. No lymphadenopathy or thryomegaly appreciated. Cor: PMI nondisplaced. Regular rate & rhythm. No rubs, gallops or murmurs. Lungs: Clear Abdomen: Obese, nontender, nondistended. No hepatosplenomegaly. No bruits or masses. Good bowel sounds. Extremities: No cyanosis, clubbing, rash, left ankle edema Neuro: Alert & oriented x 3, cranial nerves grossly intact. Moves all 4 extremities w/o difficulty. Affect pleasant.  ECG: ST 102, qrs 98 ms (personally reviewed).  LifeVest Interrogation: unable to interrogate today, patient reports no issues.  ASSESSMENT & PLAN: Chronic systolic HF  - New diagnosis of CHF this admission. Suspected viral illness. ? viral myocarditis vs familial cardiomyopathy (grandmother and aunts/uncles with CHF).   - Echo (6/22): EF of 10-15%, moderate to severely reduced RV fxn with mildly dilated RV, RVSP 45 mmHg, severe secondary MR, mod-severe TR. - cMRI (7/22): LV EF 18%, RV EF 26%, LV thrombus, Subtle non-coronary mid-wall LGE pattern in the inferior and anterolateral walls, possible prior myocarditis. - R/LHC (7/22): No CAD, with CI 2.48 on milrinone 0.25.   - NYHA II,  volume ok today. - Increase carvedilol to 6.25 mg bid. HR 101 today. - Decrease spironolactone to 12.5 mg daily (soft BP and increasing beta blocker). - Continue torsemide 20 mg daily. - Continue Entresto 24/26 mg bid. - Continue dapagliflozin 10 daily. - Continue digoxin 0.125  mg daily. - Off ivabradine with PMVT. - Overall picture concerning. 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  She is someone we may need to consider advanced therapies for, however RV dysfunction precludes VAD. BMI (35) is borderline for transplant. - Fitted for LifeVest. - She is not in CR. - BMET and digoxin level.   2. Mitral valve regurgitation - Severe on echo this admission, only looked mild on cMRI however. - Likely secondary, valve does not look abnormal on echo. - She does not have an obvious murmur on exam   3. Moderate-severe TR - Likely secondary. - Due to RV dilation (functional).   4. NSVT  - She had been on amiodarone gtt for NSVT, prolonged QTc and developed PMVT.   - She is off amiodarone and ivabradine. - LifeVest on. - Check magnesium today.  5. RLL PE - Seen on imaging at [facility] (CTA chest). - On apixaban now.   6. LV thrombus - On apixaban. No bleeding. - CBC today.  Follow up in 3 weeks with APP.   [place], FNP 03/02/21

## 2021-03-01 NOTE — Telephone Encounter (Signed)
  Pharmacy Transitions of Care Follow-up Telephone Call  Date of discharge: 02/22/21  Discharge Diagnosis: Type 2 Diabetes  How have you been since you were released from the hospital?  Patient has been doing well since discharge, no questions about medications at this time.  Medication changes made at discharge:      START taking: Accu-Chek Guide (glucose blood)  Accu-Chek Guide  Accu-Chek Softclix Lancets  carvedilol (COREG)  digoxin (LANOXIN)  Eliquis (apixaban)  Entresto (sacubitril-valsartan)  Farxiga (dapagliflozin propanediol)  magnesium oxide (MAG-OX)  metFORMIN (Glucophage)  potassium chloride SA (KLOR-CON)  spironolactone (ALDACTONE)  torsemide (DEMADEX)   STOP taking: amoxicillin-clavulanate 875-125 MG tablet (AUGMENTIN)  Daysee 0.15-0.03 &0.01 MG tablet (Levonorgestrel-Ethinyl Estradiol)  nitrofurantoin (macrocrystal-monohydrate) 100 MG capsule (MACROBID)  ondansetron 4 MG tablet (ZOFRAN)    Medication changes verified by the patient? Yes    Medication Accessibility:  Home Pharmacy:  not discussed, patient does not want transfers sent anywhere at this time  Was the patient provided with refills on discharged medications? Yes   Have all prescriptions been transferred from [facility] to home pharmacy?  No  Is the patient able to afford medications? Discharged on MATCH    Medication Review:   APIXABAN (ELIQUIS)  Apixaban 5 mg BID initiated on 02/23/21.  - Discussed importance of taking medication around the same time everyday  - Advised patient of medications to avoid (NSAIDs, ASA)  - Educated that Tylenol (acetaminophen) will be the preferred analgesic to prevent risk of bleeding  - Emphasized importance of monitoring for signs and symptoms of bleeding (abnormal bruising, prolonged bleeding, nose bleeds, bleeding from gums, discolored urine, black tarry stools)  - Advised patient to alert all providers of anticoagulation therapy prior to starting a new medication  or having a procedure    Follow-up Appointments:  PCP Hospital f/u appt confirmed? Saw PCP on 02/27/21  Specialist Hospital f/u appt confirmed? Yes Scheduled to see Cardiology on 03/02/21 If their condition worsens, is the pt aware to call PCP or go to the Emergency Dept.? Yes  Final Patient Assessment: Patient doing well. Knows to get transfers from our pharmacy, has follow up scheduled.

## 2021-03-02 ENCOUNTER — Other Ambulatory Visit: Payer: Self-pay

## 2021-03-02 ENCOUNTER — Other Ambulatory Visit (HOSPITAL_COMMUNITY): Payer: Self-pay

## 2021-03-02 ENCOUNTER — Other Ambulatory Visit (HOSPITAL_COMMUNITY): Payer: Self-pay | Admitting: Family Medicine

## 2021-03-02 ENCOUNTER — Encounter (HOSPITAL_COMMUNITY): Payer: Self-pay

## 2021-03-02 ENCOUNTER — Ambulatory Visit (HOSPITAL_COMMUNITY)
Admit: 2021-03-02 | Discharge: 2021-03-02 | Disposition: A | Discharge status: Home / Self Care | Payer: Medicaid Other | Source: Ambulatory Visit | Attending: Family Medicine | Admitting: Family Medicine

## 2021-03-02 ENCOUNTER — Telehealth (HOSPITAL_COMMUNITY): Payer: Self-pay | Admitting: Family Medicine

## 2021-03-02 VITALS — BP 90/60 | HR 101 | Wt 190.8 lb

## 2021-03-02 DIAGNOSIS — I071 Rheumatic tricuspid insufficiency: Secondary | ICD-10-CM | POA: Diagnosis not present

## 2021-03-02 DIAGNOSIS — I340 Nonrheumatic mitral (valve) insufficiency: Secondary | ICD-10-CM

## 2021-03-02 DIAGNOSIS — I472 Ventricular tachycardia: Secondary | ICD-10-CM | POA: Diagnosis not present

## 2021-03-02 DIAGNOSIS — Z6835 Body mass index (BMI) 35.0-35.9, adult: Secondary | ICD-10-CM | POA: Diagnosis not present

## 2021-03-02 DIAGNOSIS — I2699 Other pulmonary embolism without acute cor pulmonale: Secondary | ICD-10-CM | POA: Insufficient documentation

## 2021-03-02 DIAGNOSIS — I428 Other cardiomyopathies: Secondary | ICD-10-CM | POA: Diagnosis not present

## 2021-03-02 DIAGNOSIS — I513 Intracardiac thrombosis, not elsewhere classified: Secondary | ICD-10-CM | POA: Insufficient documentation

## 2021-03-02 DIAGNOSIS — I5022 Chronic systolic (congestive) heart failure: Secondary | ICD-10-CM | POA: Diagnosis not present

## 2021-03-02 DIAGNOSIS — Z8249 Family history of ischemic heart disease and other diseases of the circulatory system: Secondary | ICD-10-CM | POA: Insufficient documentation

## 2021-03-02 DIAGNOSIS — Z79890 Hormone replacement therapy (postmenopausal): Secondary | ICD-10-CM | POA: Diagnosis not present

## 2021-03-02 DIAGNOSIS — Z833 Family history of diabetes mellitus: Secondary | ICD-10-CM | POA: Insufficient documentation

## 2021-03-02 DIAGNOSIS — Z596 Low income: Secondary | ICD-10-CM | POA: Diagnosis not present

## 2021-03-02 DIAGNOSIS — I081 Rheumatic disorders of both mitral and tricuspid valves: Secondary | ICD-10-CM | POA: Insufficient documentation

## 2021-03-02 DIAGNOSIS — E039 Hypothyroidism, unspecified: Secondary | ICD-10-CM | POA: Insufficient documentation

## 2021-03-02 DIAGNOSIS — E119 Type 2 diabetes mellitus without complications: Secondary | ICD-10-CM | POA: Insufficient documentation

## 2021-03-02 DIAGNOSIS — Z8349 Family history of other endocrine, nutritional and metabolic diseases: Secondary | ICD-10-CM | POA: Insufficient documentation

## 2021-03-02 DIAGNOSIS — Z79899 Other long term (current) drug therapy: Secondary | ICD-10-CM | POA: Insufficient documentation

## 2021-03-02 DIAGNOSIS — Z7901 Long term (current) use of anticoagulants: Secondary | ICD-10-CM | POA: Insufficient documentation

## 2021-03-02 DIAGNOSIS — Z7984 Long term (current) use of oral hypoglycemic drugs: Secondary | ICD-10-CM | POA: Diagnosis not present

## 2021-03-02 DIAGNOSIS — I4729 Other ventricular tachycardia: Secondary | ICD-10-CM

## 2021-03-02 LAB — BASIC METABOLIC PANEL
Anion gap: 11 (ref 5–15)
BUN: 14 mg/dL (ref 6–20)
CO2: 20 mmol/L — ABNORMAL LOW (ref 22–32)
Calcium: 9 mg/dL (ref 8.9–10.3)
Chloride: 105 mmol/L (ref 98–111)
Creatinine, Ser: 0.75 mg/dL (ref 0.44–1.00)
GFR, Estimated: >60 mL/min (ref >60)
Glucose, Bld: 84 mg/dL (ref 70–99)
Potassium: 5 mmol/L (ref 3.5–5.1)
Sodium: 136 mmol/L (ref 135–145)

## 2021-03-02 LAB — CBC
HCT: 43.4 % (ref 36.0–46.0)
Hemoglobin: 12 g/dL (ref 12.0–15.0)
MCH: 25.4 pg — ABNORMAL LOW (ref 26.0–34.0)
MCHC: 27.6 g/dL — ABNORMAL LOW (ref 30.0–36.0)
MCV: 91.8 fL (ref 80.0–100.0)
Platelets: 499 10*3/uL — ABNORMAL HIGH (ref 150–400)
RBC: 4.73 MIL/uL (ref 3.87–5.11)
RDW: 20.1 % — ABNORMAL HIGH (ref 11.5–15.5)
WBC: 12.5 10*3/uL — ABNORMAL HIGH (ref 4.0–10.5)
nRBC: 0 % (ref 0.0–0.2)

## 2021-03-02 LAB — DIGOXIN LEVEL: Digoxin Level: 0.9 ng/mL (ref 0.8–2.0)

## 2021-03-02 LAB — MAGNESIUM: Magnesium: 2.2 mg/dL (ref 1.7–2.4)

## 2021-03-02 MED ORDER — CARVEDILOL 6.25 MG PO TABS
6.2500 mg | ORAL_TABLET | Freq: Two times a day (BID) | ORAL | 11 refills | Status: DC
  Filled 2021-03-02: qty 60, 30d supply, fill #0

## 2021-03-02 MED ORDER — SPIRONOLACTONE 25 MG PO TABS
12.5000 mg | ORAL_TABLET | Freq: Every day | ORAL | 11 refills | Status: DC
  Filled 2021-03-02: qty 15, 30d supply, fill #0
  Filled 2021-03-23: qty 15, 30d supply, fill #1
  Filled 2021-04-19: qty 15, 30d supply, fill #2

## 2021-03-02 MED ORDER — DIGOXIN 125 MCG PO TABS: 0.0625 mg | ORAL_TABLET | Freq: Every day | ORAL | 1 refills | Status: DC

## 2021-03-02 NOTE — Telephone Encounter (Signed)
Spoke with patient regarding lab results and decreasing digoxin dose. She verbalized understanding and is agreeable with plan.   Prince Rome, FNP-BC

## 2021-03-02 NOTE — Patient Instructions (Addendum)
EKG done today.  Labs done today. We will contact you only if your labs are abnormal.  INCREASE Carvedilol to 6.25mg  (1 tablet) by mouth 2 times daily.  DECREASE Spironolactone to 12.5mg  (1/2 tablet) by mouth daily.   No other medication changes were made. Please continue all current medications as prescribed.  Your physician recommends that you schedule a follow-up appointment in: 3 weeks and in 6 weeks with our APP Clinic here in our office.   If you have any questions or concerns before your next appointment please send [place] a message through [place] or call our office at [phone].    TO LEAVE A MESSAGE FOR THE NURSE SELECT OPTION 2, PLEASE LEAVE A MESSAGE INCLUDING: YOUR NAME DATE OF BIRTH CALL BACK NUMBER REASON FOR CALL**this is important as we prioritize the call backs  YOU WILL RECEIVE A CALL BACK THE SAME DAY AS LONG AS YOU CALL BEFORE 4:00 PM   Do the following things EVERYDAY: Weigh yourself in the morning before breakfast. Write it down and keep it in a log. Take your medicines as prescribed Eat low salt foods--Limit salt (sodium) to 2000 mg per day.  Stay as active as you can everyday Limit all fluids for the day to less than 2 liters   At the [facility], you and your health needs are our priority. As part of our continuing mission to provide you with exceptional heart care, we have created designated Provider Care Teams. These Care Teams include your primary Cardiologist (physician) and Advanced Practice Providers (APPs- Physician Assistants and Nurse Practitioners) who all work together to provide you with the care you need, when you need it.   You may see any of the following providers on your designated Care Team at your next follow up: Dr Arvilla Meres Dr Carron Curie, NP Robbie Lis, [place] Karle Plumber, PharmD   Please be sure to bring in all your medications bottles to every appointment.

## 2021-03-02 NOTE — Progress Notes (Signed)
d 

## 2021-03-05 ENCOUNTER — Other Ambulatory Visit (HOSPITAL_COMMUNITY): Payer: Self-pay

## 2021-03-05 ENCOUNTER — Other Ambulatory Visit (HOSPITAL_COMMUNITY): Payer: Self-pay | Admitting: Cardiology

## 2021-03-05 MED ORDER — TORSEMIDE 20 MG PO TABS
20.0000 mg | ORAL_TABLET | Freq: Every day | ORAL | 2 refills | Status: DC
  Filled 2021-03-05 – 2021-03-23 (×2): qty 30, 30d supply, fill #0
  Filled 2021-04-19: qty 30, 30d supply, fill #1

## 2021-03-05 MED ORDER — DIGOXIN 125 MCG PO TABS
0.0625 mg | ORAL_TABLET | Freq: Every day | ORAL | 2 refills | Status: DC
  Filled 2021-03-05: qty 30, 60d supply, fill #0
  Filled 2021-03-23: qty 15, 30d supply, fill #0
  Filled 2021-04-19: qty 15, 30d supply, fill #1

## 2021-03-05 MED ORDER — POTASSIUM CHLORIDE CRYS ER 20 MEQ PO TBCR
20.0000 meq | EXTENDED_RELEASE_TABLET | Freq: Every day | ORAL | 2 refills | Status: DC
  Filled 2021-03-05 – 2021-03-23 (×2): qty 30, 30d supply, fill #0
  Filled 2021-04-19: qty 30, 30d supply, fill #1

## 2021-03-06 NOTE — Addendum Note (Signed)
Encounter addended by: Jacklynn Ganong, FNP on: 03/06/2021 2:02 AM  Actions taken: Clinical Note Signed

## 2021-03-07 NOTE — Telephone Encounter (Addendum)
Advanced Heart Failure Patient Advocate Encounter   Patient was approved to receive Entresto from [employer]  Patient ID: [id number] Effective dates: 03/01/21 through 03/01/22  Patient was approved to receive Farxiga from [employer]  Patient ID: [id number] Effective dates: 02/28/21 through 02/27/22  Patient was approved to receive Eliquis from [employer]  Patient ID: [id number] Effective dates: 02/28/21 through 02/27/22  Called and spoke with the patient.   Archer Asa, CPhT

## 2021-03-16 ENCOUNTER — Other Ambulatory Visit (HOSPITAL_COMMUNITY): Payer: Self-pay

## 2021-03-22 NOTE — Progress Notes (Addendum)
Advanced Heart Failure Clinic Note    PCP: [facility] Dept. PCP-Cardiologist: None  HF Cardiologist: Dr. Haroldine Laws  HPI: Debra Daniel is a 35 y.o. WF with no significant medical history until recently. Newly diagnosed with systolic heart failure, nonischemic cardiomyopathy, DM II, hypothyroidism, LV thrombus & PE on Eliquis.    Presented to the ED in [facility] 02/05/21 w/ hemoptysis.  CTA chest abdomen pelvis with acute RLL segmental and subsegmental PE, consolidation in bilateral lower lobes, diffuse anasarca. OCPs were stopped. Echo showed EF of 25-30%, moderate to severe MR, RVSP 45 mmHg, moderate TR. She was transferred to [facility] for additional pulmonary and cardiac evaluation.   Stat echo at [facility] showed LVEF of 10-15% with moderately dilated LV, severe secondary MR, moderate to severe RV dysfunction with moderately enlarged RV, RVSP 45 mmHg, severe TR. She developed cardiogenic shock and was placed on milrinone, and briefly on low-dose NE. She was diuresed with IV Lasix. She was started on amiodarone for NSVT. She underwent L/RHC (7/5) showing no CAD. She developed PMVT requiring shock x 2 and brief CPR. QTc long, amio stopped. Sustained another episode of PMVT, ivabradine stopped. She was transitioned to oral diuretics and GDMT initiated. Hospitalization c/b elevated LFTs, anemia, and PNA. She was fitted for LifeVest. Discharge weight 199 lbs.    Today she returns for HF follow up with her mother. Overall feeling fine says she does not feel fatigued. She says she is not SOB walking on flat ground, has not been walking much or been up stairs. Denies CP, dizziness, edema, or PND/Orthopnea. Appetite ok. No fever or chills. Weight at home 183 pounds. Taking all medications. Ate out once this past week. Not currently insured, Medicaid pending. She has established with PCP at health department in [place].  Cardiac Studies: - Echo (6/22): EF 10-15%, RV moderately enlarged with severely  reduced systolic function, RVSP 29.9 mmHg, severe secondary MR, mod-severe TR.  - R/LHC (7/22): No CAD, with CI 2.48 on milrinone 0.25. Mildly elevated filling pressures, pulmonary venous hypertension.  RHC Procedural Findings (on milrinone 0.25): Hemodynamics (mmHg) RA mean 9 RV 52/17 PA 53/17, mean 31 PCWP mean 16 LV 90/14 AO 90/63  Oxygen saturations: PA 57% AO 98%  Cardiac Output (Fick) 5.1  Cardiac Index (Fick) 2.48  PVR 2.9 WU   - cMRI (7/22): LVEF 18%, diffuse hypokinesis, LV thrombus, mod dilated RV with EF 15%, mild MR, subtle non-coronary mid-wall LGE pattern in the inferior and anterolateral walls, possible prior myocarditis.  Past Medical History:  Diagnosis Date   Encounter for menstrual regulation 01/24/2015   Hypothyroid    Migraines    Obesity    UTI (urinary tract infection)    Current Outpatient Medications  Medication Sig Dispense Refill   Accu-Chek Softclix Lancets lancets Use as directed up to 4 times daily 100 each 5   acetaminophen (TYLENOL) 500 MG tablet Take 1,000 mg by mouth every 6 (six) hours as needed for mild pain.     apixaban (ELIQUIS) 5 MG TABS tablet Take 1 tablet (5 mg total) by mouth 2 (two) times daily. 60 tablet 1   Blood Glucose Monitoring Suppl (ACCU-CHEK GUIDE) w/Device KIT Use as directed 1 kit 0   dapagliflozin propanediol (FARXIGA) 10 MG TABS tablet Take 1 tablet (10 mg total) by mouth daily. 30 tablet 1   digoxin (LANOXIN) 0.125 MG tablet Take 0.5 tablets (0.0625 mg total) by mouth daily. 30 tablet 2   glucose blood (ACCU-CHEK GUIDE) test strip Use as instructed  up to 4 times daily 100 each 12   levothyroxine (SYNTHROID) 75 MCG tablet Take 75 mcg by mouth daily.     magnesium oxide (MAG-OX) 400 MG tablet Take 1 tablet (400 mg total) by mouth daily. 30 tablet 1   metFORMIN (GLUCOPHAGE) 500 MG tablet Take 1 tablet (500 mg total) by mouth daily with breakfast. 30 tablet 1   potassium chloride SA (KLOR-CON) 20 MEQ tablet Take 1 tablet  (20 mEq total) by mouth daily. 30 tablet 2   promethazine (PHENERGAN) 25 MG tablet Take 1 tablet (25 mg total) by mouth every 6 (six) hours as needed for nausea or vomiting. 30 tablet 0   sacubitril-valsartan (ENTRESTO) 24-26 MG Take 1 tablet by mouth 2 (two) times daily. 60 tablet 1   spironolactone (ALDACTONE) 25 MG tablet Take 0.5 tablets (12.5 mg total) by mouth daily. 15 tablet 11   torsemide (DEMADEX) 20 MG tablet Take 1 tablet (20 mg total) by mouth daily. 30 tablet 2   carvedilol (COREG) 3.125 MG tablet Take 1 tablet (3.125 mg total) by mouth 2 (two) times daily with a meal. 60 tablet 6   No current facility-administered medications for this encounter.   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   Sulfa Antibiotics Hives   Ibuprofen Other (See Comments)    Constipation Can tolerate w milk of mag    Social History   Socioeconomic History   Marital status: Single    Spouse name: Not on file   Number of children: Not on file   Years of education: Not on file   Highest education level: Not on file  Occupational History   Not on file  Tobacco Use   Smoking status: Never   Smokeless tobacco: Never  Vaping Use   Vaping Use: Never used  Substance and Sexual Activity   Alcohol use: No   Drug use: No   Sexual activity: Never    Birth control/protection: Pill  Other Topics Concern   Not on file  Social History Narrative   Not on file   Social Determinants of Health   Financial Resource Strain: Medium Risk   Difficulty of Paying Living Expenses: Somewhat hard  Food Insecurity: No Food Insecurity   Worried About Charity fundraiser in the Last Year: Never true   Ran Out of Food in the Last Year: Never true  Transportation Needs: No Transportation Needs   Lack of Transportation (Medical): No   Lack of Transportation (Non-Medical): No  Physical Activity: Insufficiently Active   Days of Exercise per Week: 2 days    Minutes of Exercise per Session: 30 min  Stress: No Stress Concern Present   Feeling of Stress : Not at all  Social Connections: Socially Isolated   Frequency of Communication with Friends and Family: More than three times a week   Frequency of Social Gatherings with Friends and Family: More than three times a week   Attends Religious Services: Never   Marine scientist or Organizations: No   Attends Music therapist: Never   Marital Status: Never married  Human resources officer Violence: Not At Risk   Fear of Current or Ex-Partner: No   Emotionally Abused: No   Physically Abused: No   Sexually Abused: No   Family History  Problem Relation Age of Onset   Hypertension Paternal Grandmother    Coronary artery disease Paternal Grandmother    Hypertension  Maternal Grandmother    Diabetes Maternal Grandmother    Hyperlipidemia Maternal Grandmother    Thyroid disease Maternal Grandmother    Kidney disease Maternal Grandmother    Diabetes Father    Hypertension Mother    Hyperlipidemia Mother    COPD Paternal Grandfather    Heart disease Maternal Grandfather    Diabetes Maternal Grandfather    Hypertension Brother    Hyperlipidemia Brother    BP (!) 84/60 (BP Location: Right Arm)   Pulse 95   Wt 84.4 kg (186 lb)   SpO2 100%   BMI 31.43 kg/m   Wt Readings from Last 3 Encounters:  03/23/21 84.4 kg (186 lb)  03/02/21 86.5 kg (190 lb 12.8 oz)  02/22/21 90.4 kg (199 lb 4.7 oz)   PHYSICAL EXAM: General:  NAD. No resp difficulty HEENT: Normal Neck: Supple. No JVD. Carotids 2+ bilat; no bruits. No lymphadenopathy or thryomegaly appreciated. Cor: PMI nondisplaced. Regular rate & rhythm. No rubs, gallops or murmurs. Lungs: Clear, LifeVest in place. Abdomen: Soft, nontender, nondistended. No hepatosplenomegaly. No bruits or masses. Good bowel sounds. Extremities: No cyanosis, clubbing, rash, trace edema; extremities warm Neuro: Alert & oriented x 3, cranial nerves  grossly intact. Moves all 4 extremities w/o difficulty. Affect pleasant.  LifeVest Interrogation: No treatments, average HR 97, daily steps 1000, SR with occasional PVC (personally reviewed)  REDs: 39%  ASSESSMENT & PLAN: 1. Chronic systolic HF  - New diagnosis of CHF 6/22 admission. ? viral myocarditis vs familial cardiomyopathy (grandmother and aunts/uncles with CHF).   - Echo (6/22): EF of 10-15%, moderate to severely reduced RV fxn with mildly dilated RV, RVSP 45 mmHg, severe secondary MR, mod-severe TR. - cMRI (7/22): LV EF 18%, RV EF 26%, LV thrombus, Subtle non-coronary mid-wall LGE pattern in the inferior and anterolateral walls, possible prior myocarditis. - R/LHC (7/22): No CAD, with CI 2.48 on milrinone 0.25.   - NYHA II, although she is not very physically active. Volume appears slightly up on exam, ReDs 39%. - Increase torsemide to 20 mg bid x 2 days then back to daily. - Stop beta blocker (personally discussed with Dr. Haroldine Laws). - Continue Entresto 24/26 mg bid for now. (May need to switch to losaran w/ low BP). - Continue spironolactone 12.5 mg daily. - Continue dapagliflozin 10 daily. - Continue digoxin 0.0625 mg daily. - Off ivabradine with PMVT. - Overall picture concerning. 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  She is someone we may need to consider advanced therapies for, however RV dysfunction precludes VAD. BMI (31) is borderline for transplant. - Continue LifeVest - Will arrange for Cardiac Rehab in [place]. Insurance status may prevent this. - BMET and digoxin level. - I have asked her to check her BP and log. If SBP < 90, she will notify us.   2. Mitral valve regurgitation - Severe on echo this admission, only looked mild on cMRI however. - Likely secondary, valve does not look abnormal on echo. - She does not have an obvious murmur on exam.   3. Moderate-severe TR - Likely secondary. - Due to RV dilation (functional).   4. NSVT  - She is off amiodarone and ivabradine  w/ prolonged QTc and developed PMVT. - LifeVest on. - Recent mag OK.  5. RLL PE - Seen on imaging at [facility] (CTA chest). - Now on apixaban. No bleeding issues.   6. LV thrombus - On apixaban.   Follow up in 2-3 weeks with APP. Will need to keep close follow up, extremities warm  today and she is not symptomatic with her low BP. If BP remains low, switch Entresto to losartan.    [place], FNP 03/23/21

## 2021-03-23 ENCOUNTER — Other Ambulatory Visit: Payer: Self-pay

## 2021-03-23 ENCOUNTER — Other Ambulatory Visit (HOSPITAL_COMMUNITY): Payer: Self-pay

## 2021-03-23 ENCOUNTER — Encounter (HOSPITAL_COMMUNITY): Payer: Self-pay

## 2021-03-23 ENCOUNTER — Ambulatory Visit (HOSPITAL_COMMUNITY)
Admission: RE | Admit: 2021-03-23 | Discharge: 2021-03-23 | Disposition: A | Discharge status: Home / Self Care | Payer: Medicaid Other | Source: Ambulatory Visit | Attending: Family Medicine | Admitting: Family Medicine

## 2021-03-23 VITALS — BP 84/60 | HR 95 | Wt 186.0 lb

## 2021-03-23 DIAGNOSIS — Z79890 Hormone replacement therapy (postmenopausal): Secondary | ICD-10-CM | POA: Diagnosis not present

## 2021-03-23 DIAGNOSIS — Z79899 Other long term (current) drug therapy: Secondary | ICD-10-CM | POA: Insufficient documentation

## 2021-03-23 DIAGNOSIS — Z7984 Long term (current) use of oral hypoglycemic drugs: Secondary | ICD-10-CM | POA: Insufficient documentation

## 2021-03-23 DIAGNOSIS — E119 Type 2 diabetes mellitus without complications: Secondary | ICD-10-CM | POA: Insufficient documentation

## 2021-03-23 DIAGNOSIS — Z8249 Family history of ischemic heart disease and other diseases of the circulatory system: Secondary | ICD-10-CM | POA: Diagnosis not present

## 2021-03-23 DIAGNOSIS — I428 Other cardiomyopathies: Secondary | ICD-10-CM | POA: Diagnosis not present

## 2021-03-23 DIAGNOSIS — I472 Ventricular tachycardia: Secondary | ICD-10-CM | POA: Insufficient documentation

## 2021-03-23 DIAGNOSIS — Z95810 Presence of automatic (implantable) cardiac defibrillator: Secondary | ICD-10-CM

## 2021-03-23 DIAGNOSIS — E039 Hypothyroidism, unspecified: Secondary | ICD-10-CM | POA: Insufficient documentation

## 2021-03-23 DIAGNOSIS — Z7901 Long term (current) use of anticoagulants: Secondary | ICD-10-CM | POA: Diagnosis not present

## 2021-03-23 DIAGNOSIS — I2699 Other pulmonary embolism without acute cor pulmonale: Secondary | ICD-10-CM | POA: Diagnosis not present

## 2021-03-23 DIAGNOSIS — Z888 Allergy status to other drugs, medicaments and biological substances status: Secondary | ICD-10-CM | POA: Insufficient documentation

## 2021-03-23 DIAGNOSIS — I5022 Chronic systolic (congestive) heart failure: Secondary | ICD-10-CM | POA: Diagnosis not present

## 2021-03-23 DIAGNOSIS — Z886 Allergy status to analgesic agent status: Secondary | ICD-10-CM | POA: Insufficient documentation

## 2021-03-23 DIAGNOSIS — Z882 Allergy status to sulfonamides status: Secondary | ICD-10-CM | POA: Insufficient documentation

## 2021-03-23 DIAGNOSIS — Z6831 Body mass index (BMI) 31.0-31.9, adult: Secondary | ICD-10-CM | POA: Diagnosis not present

## 2021-03-23 DIAGNOSIS — I340 Nonrheumatic mitral (valve) insufficiency: Secondary | ICD-10-CM | POA: Diagnosis not present

## 2021-03-23 DIAGNOSIS — E669 Obesity, unspecified: Secondary | ICD-10-CM | POA: Insufficient documentation

## 2021-03-23 DIAGNOSIS — I071 Rheumatic tricuspid insufficiency: Secondary | ICD-10-CM

## 2021-03-23 DIAGNOSIS — I513 Intracardiac thrombosis, not elsewhere classified: Secondary | ICD-10-CM

## 2021-03-23 DIAGNOSIS — I081 Rheumatic disorders of both mitral and tricuspid valves: Secondary | ICD-10-CM | POA: Diagnosis not present

## 2021-03-23 DIAGNOSIS — I4729 Other ventricular tachycardia: Secondary | ICD-10-CM

## 2021-03-23 LAB — BASIC METABOLIC PANEL
Anion gap: 8 (ref 5–15)
BUN: 10 mg/dL (ref 6–20)
CO2: 27 mmol/L (ref 22–32)
Calcium: 9.3 mg/dL (ref 8.9–10.3)
Chloride: 102 mmol/L (ref 98–111)
Creatinine, Ser: 0.87 mg/dL (ref 0.44–1.00)
GFR, Estimated: >60 mL/min (ref >60)
Glucose, Bld: 112 mg/dL — ABNORMAL HIGH (ref 70–99)
Potassium: 4 mmol/L (ref 3.5–5.1)
Sodium: 137 mmol/L (ref 135–145)

## 2021-03-23 LAB — DIGOXIN LEVEL: Digoxin Level: 0.7 ng/mL — ABNORMAL LOW (ref 0.8–2.0)

## 2021-03-23 MED ORDER — MAGNESIUM OXIDE 400 MG PO TABS
400.0000 mg | ORAL_TABLET | Freq: Every day | ORAL | 1 refills | Status: DC
  Filled 2021-03-23: qty 30, 30d supply, fill #0
  Filled 2021-04-19: qty 30, 30d supply, fill #1

## 2021-03-23 MED ORDER — CARVEDILOL 3.125 MG PO TABS
3.1250 mg | ORAL_TABLET | Freq: Two times a day (BID) | ORAL | 6 refills | Status: DC
  Filled 2021-03-23: qty 60, 30d supply, fill #0

## 2021-03-23 NOTE — Addendum Note (Signed)
Encounter addended by: Jacklynn Ganong, FNP on: 03/23/2021 2:57 PM  Actions taken: Clinical Note Signed

## 2021-03-23 NOTE — Patient Instructions (Addendum)
DECREASE Coreg to 3.125 mg, one tab twice a day  INCREASE Torsemide to 20 mg twice a day for 2 days, then resume normal dose of 20 mg daily thereafter  Labs today We will only contact you if something comes back abnormal or we need to make some changes. Otherwise no news is good news!  You have been referred to Cardiac Rehab @ [facility] -they will be in contact to arrange orientation  Your physician has requested that you regularly monitor and record your blood pressure readings at home. Please use the same machine at the same time of day to check your readings and record them to bring to your follow-up visit. -be sure to give our office a call if your systolic blood pressure (top number) is consistently below 90.  Do the following things EVERYDAY: Weigh yourself in the morning before breakfast. Write it down and keep it in a log. Take your medicines as prescribed Eat low salt foods--Limit salt (sodium) to 2000 mg per day.  Stay as active as you can everyday Limit all fluids for the day to less than 2 liters  At the [facility], you and your health needs are our priority. As part of our continuing mission to provide you with exceptional heart care, we have created designated Provider Care Teams. These Care Teams include your primary Cardiologist (physician) and Advanced Practice Providers (APPs- Physician Assistants and Nurse Practitioners) who all work together to provide you with the care you need, when you need it.   You may see any of the following providers on your designated Care Team at your next follow up: Dr Arvilla Meres Dr Marca Ancona Dr Brandon Melnick, NP Robbie Lis, [place] Mikki Santee Karle Plumber, PharmD   Please be sure to bring in all your medications bottles to every appointment.   If you have any questions or concerns before your next appointment please send [place] a message through [place] or call our office at  [phone].    TO LEAVE A MESSAGE FOR THE NURSE SELECT OPTION 2, PLEASE LEAVE A MESSAGE INCLUDING: YOUR NAME DATE OF BIRTH CALL BACK NUMBER REASON FOR CALL**this is important as we prioritize the call backs  YOU WILL RECEIVE A CALL BACK THE SAME DAY AS LONG AS YOU CALL BEFORE 4:00 PM

## 2021-03-23 NOTE — Progress Notes (Signed)
ReDS Vest / Clip - 03/23/21 1100       ReDS Vest / Clip   Station Marker A    Ruler Value 27.5    ReDS Value Range Moderate volume overload    ReDS Actual Value 39

## 2021-03-29 ENCOUNTER — Telehealth (HOSPITAL_COMMUNITY): Payer: Self-pay | Admitting: *Deleted

## 2021-03-29 NOTE — Telephone Encounter (Signed)
Pt called stating she was supposed to contact our office if her systolic bp remained lower than 90. Pt said her bp is running in the high 80s. Pt denies any dizziness or fatigue.  Routed to [employer]

## 2021-03-30 MED ORDER — LOSARTAN POTASSIUM 25 MG PO TABS: 12.5000 mg | ORAL_TABLET | Freq: Every day | ORAL | 3 refills | Status: DC

## 2021-03-30 NOTE — Telephone Encounter (Signed)
Pt returned call and stated she stopped carvedilol pt aware and agreeable with plan.

## 2021-03-30 NOTE — Telephone Encounter (Signed)
Left vm for pt to return my call.  

## 2021-04-11 ENCOUNTER — Other Ambulatory Visit (HOSPITAL_COMMUNITY): Payer: Self-pay

## 2021-04-12 NOTE — Progress Notes (Signed)
Advanced Heart Failure Clinic Note    PCP: [facility] Dept. PCP-Cardiologist: None  HF Cardiologist: Dr. Haroldine Laws  HPI: Debra Daniel is a 35 y.o. WF with no significant medical history until recently. Newly diagnosed with systolic heart failure, nonischemic cardiomyopathy, DM II, hypothyroidism, LV thrombus & PE on Eliquis.    Presented to the ED in [facility] 02/05/21 w/ hemoptysis.  CTA chest/abdomen/pelvis with acute RLL segmental and subsegmental PE, consolidation in bilateral lower lobes, diffuse anasarca. OCPs were stopped. Echo showed EF of 25-30%, moderate to severe MR, moderate TR. She was transferred to [facility] for additional pulmonary and cardiac evaluation.   Stat echo at [facility] showed LVEF of 10-15% with mod dilated LV, severe secondary MR, moderate to severe RV dysfunction with moderately enlarged RV, severe TR. She developed cardiogenic shock and was placed on milrinone, and briefly on low-dose NE. She was diuresed with IV Lasix. She was started on amiodarone for NSVT. She underwent L/RHC (02/13/21) showing no CAD. She developed PMVT requiring shock x 2 and brief CPR. QTc long, amio stopped. Sustained another episode of PMVT, ivabradine stopped. She was transitioned to oral diuretics and GDMT initiated. Hospitalization c/b elevated LFTs, anemia, and PNA. She was fitted for LifeVest. Discharge weight 199 lbs.  Today she returns for HF follow up with her mother. Last visit we stopped beta blocker with BP in 80s. She says she feels fine and does not notice a difference. BP at home 90-100s/60-70s. Denies fatigue. She is able to walk outside for exercise without getting SOB. Denies CP, dizziness, edema, or PND/Orthopnea. Appetite ok. No fever or chills. Weight at home 183 pounds. Taking all medications. She will start CR when Medicaid is finalized. In meantime she is getting a TM and will start walking more.  Cardiac Studies: - Echo (6/22): EF 10-15%, RV moderately enlarged with severely  reduced systolic function, RVSP 16.1 mmHg, severe secondary MR, mod-severe TR.  - R/LHC (7/22): No CAD, with CI 2.48 on milrinone 0.25. Mildly elevated filling pressures, pulmonary venous hypertension.  RHC Procedural Findings (on milrinone 0.25): Hemodynamics (mmHg) RA mean 9 RV 52/17 PA 53/17, mean 31 PCWP mean 16 LV 90/14 AO 90/63  Oxygen saturations: PA 57% AO 98%  Cardiac Output (Fick) 5.1  Cardiac Index (Fick) 2.48  PVR 2.9 WU   - cMRI (7/22): LVEF 18%, diffuse hypokinesis, LV thrombus, mod dilated RV with EF 15%, mild MR, subtle non-coronary mid-wall LGE pattern in the inferior and anterolateral walls, possible prior myocarditis.  Past Medical History:  Diagnosis Date   Encounter for menstrual regulation 01/24/2015   Hypothyroid    Migraines    Obesity    UTI (urinary tract infection)    Current Outpatient Medications  Medication Sig Dispense Refill   Accu-Chek Softclix Lancets lancets Use as directed up to 4 times daily 100 each 5   acetaminophen (TYLENOL) 500 MG tablet Take 1,000 mg by mouth every 6 (six) hours as needed for mild pain.     apixaban (ELIQUIS) 5 MG TABS tablet Take 1 tablet (5 mg total) by mouth 2 (two) times daily. 60 tablet 1   Blood Glucose Monitoring Suppl (ACCU-CHEK GUIDE) w/Device KIT Use as directed 1 kit 0   digoxin (LANOXIN) 0.125 MG tablet Take 0.5 tablets (0.0625 mg total) by mouth daily. 30 tablet 2   glucose blood (ACCU-CHEK GUIDE) test strip Use as instructed up to 4 times daily 100 each 12   levothyroxine (SYNTHROID) 75 MCG tablet Take 75 mcg by mouth daily.  losartan (COZAAR) 25 MG tablet Take 0.5 tablets (12.5 mg total) by mouth at bedtime. 45 tablet 3   magnesium oxide (MAG-OX) 400 MG tablet Take 1 tablet (400 mg total) by mouth daily. 30 tablet 1   metFORMIN (GLUCOPHAGE) 500 MG tablet Take 1 tablet (500 mg total) by mouth daily with breakfast. 30 tablet 1   potassium chloride SA (KLOR-CON) 20 MEQ tablet Take 1 tablet (20 mEq  total) by mouth daily. 30 tablet 2   promethazine (PHENERGAN) 25 MG tablet Take 1 tablet (25 mg total) by mouth every 6 (six) hours as needed for nausea or vomiting. 30 tablet 0   spironolactone (ALDACTONE) 25 MG tablet Take 0.5 tablets (12.5 mg total) by mouth daily. 15 tablet 11   torsemide (DEMADEX) 20 MG tablet Take 1 tablet (20 mg total) by mouth daily. 30 tablet 2   No current facility-administered medications for this encounter.   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   Sulfa Antibiotics Hives   Ibuprofen Other (See Comments)    Constipation Can tolerate w milk of mag    Social History   Socioeconomic History   Marital status: Single    Spouse name: Not on file   Number of children: Not on file   Years of education: Not on file   Highest education level: Not on file  Occupational History   Not on file  Tobacco Use   Smoking status: Never   Smokeless tobacco: Never  Vaping Use   Vaping Use: Never used  Substance and Sexual Activity   Alcohol use: No   Drug use: No   Sexual activity: Never    Birth control/protection: Pill  Other Topics Concern   Not on file  Social History Narrative   Not on file   Social Determinants of Health   Financial Resource Strain: Medium Risk   Difficulty of Paying Living Expenses: Somewhat hard  Food Insecurity: No Food Insecurity   Worried About Charity fundraiser in the Last Year: Never true   Ran Out of Food in the Last Year: Never true  Transportation Needs: No Transportation Needs   Lack of Transportation (Medical): No   Lack of Transportation (Non-Medical): No  Physical Activity: Insufficiently Active   Days of Exercise per Week: 2 days   Minutes of Exercise per Session: 30 min  Stress: No Stress Concern Present   Feeling of Stress : Not at all  Social Connections: Socially Isolated   Frequency of Communication with Friends and Family: More than three times a week    Frequency of Social Gatherings with Friends and Family: More than three times a week   Attends Religious Services: Never   Marine scientist or Organizations: No   Attends Music therapist: Never   Marital Status: Never married  Human resources officer Violence: Not At Risk   Fear of Current or Ex-Partner: No   Emotionally Abused: No   Physically Abused: No   Sexually Abused: No   Family History  Problem Relation Age of Onset   Hypertension Paternal Grandmother    Coronary artery disease Paternal Grandmother    Hypertension Maternal Grandmother    Diabetes Maternal Grandmother    Hyperlipidemia Maternal Grandmother    Thyroid disease Maternal Grandmother    Kidney disease Maternal Grandmother    Diabetes Father    Hypertension Mother    Hyperlipidemia Mother    COPD Paternal  Grandfather    Heart disease Maternal Grandfather    Diabetes Maternal Grandfather    Hypertension Brother    Hyperlipidemia Brother    BP 90/60   Pulse (!) 115   Wt 84.6 kg (186 lb 9.6 oz)   SpO2 100%   BMI 31.54 kg/m   Wt Readings from Last 3 Encounters:  04/13/21 84.6 kg (186 lb 9.6 oz)  03/23/21 84.4 kg (186 lb)  03/02/21 86.5 kg (190 lb 12.8 oz)   PHYSICAL EXAM: General:  NAD. No resp difficulty HEENT: Normal Neck: Supple. No JVD. Carotids 2+ bilat; no bruits. No lymphadenopathy or thryomegaly appreciated. Cor: PMI nondisplaced. Regular rate & rhythm. No rubs, gallops or murmurs. Faint heart sounds. Lungs: Clear, LifeVest in place. Abdomen: Obese, nontender, nondistended. No hepatosplenomegaly. No bruits or masses. Good bowel sounds. Extremities: No cyanosis, clubbing, rash, edema Neuro: Alert & oriented x 3, cranial nerves grossly intact. Moves all 4 extremities w/o difficulty. Affect pleasant.  LifeVest Interrogation: No treatments, average HR 98, daily steps 1588, SR with occasional PVC (personally reviewed)  ASSESSMENT & PLAN: 1. Chronic systolic HF  - New diagnosis of  CHF 6/22 admission. ? viral myocarditis vs familial cardiomyopathy (grandmother and aunts/uncles with CHF).   - Echo (6/22): EF of 10-15%, moderate to severely reduced RV fxn with mildly dilated RV, RVSP 45 mmHg, severe secondary MR, mod-severe TR. - cMRI (7/22): LV EF 18%, RV EF 26%, LV thrombus, Subtle non-coronary mid-wall LGE pattern in the inferior and anterolateral walls, possible prior myocarditis. - R/LHC (7/22): No CAD, with CI 2.48 on milrinone 0.25.   - NYHA II, although she is not very physically active. Volume good today. GDMT has been limited by hypotension. - Continue torsemide 20 mg daily. - Continue losartan 12.5 mg daily (BP did not tolerate Entresto). - Continue spironolactone 12.5 mg daily. - Continue dapagliflozin 10 daily. - Continue digoxin 0.0625 mg daily. - Off beta blocker (with low BP). May be able to re-challenge. HR 110s today. - Off ivabradine with PMVT. - Overall picture concerning. 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  She is someone we may need to consider advanced therapies for, however RV dysfunction precludes VAD. BMI (31) is borderline for transplant. - Continue LifeVest. - CR when Medicaid approved. - BMET and digoxin level today. - I have asked her to check her BP and log. If SBP < 90, she will notify us.   2. Mitral valve regurgitation - Severe on echo this past admission, only looked mild on cMRI however. - Likely secondary, valve does not look abnormal on echo. - She does not have an obvious murmur on exam.   3. Moderate-severe TR - Likely secondary. - Due to RV dilation (functional).   4. NSVT  - She is off amiodarone and ivabradine w/ prolonged QTc and subsequent PMVT. - LifeVest on. Recent QTc 422 ms. - Mag 2.2 (7/22).  5. RLL PE - Seen on imaging at [facility] (CTA chest). - Now on apixaban. No bleeding issues.   6. LV thrombus - On apixaban.  - No bleeding issues.  Follow up with Dr. Haroldine Laws with echo in 2 months.    [place],  FNP 04/13/21  11:40 AM

## 2021-04-13 ENCOUNTER — Other Ambulatory Visit: Payer: Self-pay

## 2021-04-13 ENCOUNTER — Ambulatory Visit (HOSPITAL_COMMUNITY)
Admission: RE | Admit: 2021-04-13 | Discharge: 2021-04-13 | Disposition: A | Discharge status: Home / Self Care | Payer: Medicaid Other | Source: Ambulatory Visit | Attending: Family Medicine | Admitting: Family Medicine

## 2021-04-13 ENCOUNTER — Encounter (HOSPITAL_COMMUNITY): Payer: Self-pay

## 2021-04-13 VITALS — BP 90/60 | HR 115 | Wt 186.6 lb

## 2021-04-13 DIAGNOSIS — Z79899 Other long term (current) drug therapy: Secondary | ICD-10-CM | POA: Insufficient documentation

## 2021-04-13 DIAGNOSIS — E119 Type 2 diabetes mellitus without complications: Secondary | ICD-10-CM | POA: Diagnosis not present

## 2021-04-13 DIAGNOSIS — I472 Ventricular tachycardia: Secondary | ICD-10-CM | POA: Insufficient documentation

## 2021-04-13 DIAGNOSIS — E039 Hypothyroidism, unspecified: Secondary | ICD-10-CM | POA: Insufficient documentation

## 2021-04-13 DIAGNOSIS — I2699 Other pulmonary embolism without acute cor pulmonale: Secondary | ICD-10-CM

## 2021-04-13 DIAGNOSIS — Z09 Encounter for follow-up examination after completed treatment for conditions other than malignant neoplasm: Secondary | ICD-10-CM | POA: Insufficient documentation

## 2021-04-13 DIAGNOSIS — I071 Rheumatic tricuspid insufficiency: Secondary | ICD-10-CM | POA: Diagnosis not present

## 2021-04-13 DIAGNOSIS — I513 Intracardiac thrombosis, not elsewhere classified: Secondary | ICD-10-CM

## 2021-04-13 DIAGNOSIS — I428 Other cardiomyopathies: Secondary | ICD-10-CM | POA: Insufficient documentation

## 2021-04-13 DIAGNOSIS — Z8249 Family history of ischemic heart disease and other diseases of the circulatory system: Secondary | ICD-10-CM | POA: Diagnosis not present

## 2021-04-13 DIAGNOSIS — I5022 Chronic systolic (congestive) heart failure: Secondary | ICD-10-CM | POA: Insufficient documentation

## 2021-04-13 DIAGNOSIS — Z86718 Personal history of other venous thrombosis and embolism: Secondary | ICD-10-CM | POA: Diagnosis not present

## 2021-04-13 DIAGNOSIS — I340 Nonrheumatic mitral (valve) insufficiency: Secondary | ICD-10-CM

## 2021-04-13 DIAGNOSIS — Z86711 Personal history of pulmonary embolism: Secondary | ICD-10-CM | POA: Diagnosis not present

## 2021-04-13 DIAGNOSIS — Z7984 Long term (current) use of oral hypoglycemic drugs: Secondary | ICD-10-CM | POA: Diagnosis not present

## 2021-04-13 DIAGNOSIS — Z7901 Long term (current) use of anticoagulants: Secondary | ICD-10-CM | POA: Diagnosis not present

## 2021-04-13 DIAGNOSIS — I4729 Other ventricular tachycardia: Secondary | ICD-10-CM

## 2021-04-13 DIAGNOSIS — Z95810 Presence of automatic (implantable) cardiac defibrillator: Secondary | ICD-10-CM

## 2021-04-13 LAB — BASIC METABOLIC PANEL
Anion gap: 11 (ref 5–15)
BUN: 8 mg/dL (ref 6–20)
CO2: 28 mmol/L (ref 22–32)
Calcium: 9.7 mg/dL (ref 8.9–10.3)
Chloride: 98 mmol/L (ref 98–111)
Creatinine, Ser: 0.91 mg/dL (ref 0.44–1.00)
GFR, Estimated: >60 mL/min (ref >60)
Glucose, Bld: 96 mg/dL (ref 70–99)
Potassium: 3.7 mmol/L (ref 3.5–5.1)
Sodium: 137 mmol/L (ref 135–145)

## 2021-04-13 LAB — DIGOXIN LEVEL: Digoxin Level: 0.5 ng/mL — ABNORMAL LOW (ref 0.8–2.0)

## 2021-04-13 NOTE — Patient Instructions (Signed)
Labs today We will only contact you if something comes back abnormal or we need to make some changes. Otherwise no news is good news!  It was great to see you today! No medication changes are needed at this time.   Your physician recommends that you schedule a follow-up appointment in: 2-3 months with Dr Gala Romney and echo  Your physician has requested that you have an echocardiogram. Echocardiography is a painless test that uses sound waves to create images of your heart. It provides your doctor with information about the size and shape of your heart and how well your heart's chambers and valves are working. This procedure takes approximately one hour. There are no restrictions for this procedure.  Do the following things EVERYDAY: Weigh yourself in the morning before breakfast. Write it down and keep it in a log. Take your medicines as prescribed Eat low salt foods--Limit salt (sodium) to 2000 mg per day.  Stay as active as you can everyday Limit all fluids for the day to less than 2 liters  At the [facility], you and your health needs are our priority. As part of our continuing mission to provide you with exceptional heart care, we have created designated Provider Care Teams. These Care Teams include your primary Cardiologist (physician) and Advanced Practice Providers (APPs- Physician Assistants and Nurse Practitioners) who all work together to provide you with the care you need, when you need it.   You may see any of the following providers on your designated Care Team at your next follow up: Dr Arvilla Meres Dr Marca Ancona Dr Brandon Melnick, NP Robbie Lis, [place] Mikki Santee Karle Plumber, PharmD   Please be sure to bring in all your medications bottles to every appointment.    If you have any questions or concerns before your next appointment please send [place] a message through [place] or call our office at [phone].    TO LEAVE A  MESSAGE FOR THE NURSE SELECT OPTION 2, PLEASE LEAVE A MESSAGE INCLUDING: YOUR NAME DATE OF BIRTH CALL BACK NUMBER REASON FOR CALL**this is important as we prioritize the call backs  YOU WILL RECEIVE A CALL BACK THE SAME DAY AS LONG AS YOU CALL BEFORE 4:00 PM

## 2021-04-19 ENCOUNTER — Other Ambulatory Visit (HOSPITAL_COMMUNITY): Payer: Self-pay

## 2021-04-20 ENCOUNTER — Other Ambulatory Visit (HOSPITAL_COMMUNITY): Payer: Self-pay

## 2021-04-23 ENCOUNTER — Other Ambulatory Visit (HOSPITAL_COMMUNITY): Payer: Self-pay

## 2021-05-03 NOTE — Progress Notes (Signed)
Advanced Heart Failure Clinic Note    PCP: [facility] Dept. PCP-Cardiologist: None  HF Cardiologist: Dr. Haroldine Laws  HPI: Debra Daniel is a 35 y.o. WF with no significant medical history until recently. Newly diagnosed with systolic heart failure, nonischemic cardiomyopathy, DM II, hypothyroidism, LV thrombus & PE on Eliquis.    Presented to the ED in [facility] 02/05/21 w/ hemoptysis.  CTA chest/abdomen/pelvis with acute RLL segmental and subsegmental PE, consolidation in bilateral lower lobes, diffuse anasarca. OCPs were stopped. Echo showed EF of 25-30%, moderate to severe MR, moderate TR. She was transferred to [facility] for additional pulmonary and cardiac evaluation.   Stat echo at [facility] showed LVEF of 10-15% with mod dilated LV, severe secondary MR, moderate to severe RV dysfunction with moderately enlarged RV, severe TR. She developed cardiogenic shock and was placed on milrinone, and briefly on low-dose NE. She was diuresed with IV Lasix. She was started on amiodarone for NSVT. She underwent L/RHC (02/13/21) showing no CAD. She developed PMVT requiring shock x 2 and brief CPR. QTc long, amio stopped. Sustained another episode of PMVT, ivabradine stopped. She was transitioned to oral diuretics and GDMT initiated. Hospitalization c/b elevated LFTs, anemia, and PNA. She was fitted for LifeVest. Discharge weight 199 lbs.  Today she returns for HF follow up with her mother. She is walking on her TM for 6 minutes at a time, no incline. And also walking about half a mile every day. She can walk up about 9 stairs before getting SOB. BP at home 90s/60s. A1c is down and she is now off Metformin. Overall feeling fine. Denies CP, dizziness, edema, or PND/Orthopnea. Appetite ok.  Weight at home 182 pounds. Taking all medications. Continues to wear LifeVest, no issues. Medicaid is finalized now.  Cardiac Studies: - Echo (6/22): EF 10-15%, RV moderately enlarged with severely reduced systolic function, RVSP  35.4 mmHg, severe secondary MR, mod-severe TR.  - R/LHC (7/22): No CAD, with CI 2.48 on milrinone 0.25. Mildly elevated filling pressures, pulmonary venous hypertension.  RHC Procedural Findings (on milrinone 0.25): Hemodynamics (mmHg) RA mean 9 RV 52/17 PA 53/17, mean 31 PCWP mean 16 LV 90/14 AO 90/63  Oxygen saturations: PA 57% AO 98%  Cardiac Output (Fick) 5.1  Cardiac Index (Fick) 2.48  PVR 2.9 WU   - cMRI (7/22): LVEF 18%, diffuse hypokinesis, LV thrombus, mod dilated RV with EF 15%, mild MR, subtle non-coronary mid-wall LGE pattern in the inferior and anterolateral walls, possible prior myocarditis.  Past Medical History:  Diagnosis Date   Encounter for menstrual regulation 01/24/2015   Hypothyroid    Migraines    Obesity    UTI (urinary tract infection)    Current Outpatient Medications  Medication Sig Dispense Refill   Accu-Chek Softclix Lancets lancets Use as directed up to 4 times daily 100 each 5   acetaminophen (TYLENOL) 500 MG tablet Take 1,000 mg by mouth every 6 (six) hours as needed for mild pain.     apixaban (ELIQUIS) 5 MG TABS tablet Take 1 tablet (5 mg total) by mouth 2 (two) times daily. 60 tablet 1   Blood Glucose Monitoring Suppl (ACCU-CHEK GUIDE) w/Device KIT Use as directed 1 kit 0   digoxin (LANOXIN) 0.125 MG tablet Take 0.5 tablets (0.0625 mg total) by mouth daily. 30 tablet 2   glucose blood (ACCU-CHEK GUIDE) test strip Use as instructed up to 4 times daily 100 each 12   levothyroxine (SYNTHROID) 75 MCG tablet Take 75 mcg by mouth daily.  losartan (COZAAR) 25 MG tablet Take 0.5 tablets (12.5 mg total) by mouth at bedtime. 45 tablet 3   magnesium oxide (MAG-OX) 400 MG tablet Take 1 tablet (400 mg total) by mouth daily. 30 tablet 1   potassium chloride SA (KLOR-CON) 20 MEQ tablet Take 1 tablet (20 mEq total) by mouth daily. 30 tablet 2   promethazine (PHENERGAN) 25 MG tablet Take 1 tablet (25 mg total) by mouth every 6 (six) hours as needed for  nausea or vomiting. 30 tablet 0   spironolactone (ALDACTONE) 25 MG tablet Take 0.5 tablets (12.5 mg total) by mouth daily. 15 tablet 11   torsemide (DEMADEX) 20 MG tablet Take 1 tablet (20 mg total) by mouth daily. 30 tablet 2   No current facility-administered medications for this encounter.   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   Sulfa Antibiotics Hives   Ibuprofen Other (See Comments)    Constipation Can tolerate w milk of mag    Social History   Socioeconomic History   Marital status: Single    Spouse name: Not on file   Number of children: Not on file   Years of education: Not on file   Highest education level: Not on file  Occupational History   Not on file  Tobacco Use   Smoking status: Never   Smokeless tobacco: Never  Vaping Use   Vaping Use: Never used  Substance and Sexual Activity   Alcohol use: No   Drug use: No   Sexual activity: Never    Birth control/protection: Pill  Other Topics Concern   Not on file  Social History Narrative   Not on file   Social Determinants of Health   Financial Resource Strain: Medium Risk   Difficulty of Paying Living Expenses: Somewhat hard  Food Insecurity: No Food Insecurity   Worried About Charity fundraiser in the Last Year: Never true   Ran Out of Food in the Last Year: Never true  Transportation Needs: No Transportation Needs   Lack of Transportation (Medical): No   Lack of Transportation (Non-Medical): No  Physical Activity: Insufficiently Active   Days of Exercise per Week: 2 days   Minutes of Exercise per Session: 30 min  Stress: No Stress Concern Present   Feeling of Stress : Not at all  Social Connections: Socially Isolated   Frequency of Communication with Friends and Family: More than three times a week   Frequency of Social Gatherings with Friends and Family: More than three times a week   Attends Religious Services: Never   Corporate treasurer or Organizations: No   Attends Music therapist: Never   Marital Status: Never married  Human resources officer Violence: Not At Risk   Fear of Current or Ex-Partner: No   Emotionally Abused: No   Physically Abused: No   Sexually Abused: No   Family History  Problem Relation Age of Onset   Hypertension Paternal Grandmother    Coronary artery disease Paternal Grandmother    Hypertension Maternal Grandmother    Diabetes Maternal Grandmother    Hyperlipidemia Maternal Grandmother    Thyroid disease Maternal Grandmother    Kidney disease Maternal Grandmother    Diabetes Father    Hypertension Mother    Hyperlipidemia Mother    COPD Paternal Grandfather    Heart disease Maternal Grandfather    Diabetes Maternal Grandfather    Hypertension Brother  Hyperlipidemia Brother    BP 100/60   Pulse (!) 104   Wt 85.1 kg (187 lb 9.6 oz)   SpO2 99%   BMI 31.70 kg/m   Wt Readings from Last 3 Encounters:  05/04/21 85.1 kg (187 lb 9.6 oz)  04/13/21 84.6 kg (186 lb 9.6 oz)  03/23/21 84.4 kg (186 lb)   PHYSICAL EXAM: General:  NAD. No resp difficulty HEENT: Normal Neck: Supple. No JVD. Carotids 2+ bilat; no bruits. No lymphadenopathy or thryomegaly appreciated. Cor: PMI nondisplaced. Regular rate & rhythm. No rubs, gallops or murmurs. Lungs: Clear Abdomen: Obese, nontender, nondistended. No hepatosplenomegaly. No bruits or masses. Good bowel sounds. Extremities: No cyanosis, clubbing, rash, edema Neuro: Alert & oriented x 3, cranial nerves grossly intact. Moves all 4 extremities w/o difficulty. Affect pleasant.  LifeVest Interrogation: No treatments, average HR 99, daily steps 1969, SR with occasional PVC (personally reviewed).  Bedside echo today (05/04/21): 20-25% (performed by Dr. Haroldine Laws today.)  ASSESSMENT & PLAN: 1. Chronic systolic HF  - New diagnosis of CHF 6/22 admission. ? viral myocarditis vs familial cardiomyopathy (grandmother and aunts/uncles with CHF).    - Echo (6/22): EF of 10-15%, moderate to severely reduced RV fxn with mildly dilated RV, RVSP 45 mmHg, severe secondary MR, mod-severe TR. - cMRI (7/22): LV EF 18%, RV EF 26%, LV thrombus, Subtle non-coronary mid-wall LGE pattern in the inferior and anterolateral walls, possible prior myocarditis. - R/LHC (7/22): No CAD, with CI 2.48 on milrinone 0.25.   - NYHA II, volume good today. GDMT has been limited by hypotension. - Start Toprol 12.5 mg q hs (may not tolerate, had low BP with low-dose carvedilol). - Continue torsemide 20 mg daily. - Continue losartan 12.5 mg daily (BP did not tolerate Entresto). - Continue spironolactone 12.5 mg daily. - Continue dapagliflozin 10 daily. - Continue digoxin 0.0625 mg daily. - Off ivabradine with PMVT. - Overall picture concerning. 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  She is someone we may need to consider advanced therapies for, however RV dysfunction precludes VAD. BMI (31) is borderline for transplant. - Continue LifeVest with EF 20-25% today on bedside echo. Will renew authorization. - CR when Medicaid approved. - BMET and digoxin level today.   2. Mitral valve regurgitation - Severe on echo this past admission, only looked mild on cMRI however. - Likely secondary, valve does not look abnormal on echo. - She does not have an obvious murmur on exam.   3. Moderate-severe TR - Likely secondary. - Due to RV dilation (functional).   4. NSVT  - She is off amiodarone and ivabradine w/ prolonged QTc and subsequent PMVT. - LifeVest on. Recent QTc 422 ms. - Mag 2.2 (7/22).  5. RLL PE - Seen on imaging at [facility] (CTA chest). - Now on apixaban. No bleeding issues.   6. LV thrombus - On apixaban.  - No bleeding issues.  Follow up with Dr. Haroldine Laws with echo in 3 months as scheduled.   [place], FNP 05/04/21  11:24 AM  Patient seen and examined with the above-signed Advanced Practice Provider and/or Housestaff. I personally reviewed laboratory  data, imaging studies and relevant notes. I independently examined the patient and formulated the important aspects of the plan. I have edited the note to reflect any of my changes or salient points. I have personally discussed the plan with the patient and/or family.  Overall feeling better. NYHA II-III. Remains mildly tachycardic. Volume status ok. Compliant with meds. Remains on LifeVest with no events/alarms.  General:  Well appearing. No resp difficulty HEENT: normal Neck: supple. no JVD. Carotids 2+ bilat; no bruits. No lymphadenopathy or thryomegaly appreciated. Cor: PMI nondisplaced. Regular mildly tachy +S3 Lungs: clear Abdomen: obese soft, nontender, nondistended. No hepatosplenomegaly. No bruits or masses. Good bowel sounds. Extremities: no cyanosis, clubbing, rash, edema Neuro: alert & orientedx3, cranial nerves grossly intact. moves all 4 extremities w/o difficulty. Affect pleasant  Clinically improved but exam still concerning with resting tachycardia and S3.  I did bedside echo and EF 20-25%. Will continue LifeVest. Need to consider CPX testing to assess for possible need for advanced therapies.   Total time spent 35 minutes. Over half that time spent discussing above.    Glori Bickers, MD  10:56 PM

## 2021-05-04 ENCOUNTER — Other Ambulatory Visit: Payer: Self-pay

## 2021-05-04 ENCOUNTER — Encounter (HOSPITAL_COMMUNITY): Payer: Self-pay

## 2021-05-04 ENCOUNTER — Ambulatory Visit (HOSPITAL_COMMUNITY)
Admission: RE | Admit: 2021-05-04 | Discharge: 2021-05-04 | Disposition: A | Discharge status: Home / Self Care | Payer: Medicaid Other | Source: Ambulatory Visit | Attending: Family Medicine | Admitting: Family Medicine

## 2021-05-04 VITALS — BP 100/60 | HR 104 | Wt 187.6 lb

## 2021-05-04 DIAGNOSIS — I272 Pulmonary hypertension, unspecified: Secondary | ICD-10-CM | POA: Diagnosis not present

## 2021-05-04 DIAGNOSIS — Z95810 Presence of automatic (implantable) cardiac defibrillator: Secondary | ICD-10-CM | POA: Diagnosis not present

## 2021-05-04 DIAGNOSIS — I472 Ventricular tachycardia: Secondary | ICD-10-CM | POA: Diagnosis not present

## 2021-05-04 DIAGNOSIS — Z79899 Other long term (current) drug therapy: Secondary | ICD-10-CM | POA: Insufficient documentation

## 2021-05-04 DIAGNOSIS — I4729 Other ventricular tachycardia: Secondary | ICD-10-CM

## 2021-05-04 DIAGNOSIS — Z09 Encounter for follow-up examination after completed treatment for conditions other than malignant neoplasm: Secondary | ICD-10-CM | POA: Diagnosis not present

## 2021-05-04 DIAGNOSIS — R0602 Shortness of breath: Secondary | ICD-10-CM | POA: Insufficient documentation

## 2021-05-04 DIAGNOSIS — Z86718 Personal history of other venous thrombosis and embolism: Secondary | ICD-10-CM | POA: Diagnosis not present

## 2021-05-04 DIAGNOSIS — Z86711 Personal history of pulmonary embolism: Secondary | ICD-10-CM | POA: Insufficient documentation

## 2021-05-04 DIAGNOSIS — I428 Other cardiomyopathies: Secondary | ICD-10-CM | POA: Insufficient documentation

## 2021-05-04 DIAGNOSIS — I2699 Other pulmonary embolism without acute cor pulmonale: Secondary | ICD-10-CM

## 2021-05-04 DIAGNOSIS — Z7901 Long term (current) use of anticoagulants: Secondary | ICD-10-CM | POA: Insufficient documentation

## 2021-05-04 DIAGNOSIS — I5022 Chronic systolic (congestive) heart failure: Secondary | ICD-10-CM | POA: Insufficient documentation

## 2021-05-04 DIAGNOSIS — E119 Type 2 diabetes mellitus without complications: Secondary | ICD-10-CM | POA: Insufficient documentation

## 2021-05-04 DIAGNOSIS — Z8249 Family history of ischemic heart disease and other diseases of the circulatory system: Secondary | ICD-10-CM | POA: Diagnosis not present

## 2021-05-04 LAB — BASIC METABOLIC PANEL
Anion gap: 10 (ref 5–15)
BUN: 15 mg/dL (ref 6–20)
CO2: 28 mmol/L (ref 22–32)
Calcium: 9.4 mg/dL (ref 8.9–10.3)
Chloride: 100 mmol/L (ref 98–111)
Creatinine, Ser: 0.92 mg/dL (ref 0.44–1.00)
GFR, Estimated: >60 mL/min (ref >60)
Glucose, Bld: 122 mg/dL — ABNORMAL HIGH (ref 70–99)
Potassium: 3.7 mmol/L (ref 3.5–5.1)
Sodium: 138 mmol/L (ref 135–145)

## 2021-05-04 LAB — DIGOXIN LEVEL: Digoxin Level: 0.3 ng/mL — ABNORMAL LOW (ref 0.8–2.0)

## 2021-05-04 MED ORDER — MAGNESIUM OXIDE 400 MG PO TABS: 400.0000 mg | ORAL_TABLET | Freq: Every day | ORAL | 11 refills | Status: DC

## 2021-05-04 MED ORDER — METOPROLOL TARTRATE 25 MG PO TABS: 12.5000 mg | ORAL_TABLET | Freq: Every day | ORAL | 11 refills | Status: DC

## 2021-05-04 NOTE — Patient Instructions (Addendum)
Labs done today. We will contact you only if your labs are abnormal.  START Metoprolol 12.5mg  (1/2 tablet) by mouth daily at bedtime.   No medication changes were made. Please continue all current medications as prescribed.  Your physician recommends that you keep your pending appointment.   If you have any questions or concerns before your next appointment please send [place] a message through [place] or call our office at [phone].    TO LEAVE A MESSAGE FOR THE NURSE SELECT OPTION 2, PLEASE LEAVE A MESSAGE INCLUDING: YOUR NAME DATE OF BIRTH CALL BACK NUMBER REASON FOR CALL**this is important as we prioritize the call backs  YOU WILL RECEIVE A CALL BACK THE SAME DAY AS LONG AS YOU CALL BEFORE 4:00 PM   Do the following things EVERYDAY: Weigh yourself in the morning before breakfast. Write it down and keep it in a log. Take your medicines as prescribed Eat low salt foods--Limit salt (sodium) to 2000 mg per day.  Stay as active as you can everyday Limit all fluids for the day to less than 2 liters   At the [facility], you and your health needs are our priority. As part of our continuing mission to provide you with exceptional heart care, we have created designated Provider Care Teams. These Care Teams include your primary Cardiologist (physician) and Advanced Practice Providers (APPs- Physician Assistants and Nurse Practitioners) who all work together to provide you with the care you need, when you need it.   You may see any of the following providers on your designated Care Team at your next follow up: Dr Arvilla Meres Dr Carron Curie, NP Robbie Lis, [place] Karle Plumber, PharmD   Please be sure to bring in all your medications bottles to every appointment.

## 2021-05-22 ENCOUNTER — Other Ambulatory Visit (HOSPITAL_COMMUNITY): Payer: Self-pay

## 2021-06-11 ENCOUNTER — Other Ambulatory Visit (HOSPITAL_COMMUNITY): Payer: Self-pay

## 2021-06-19 ENCOUNTER — Other Ambulatory Visit (HOSPITAL_COMMUNITY): Payer: Self-pay | Admitting: Family Medicine

## 2021-07-16 ENCOUNTER — Encounter (HOSPITAL_COMMUNITY): Payer: Self-pay | Admitting: Internal Medicine

## 2021-07-16 ENCOUNTER — Encounter: Payer: Self-pay | Admitting: Internal Medicine

## 2021-07-16 ENCOUNTER — Ambulatory Visit (HOSPITAL_COMMUNITY)
Admission: RE | Admit: 2021-07-16 | Discharge: 2021-07-16 | Disposition: A | Discharge status: Home / Self Care | Payer: Medicaid Other | Source: Ambulatory Visit | Attending: Internal Medicine | Admitting: Internal Medicine

## 2021-07-16 ENCOUNTER — Other Ambulatory Visit (HOSPITAL_COMMUNITY): Payer: Self-pay | Admitting: Internal Medicine

## 2021-07-16 ENCOUNTER — Ambulatory Visit (HOSPITAL_BASED_OUTPATIENT_CLINIC_OR_DEPARTMENT_OTHER)
Admission: RE | Admit: 2021-07-16 | Discharge: 2021-07-16 | Disposition: A | Discharge status: Home / Self Care | Payer: Medicaid Other | Source: Ambulatory Visit | Attending: Internal Medicine | Admitting: Internal Medicine

## 2021-07-16 ENCOUNTER — Other Ambulatory Visit: Payer: Self-pay

## 2021-07-16 VITALS — BP 120/78 | HR 91 | Wt 182.4 lb

## 2021-07-16 DIAGNOSIS — Z79899 Other long term (current) drug therapy: Secondary | ICD-10-CM | POA: Diagnosis not present

## 2021-07-16 DIAGNOSIS — I2699 Other pulmonary embolism without acute cor pulmonale: Secondary | ICD-10-CM

## 2021-07-16 DIAGNOSIS — I493 Ventricular premature depolarization: Secondary | ICD-10-CM

## 2021-07-16 DIAGNOSIS — I472 Ventricular tachycardia, unspecified: Secondary | ICD-10-CM | POA: Diagnosis not present

## 2021-07-16 DIAGNOSIS — E039 Hypothyroidism, unspecified: Secondary | ICD-10-CM | POA: Insufficient documentation

## 2021-07-16 DIAGNOSIS — Z7901 Long term (current) use of anticoagulants: Secondary | ICD-10-CM | POA: Insufficient documentation

## 2021-07-16 DIAGNOSIS — I428 Other cardiomyopathies: Secondary | ICD-10-CM | POA: Insufficient documentation

## 2021-07-16 DIAGNOSIS — I340 Nonrheumatic mitral (valve) insufficiency: Secondary | ICD-10-CM

## 2021-07-16 DIAGNOSIS — I5022 Chronic systolic (congestive) heart failure: Secondary | ICD-10-CM

## 2021-07-16 DIAGNOSIS — E119 Type 2 diabetes mellitus without complications: Secondary | ICD-10-CM | POA: Insufficient documentation

## 2021-07-16 DIAGNOSIS — Z95810 Presence of automatic (implantable) cardiac defibrillator: Secondary | ICD-10-CM

## 2021-07-16 DIAGNOSIS — Z86711 Personal history of pulmonary embolism: Secondary | ICD-10-CM | POA: Insufficient documentation

## 2021-07-16 LAB — ECHOCARDIOGRAM COMPLETE
Area-P 1/2: 5.13 cm2
Calc EF: 20.7 %
MV M vel: 4.33 m/s
MV Peak grad: 75 mmHg
S' Lateral: 5.5 cm
Single Plane A2C EF: 20.6 %
Single Plane A4C EF: 20.1 %

## 2021-07-16 LAB — BASIC METABOLIC PANEL
Anion gap: 8 (ref 5–15)
BUN: 9 mg/dL (ref 6–20)
CO2: 28 mmol/L (ref 22–32)
Calcium: 9.2 mg/dL (ref 8.9–10.3)
Chloride: 100 mmol/L (ref 98–111)
Creatinine, Ser: 0.78 mg/dL (ref 0.44–1.00)
GFR, Estimated: >60 mL/min (ref >60)
Glucose, Bld: 96 mg/dL (ref 70–99)
Potassium: 3.8 mmol/L (ref 3.5–5.1)
Sodium: 136 mmol/L (ref 135–145)

## 2021-07-16 LAB — DIGOXIN LEVEL: Digoxin Level: 0.4 ng/mL — ABNORMAL LOW (ref 0.8–2.0)

## 2021-07-16 LAB — BRAIN NATRIURETIC PEPTIDE: B Natriuretic Peptide: 125.9 pg/mL — ABNORMAL HIGH (ref 0.0–100.0)

## 2021-07-16 MED ORDER — TORSEMIDE 20 MG PO TABS: 20.0000 mg | ORAL_TABLET | Freq: Every day | ORAL | 2 refills | Status: DC

## 2021-07-16 MED ORDER — LOSARTAN POTASSIUM 25 MG PO TABS: 25.0000 mg | ORAL_TABLET | Freq: Every day | ORAL | 6 refills | Status: DC

## 2021-07-16 MED ORDER — DIGOXIN 125 MCG PO TABS: 0.0625 mg | ORAL_TABLET | Freq: Every day | ORAL | 2 refills | Status: DC

## 2021-07-16 MED ORDER — METOPROLOL TARTRATE 25 MG PO TABS: 25.0000 mg | ORAL_TABLET | Freq: Every day | ORAL | 6 refills | Status: DC

## 2021-07-16 MED ORDER — POTASSIUM CHLORIDE CRYS ER 20 MEQ PO TBCR: 20.0000 meq | EXTENDED_RELEASE_TABLET | Freq: Every day | ORAL | 2 refills | Status: DC

## 2021-07-16 NOTE — Progress Notes (Signed)
[facility] Note    PCP: [facility]. PCP-Cardiologist: None  HF Cardiologist: Dr. Haroldine Laws  HPI: Debra Daniel is a 35 y.o. WF with no significant medical history until recently. Newly diagnosed with systolic heart failure, nonischemic cardiomyopathy, DM II, hypothyroidism, LV thrombus & PE on Eliquis.    Presented to the ED in [facility] 02/05/21 w/ hemoptysis.  CTA chest/abdomen/pelvis with acute RLL segmental and subsegmental PE, consolidation in bilateral lower lobes, diffuse anasarca. OCPs were stopped. Echo showed EF of 25-30%, moderate to severe MR, moderate TR. She was transferred to [facility] for additional pulmonary and cardiac evaluation.   Stat echo at [facility] showed LVEF of 10-15% with mod dilated LV, severe secondary MR, moderate to severe RV dysfunction with moderately enlarged RV, severe TR. She developed cardiogenic shock and was placed on milrinone, and briefly on low-dose NE. She was diuresed with IV Lasix. She was started on amiodarone for NSVT. She underwent L/RHC (02/13/21) showing no CAD. She developed PMVT requiring shock x 2 and brief CPR. QTc long, amio stopped. Sustained another episode of PMVT, ivabradine stopped. She was transitioned to oral diuretics and GDMT initiated. Hospitalization c/b elevated LFTs, anemia, and PNA. She was fitted for LifeVest. Discharge weight 199 lbs.  Today she returns for HF follow up with her mother.  Walk 1.5-2.0 miles per day. No CP, SOB, edema, orthopnea or PND. Compliant with all meds. SBP 90-106. HR 90s at home  Wearing LifeVest  Echo today 07/16/21 EF 20-25% RV normal. Mild to mod MR. Mild TR Personally reviewed   Cardiac Studies: - Echo (6/22): EF 10-15%, RV moderately enlarged with severely reduced systolic function, RVSP 64.6 mmHg, severe secondary MR, mod-severe TR.  - R/LHC (7/22): No CAD, with CI 2.48 on milrinone 0.25. Mildly elevated filling pressures, pulmonary venous hypertension.  RHC Procedural  Findings (on milrinone 0.25): Hemodynamics (mmHg) RA mean 9 RV 52/17 PA 53/17, mean 31 PCWP mean 16 LV 90/14 AO 90/63  Oxygen saturations: PA 57% AO 98%  Cardiac Output (Fick) 5.1  Cardiac Index (Fick) 2.48  PVR 2.9 WU   - cMRI (7/22): LVEF 18%, diffuse hypokinesis, LV thrombus, mod dilated RV with EF 15%, mild MR, subtle non-coronary mid-wall LGE pattern in the inferior and anterolateral walls, possible prior myocarditis.  Past Medical History:  Diagnosis Date   Encounter for menstrual regulation 01/24/2015   Hypothyroid    Migraines    Obesity    UTI (urinary tract infection)    Current Outpatient Medications  Medication Sig Dispense Refill   Accu-Chek Softclix Lancets lancets Use as directed up to 4 times daily 100 each 5   acetaminophen (TYLENOL) 500 MG tablet Take 1,000 mg by mouth every 6 (six) hours as needed for mild pain.     apixaban (ELIQUIS) 5 MG TABS tablet Take 1 tablet (5 mg total) by mouth 2 (two) times daily. 60 tablet 1   Blood Glucose Monitoring Suppl (ACCU-CHEK GUIDE) w/Device KIT Use as directed 1 kit 0   digoxin (LANOXIN) 0.125 MG tablet Take 0.5 tablets (0.0625 mg total) by mouth daily. 30 tablet 2   glucose blood (ACCU-CHEK GUIDE) test strip Use as instructed up to 4 times daily 100 each 12   levothyroxine (SYNTHROID) 75 MCG tablet Take 75 mcg by mouth daily.     losartan (COZAAR) 25 MG tablet Take 0.5 tablets (12.5 mg total) by mouth at bedtime. 45 tablet 3   magnesium oxide (MAG-OX) 400 MG tablet Take 1 tablet (400 mg total)  by mouth daily. 30 tablet 11   metoprolol tartrate (LOPRESSOR) 25 MG tablet Take 0.5 tablets (12.5 mg total) by mouth at bedtime. 15 tablet 11   potassium chloride SA (KLOR-CON) 20 MEQ tablet TAKE 1 TABLET BY MOUTH ONCE DAILY. 30 tablet 0   promethazine (PHENERGAN) 25 MG tablet Take 1 tablet (25 mg total) by mouth every 6 (six) hours as needed for nausea or vomiting. 30 tablet 0   spironolactone (ALDACTONE) 25 MG tablet Take 0.5  tablets (12.5 mg total) by mouth daily. 15 tablet 11   torsemide (DEMADEX) 20 MG tablet TAKE 1 TABLET BY MOUTH ONCE DAILY. 30 tablet 0   No current facility-administered medications for this encounter.   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   Sulfa Antibiotics Hives   Ibuprofen Other (See Comments)    Constipation Can tolerate w milk of mag    Social History   Socioeconomic History   Marital status: Single    Spouse name: Not on file   Number of children: Not on file   Years of education: Not on file   Highest education level: Not on file  Occupational History   Not on file  Tobacco Use   Smoking status: Never   Smokeless tobacco: Never  Vaping Use   Vaping Use: Never used  Substance and Sexual Activity   Alcohol use: No   Drug use: No   Sexual activity: Never    Birth control/protection: Pill  Other Topics Concern   Not on file  Social History Narrative   Not on file   Social Determinants of Health   Financial Resource Strain: Medium Risk   Difficulty of Paying Living Expenses: Somewhat hard  Food Insecurity: No Food Insecurity   Worried About Charity fundraiser in the Last Year: Never true   Ran Out of Food in the Last Year: Never true  Transportation Needs: No Transportation Needs   Lack of Transportation (Medical): No   Lack of Transportation (Non-Medical): No  Physical Activity: Insufficiently Active   Days of Exercise per Week: 2 days   Minutes of Exercise per Session: 30 min  Stress: No Stress Concern Present   Feeling of Stress : Not at all  Social Connections: Socially Isolated   Frequency of Communication with Friends and Family: More than three times a week   Frequency of Social Gatherings with Friends and Family: More than three times a week   Attends Religious Services: Never   Marine scientist or Organizations: No   Attends Music therapist: Never   Marital Status:  Never married  Human resources officer Violence: Not At Risk   Fear of Current or Ex-Partner: No   Emotionally Abused: No   Physically Abused: No   Sexually Abused: No   Family History  Problem Relation Age of Onset   Hypertension Paternal Grandmother    Coronary artery disease Paternal Grandmother    Hypertension Maternal Grandmother    Diabetes Maternal Grandmother    Hyperlipidemia Maternal Grandmother    Thyroid disease Maternal Grandmother    Kidney disease Maternal Grandmother    Diabetes Father    Hypertension Mother    Hyperlipidemia Mother    COPD Paternal Grandfather    Heart disease Maternal Grandfather    Diabetes Maternal Grandfather    Hypertension Brother    Hyperlipidemia Brother    BP 120/78   Pulse 91   Wt  82.7 kg (182 lb 6.4 oz)   SpO2 100%   BMI 30.83 kg/m   Wt Readings from Last 3 Encounters:  07/16/21 82.7 kg (182 lb 6.4 oz)  05/04/21 85.1 kg (187 lb 9.6 oz)  04/13/21 84.6 kg (186 lb 9.6 oz)   PHYSICAL EXAM: General:  Well appearing. No resp difficulty HEENT: normal Neck: supple. no JVD. Carotids 2+ bilat; no bruits. No lymphadenopathy or thryomegaly appreciated. Cor: PMI nondisplaced. Regular rate & rhythm. No rubs, gallops or murmurs. Lungs: clear Abdomen: soft, nontender, nondistended. No hepatosplenomegaly. No bruits or masses. Good bowel sounds. Extremities: no cyanosis, clubbing, rash, edema Neuro: alert & orientedx3, cranial nerves grossly intact. moves all 4 extremities w/o difficulty. Affect pleasant   LifeVest Interrogation: No treatments, average HR 99, daily steps 1969, SR with occasional PVC (personally reviewed).  Bedside echo today (05/04/21): 20-25% (performed by Dr. Haroldine Laws today.)  ASSESSMENT & PLAN: 1. Chronic systolic HF  - New diagnosis of CHF 6/22 admission. ? viral myocarditis vs familial cardiomyopathy (grandmother and aunts/uncles with CHF).   - Echo (6/22): EF of 10-15%, moderate to severely reduced RV fxn with mildly  dilated RV, RVSP 45 mmHg, severe secondary MR, mod-severe TR. - cMRI (7/22): LV EF 18%, RV EF 26%, LV thrombus, Subtle non-coronary mid-wall LGE pattern in the inferior and anterolateral walls, possible prior myocarditis. - Bedside echo 9/22 EF 20-25%  - R/LHC (7/22): No CAD, with CI 2.48 on milrinone 0.25.   - Echo today 07/16/21 EF 20-25% LV smaller Mild to mod MR. Mild TR - Improving NYHA II. Volume status looks good - Increase Toprol to 25 mg qhs - Continue torsemide 20 mg daily. - Increase losartan to 25 mg daily (BP did not tolerate Entresto). - Continue spironolactone 12.5 mg daily. - Continue dapagliflozin 10 daily. - Continue digoxin 0.0625 mg daily. - Off ivabradine with PMVT. - Continue LifeVest - Labs today - EF stable at 20-25% but less dilated. Hopefully will continue to improve with medical therapy. Continue LifeVest.  - Repeat echo in 3-4 months. If EF still down consider ICD - Refer to Dr. Broadus John for Genetics Screening   2. Mitral valve regurgitation - Severe on echo this past admission, only looked mild on cMRI however. - MR mild to moderate on echo today    3. Moderate-severe TR - Likely secondary. - echo today Mild TR   4. NSVT  - She is off amiodarone and ivabradine w/ prolonged QTc and subsequent PMVT. - LifeVest on. Recent QTc 422 ms. LifeVest interrogated. No VT.   6. LV thrombus - No clot on echo today - On apixaban.  - No bleeding issues.    Glori Bickers, MD 07/16/21  12:25 PM

## 2021-07-16 NOTE — Patient Instructions (Signed)
Increase Metoprolol to 25 mg Daily  Increase Losartan to 25 mg Daily  Labs done today, your results will be available in MyChart, we will contact you for abnormal readings.  You have been referred to Dr Jomarie Longs for genetic counseling, her office will call you to schedule an appointment  Your physician recommends that you schedule a follow-up appointment in: 3-4 months with an echocardiogram  Do the following things EVERYDAY: Weigh yourself in the morning before breakfast. Write it down and keep it in a log. Take your medicines as prescribed Eat low salt foods--Limit salt (sodium) to 2000 mg per day.  Stay as active as you can everyday Limit all fluids for the day to less than 2 liters   Please be sure to bring in all your medications bottles to every appointment.   If you have any questions or concerns before your next appointment please send [place] a message through [place] or call our office at [phone].    TO LEAVE A MESSAGE FOR THE NURSE SELECT OPTION 2, PLEASE LEAVE A MESSAGE INCLUDING: YOUR NAME DATE OF BIRTH CALL BACK NUMBER REASON FOR CALL**this is important as we prioritize the call backs  YOU WILL RECEIVE A CALL BACK THE SAME DAY AS LONG AS YOU CALL BEFORE 4:00 PM  At the [facility], you and your health needs are our priority. As part of our continuing mission to provide you with exceptional heart care, we have created designated Provider Care Teams. These Care Teams include your primary Cardiologist (physician) and Advanced Practice Providers (APPs- Physician Assistants and Nurse Practitioners) who all work together to provide you with the care you need, when you need it.   You may see any of the following providers on your designated Care Team at your next follow up: Dr Arvilla Meres Dr Carron Curie, NP Robbie Lis, [place] [facility] [place], [place] Karle Plumber, PharmD   Please be sure to bring in all your medications  bottles to every appointment.

## 2021-07-18 ENCOUNTER — Other Ambulatory Visit: Payer: Self-pay

## 2021-07-18 ENCOUNTER — Ambulatory Visit (INDEPENDENT_AMBULATORY_CARE_PROVIDER_SITE_OTHER): Payer: Medicaid Other | Admitting: Adult Health

## 2021-07-18 ENCOUNTER — Encounter: Payer: Self-pay | Admitting: Adult Health

## 2021-07-18 VITALS — BP 100/66 | HR 43 | Ht 64.0 in | Wt 186.0 lb

## 2021-07-18 DIAGNOSIS — Z86711 Personal history of pulmonary embolism: Secondary | ICD-10-CM

## 2021-07-18 DIAGNOSIS — D500 Iron deficiency anemia secondary to blood loss (chronic): Secondary | ICD-10-CM | POA: Diagnosis not present

## 2021-07-18 DIAGNOSIS — Z8679 Personal history of other diseases of the circulatory system: Secondary | ICD-10-CM

## 2021-07-18 DIAGNOSIS — N921 Excessive and frequent menstruation with irregular cycle: Secondary | ICD-10-CM

## 2021-07-18 LAB — POCT HEMOGLOBIN: Hemoglobin: 7.1 g/dL — AB (ref 11–14.6)

## 2021-07-18 MED ORDER — IRON 325 (65 FE) MG PO TABS: ORAL_TABLET | ORAL | 0 refills | Status: DC

## 2021-07-18 MED ORDER — MEGESTROL ACETATE 40 MG PO TABS: ORAL_TABLET | ORAL | 1 refills | Status: DC

## 2021-07-18 NOTE — Progress Notes (Signed)
  Subjective:     Patient ID: Debra Daniel, female   DOB: 03/15/1986, 35 y.o.   MRN: [id number]  HPI Debra Daniel is a 35 year old white female,single, G0P0, in complaining of bleeding since June 28, 2021. She had UTI in the summer and was treated and then in June started vomiting blood and went to ER and had PE and heart problem and was sent to [facility] and was in CHF she says. She has life vest and awaiting defibrillator has to wait to heart function at 35%. She is on Eliquis and just saw cardi logist this week  and is doing better, she says. She may getting genetic testing for familial cardiomyopathy. May have had viral myocarditis.  Lab Results  Component Value Date   DIAGPAP  07/25/2020    - Negative for intraepithelial lesion or malignancy (NILM)   HPV NOT DETECTED 04/04/2017   HPVHIGH Negative 07/25/2020   PCP is [facility] Review of Systems Bleeding since 06/28/21   Reviewed past medical,surgical, social and family history. Reviewed medications and allergies.     Objective:   Physical Exam BP 100/66 (BP Location: Right Arm, Patient Position: Sitting, Cuff Size: Normal)   Pulse (!) 43   Ht 5\' 4"  (1.626 m)   Wt 186 lb (84.4 kg)   LMP 06/28/2021 Comment: still bleeding  BMI 31.93 kg/m     Finger stick HGB is 7.1 Skin warm and dry. Lungs: clear to ausculation bilaterally. Cardiovascular: regular rate and rhythm.   Upstream - 07/18/21 1034       Pregnancy Intention Screening   Does the patient want to become pregnant in the next year? No    Does the patient's partner want to become pregnant in the next year? No    Would the patient like to discuss contraceptive options today? No      Contraception Wrap Up   Current Method Abstinence    End Method Abstinence    Contraception Counseling Provided No            Discussed with Dr 14/07/22 and will start on megace and get back to see him about endometrial ablation.  Assessment:     1. Menorrhagia with irregular cycle Will start megace  to stop bleeding Meds ordered this encounter  Medications   megestrol (MEGACE) 40 MG tablet    Sig: Take 3 x 5 days then 2 x 5 days then 1 daily    Dispense:  45 tablet    Refill:  1    Order Specific Question:   Supervising Provider    Answer:   Despina Hidden H [[id number]]   Ferrous Sulfate (IRON) 325 (65 Fe) MG TABS    Sig: Take 2 daily    Dispense:  30 tablet    Refill:  0    Order Specific Question:   Supervising Provider    Answer:   Duane Lope, LUTHER H [[id number]]     2. Iron deficiency anemia due to chronic blood loss Check CBC today Take OTC iron   3. History of pulmonary embolus (PE)  4. History of CHF (congestive heart failure)     Plan:     Follow up with Dr Despina Hidden in 4 weeks

## 2021-07-19 LAB — CBC
Hematocrit: 28.2 % — ABNORMAL LOW (ref 34.0–46.6)
Hemoglobin: 8.9 g/dL — ABNORMAL LOW (ref 11.1–15.9)
MCH: 27.4 pg (ref 26.6–33.0)
MCHC: 31.6 g/dL (ref 31.5–35.7)
MCV: 87 fL (ref 79–97)
Platelets: 454 10*3/uL — ABNORMAL HIGH (ref 150–450)
RBC: 3.25 x10E6/uL — ABNORMAL LOW (ref 3.77–5.28)
RDW: 13.1 % (ref 11.7–15.4)
WBC: 11.4 10*3/uL — ABNORMAL HIGH (ref 3.4–10.8)

## 2021-08-08 NOTE — Addendum Note (Signed)
Encounter addended by: Crissie Figures, RN on: 08/08/2021 12:11 PM  Actions taken: Imaging Exam ended

## 2021-08-10 ENCOUNTER — Encounter: Payer: Self-pay | Admitting: *Deleted

## 2021-08-16 ENCOUNTER — Encounter: Payer: Self-pay | Admitting: Obstetrics & Gynecology

## 2021-08-16 ENCOUNTER — Ambulatory Visit (INDEPENDENT_AMBULATORY_CARE_PROVIDER_SITE_OTHER): Payer: Medicaid Other | Admitting: Obstetrics & Gynecology

## 2021-08-16 ENCOUNTER — Other Ambulatory Visit: Payer: Self-pay

## 2021-08-16 VITALS — BP 98/66 | HR 86 | Ht 64.5 in | Wt 184.0 lb

## 2021-08-16 DIAGNOSIS — N921 Excessive and frequent menstruation with irregular cycle: Secondary | ICD-10-CM

## 2021-08-16 DIAGNOSIS — D689 Coagulation defect, unspecified: Secondary | ICD-10-CM

## 2021-08-16 DIAGNOSIS — T457X1A Poisoning by anticoagulant antagonists, vitamin K and other coagulants, accidental (unintentional), initial encounter: Secondary | ICD-10-CM

## 2021-08-16 MED ORDER — MEGESTROL ACETATE 40 MG PO TABS: ORAL_TABLET | ORAL | 4 refills | Status: DC

## 2021-08-16 NOTE — Progress Notes (Signed)
Chief Complaint  Patient presents with   Follow-up    Bleeding is better!!!       36 y.o. G0P0 No LMP recorded. (Menstrual status: Other). The current method of family planning is oral progesterone-only contraceptive.  Outpatient Encounter Medications as of 08/16/2021  Medication Sig   Accu-Chek Softclix Lancets lancets Use as directed up to 4 times daily   acetaminophen (TYLENOL) 500 MG tablet Take 1,000 mg by mouth every 6 (six) hours as needed for mild pain.   apixaban (ELIQUIS) 5 MG TABS tablet Take 1 tablet (5 mg total) by mouth 2 (two) times daily.   Blood Glucose Monitoring Suppl (ACCU-CHEK GUIDE) w/Device KIT Use as directed   dapagliflozin propanediol (FARXIGA) 10 MG TABS tablet Take by mouth daily.   digoxin (LANOXIN) 0.125 MG tablet Take 0.5 tablets (0.0625 mg total) by mouth daily.   Ferrous Sulfate (IRON) 325 (65 Fe) MG TABS Take 2 daily (Patient taking differently: Take 1 daily)   glucose blood (ACCU-CHEK GUIDE) test strip Use as instructed up to 4 times daily   levothyroxine (SYNTHROID) 75 MCG tablet Take 75 mcg by mouth daily.   losartan (COZAAR) 25 MG tablet Take 1 tablet (25 mg total) by mouth at bedtime.   magnesium oxide (MAG-OX) 400 MG tablet Take 1 tablet (400 mg total) by mouth daily.   megestrol (MEGACE) 40 MG tablet 1 tablet daily   metoprolol tartrate (LOPRESSOR) 25 MG tablet Take 1 tablet (25 mg total) by mouth at bedtime.   potassium chloride SA (KLOR-CON M) 20 MEQ tablet Take 1 tablet (20 mEq total) by mouth daily.   promethazine (PHENERGAN) 25 MG tablet Take 1 tablet (25 mg total) by mouth every 6 (six) hours as needed for nausea or vomiting.   spironolactone (ALDACTONE) 25 MG tablet Take 0.5 tablets (12.5 mg total) by mouth daily.   torsemide (DEMADEX) 20 MG tablet Take 1 tablet (20 mg total) by mouth daily.   [DISCONTINUED] megestrol (MEGACE) 40 MG tablet Take 3 x 5 days then 2 x 5 days then 1 daily   No facility-administered encounter  medications on file as of 08/16/2021.    Subjective Pt with history of PE/CHF with compromised EF with menorrhagia anemia due to eliquis therapy Begun on megestrol with good results, no bleeding since starting the megestrol Past Medical History:  Diagnosis Date   Encounter for menstrual regulation 01/24/2015   Hypothyroid    Migraines    Obesity    UTI (urinary tract infection)     Past Surgical History:  Procedure Laterality Date   broken collar bone  1994   RIGHT/LEFT HEART CATH AND CORONARY ANGIOGRAPHY N/A 02/13/2021   Procedure: RIGHT/LEFT HEART CATH AND CORONARY ANGIOGRAPHY;  Surgeon: Larey Dresser, MD;  Location: [facility];  Service: Cardiovascular;  Laterality: N/A;    OB History     Gravida  0   Para      Term      Preterm      AB      Living         SAB      IAB      Ectopic      Multiple      Live Births              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   Sulfa Antibiotics Hives   Ibuprofen Other (  See Comments)    Constipation Can tolerate w milk of mag    Social History   Socioeconomic History   Marital status: Single    Spouse name: Not on file   Number of children: Not on file   Years of education: Not on file   Highest education level: Not on file  Occupational History   Not on file  Tobacco Use   Smoking status: Never   Smokeless tobacco: Never  Vaping Use   Vaping Use: Never used  Substance and Sexual Activity   Alcohol use: No   Drug use: No   Sexual activity: Never  Other Topics Concern   Not on file  Social History Narrative   Not on file   Social Determinants of Health   Financial Resource Strain: Medium Risk   Difficulty of Paying Living Expenses: Somewhat hard  Food Insecurity: No Food Insecurity   Worried About Running Out of Food in the Last Year: Never true   Ran Out of Food in the Last Year: Never true  Transportation Needs: No  Transportation Needs   Lack of Transportation (Medical): No   Lack of Transportation (Non-Medical): No  Physical Activity: Not on file  Stress: Not on file  Social Connections: Not on file    Family History  Problem Relation Age of Onset   Hypertension Paternal Grandmother    Coronary artery disease Paternal Grandmother    Hypertension Maternal Grandmother    Diabetes Maternal Grandmother    Hyperlipidemia Maternal Grandmother    Thyroid disease Maternal Grandmother    Kidney disease Maternal Grandmother    Diabetes Father    Hypertension Mother    Hyperlipidemia Mother    COPD Paternal Grandfather    Heart disease Maternal Grandfather    Diabetes Maternal Grandfather    Hypertension Brother    Hyperlipidemia Brother     Medications:       Current Outpatient Medications:    Accu-Chek Softclix Lancets lancets, Use as directed up to 4 times daily, Disp: 100 each, Rfl: 5   acetaminophen (TYLENOL) 500 MG tablet, Take 1,000 mg by mouth every 6 (six) hours as needed for mild pain., Disp: , Rfl:    apixaban (ELIQUIS) 5 MG TABS tablet, Take 1 tablet (5 mg total) by mouth 2 (two) times daily., Disp: 60 tablet, Rfl: 1   Blood Glucose Monitoring Suppl (ACCU-CHEK GUIDE) w/Device KIT, Use as directed, Disp: 1 kit, Rfl: 0   dapagliflozin propanediol (FARXIGA) 10 MG TABS tablet, Take by mouth daily., Disp: , Rfl:    digoxin (LANOXIN) 0.125 MG tablet, Take 0.5 tablets (0.0625 mg total) by mouth daily., Disp: 45 tablet, Rfl: 2   Ferrous Sulfate (IRON) 325 (65 Fe) MG TABS, Take 2 daily (Patient taking differently: Take 1 daily), Disp: 30 tablet, Rfl: 0   glucose blood (ACCU-CHEK GUIDE) test strip, Use as instructed up to 4 times daily, Disp: 100 each, Rfl: 12   levothyroxine (SYNTHROID) 75 MCG tablet, Take 75 mcg by mouth daily., Disp: , Rfl:    losartan (COZAAR) 25 MG tablet, Take 1 tablet (25 mg total) by mouth at bedtime., Disp: 30 tablet, Rfl: 6   magnesium oxide (MAG-OX) 400 MG tablet, Take  1 tablet (400 mg total) by mouth daily., Disp: 30 tablet, Rfl: 11   megestrol (MEGACE) 40 MG tablet, 1 tablet daily, Disp: 30 tablet, Rfl: 4   metoprolol tartrate (LOPRESSOR) 25 MG tablet, Take 1 tablet (25 mg total) by mouth at bedtime., Disp: 30  tablet, Rfl: 6   potassium chloride SA (KLOR-CON M) 20 MEQ tablet, Take 1 tablet (20 mEq total) by mouth daily., Disp: 90 tablet, Rfl: 2   promethazine (PHENERGAN) 25 MG tablet, Take 1 tablet (25 mg total) by mouth every 6 (six) hours as needed for nausea or vomiting., Disp: 30 tablet, Rfl: 0   spironolactone (ALDACTONE) 25 MG tablet, Take 0.5 tablets (12.5 mg total) by mouth daily., Disp: 15 tablet, Rfl: 11   torsemide (DEMADEX) 20 MG tablet, Take 1 tablet (20 mg total) by mouth daily., Disp: 90 tablet, Rfl: 2  Objective Blood pressure 98/66, pulse 86, height 5' 4.5" (1.638 m), weight 184 lb (83.5 kg).  Gen WDWN NAD  Pertinent ROS No burning with urination, frequency or urgency No nausea, vomiting or diarrhea Nor fever chills or other constitutional symptoms   Labs or studies reviewed    Impression Diagnoses this Encounter::   ICD-10-CM   1. Menorrhagia with irregular cycle  N92.1     2. Hemorrhage secondary to anti-coagulation ([place])  T45.7X1A    D68.9       Established relevant diagnosis(es):   Plan/Recommendations: Meds ordered this encounter  Medications   megestrol (MEGACE) 40 MG tablet    Sig: 1 tablet daily    Dispense:  30 tablet    Refill:  4    Labs or Scans Ordered: No orders of the defined types were placed in this encounter.   Management:: Continue megestrol 40 daily for next 3 months then consider cyclical therapy for management, may need IUD as well if still to heavy  Follow up Return in about 3 months (around 11/14/2021) for Follow up, with Dr Elonda Husky.      All questions were answered.

## 2021-08-21 ENCOUNTER — Other Ambulatory Visit (HOSPITAL_COMMUNITY): Payer: Self-pay | Admitting: Cardiology

## 2021-08-21 ENCOUNTER — Other Ambulatory Visit (HOSPITAL_COMMUNITY): Payer: Self-pay | Admitting: *Deleted

## 2021-08-21 DIAGNOSIS — Z95810 Presence of automatic (implantable) cardiac defibrillator: Secondary | ICD-10-CM

## 2021-08-21 DIAGNOSIS — I5022 Chronic systolic (congestive) heart failure: Secondary | ICD-10-CM

## 2021-08-21 DIAGNOSIS — I493 Ventricular premature depolarization: Secondary | ICD-10-CM

## 2021-08-29 ENCOUNTER — Other Ambulatory Visit: Payer: Self-pay | Admitting: Internal Medicine

## 2021-08-31 ENCOUNTER — Encounter: Payer: Self-pay | Admitting: Internal Medicine

## 2021-08-31 ENCOUNTER — Other Ambulatory Visit: Payer: Self-pay

## 2021-08-31 ENCOUNTER — Ambulatory Visit: Payer: Medicaid Other | Admitting: Internal Medicine

## 2021-08-31 VITALS — BP 112/64 | HR 95 | Ht 64.5 in | Wt 184.8 lb

## 2021-08-31 DIAGNOSIS — I493 Ventricular premature depolarization: Secondary | ICD-10-CM

## 2021-08-31 NOTE — Progress Notes (Signed)
HPI [name] returns today for followup. She is a pleasant 36 yo woman with a h/o non-ischemic CM and chronic systolic heart failure diagnosed in June. She has been treated with GDMT and is pending a repeat 2D echo to evaluation of her LV function in March. She has not had syncope and has not had any Life Vest therapies. 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     apixaban (ELIQUIS) 5 MG TABS tablet Take 1 tablet (5 mg total) by mouth 2 (two) times daily. 60 tablet 1   Blood Glucose Monitoring Suppl (ACCU-CHEK GUIDE) w/Device KIT Use as directed 1 kit 0   dapagliflozin propanediol (FARXIGA) 10 MG TABS tablet Take by mouth daily.     digoxin (LANOXIN) 0.125 MG tablet Take 0.5 tablets (0.0625 mg total) by mouth daily. 45 tablet 2   Ferrous Sulfate (IRON) 325 (65 Fe) MG TABS Take 2 daily (Patient taking differently: Take 1 daily) 30 tablet 0   glucose blood (ACCU-CHEK GUIDE) test strip Use as instructed up to 4 times daily 100 each 12   levothyroxine (SYNTHROID) 75 MCG tablet Take 75 mcg by mouth daily.     losartan (COZAAR) 25 MG tablet Take 1 tablet (25 mg total) by mouth at bedtime. 30 tablet 6   magnesium oxide (MAG-OX) 400 MG tablet Take 1 tablet (400 mg total) by mouth daily. 30 tablet 11   megestrol (MEGACE) 40 MG tablet 1 tablet daily 30 tablet 4   metoprolol tartrate (LOPRESSOR) 25 MG tablet Take 1 tablet (25 mg total) by mouth at bedtime. 30 tablet 6   potassium chloride SA (KLOR-CON M) 20 MEQ tablet Take 1 tablet (20 mEq total) by mouth daily. 90 tablet 2    promethazine (PHENERGAN) 25 MG tablet Take 1 tablet (25 mg total) by mouth every 6 (six) hours as needed for nausea or vomiting. 30 tablet 0   spironolactone (ALDACTONE) 25 MG tablet Take 0.5 tablets (12.5 mg total) by mouth daily. 15 tablet 11   torsemide (DEMADEX) 20 MG tablet Take 1 tablet (20 mg total) by mouth daily. 90 tablet 2   No current facility-administered medications for this visit.     Past Medical History:  Diagnosis Date   Encounter for menstrual regulation 01/24/2015   Hypothyroid    Migraines    Obesity    UTI (urinary tract infection)     ROS:   All systems reviewed and negative except as noted in the HPI.   Past Surgical History:  Procedure Laterality Date   broken collar bone  1994   RIGHT/LEFT HEART CATH AND CORONARY ANGIOGRAPHY N/A 02/13/2021   Procedure: RIGHT/LEFT HEART CATH AND CORONARY ANGIOGRAPHY;  Surgeon: Larey Dresser, MD;  Location: [facility];  Service: Cardiovascular;  Laterality: N/A;     Family History  Problem Relation Age of Onset   Hypertension Paternal Grandmother    Coronary artery disease Paternal Grandmother    Hypertension Maternal Grandmother    Diabetes Maternal Grandmother  Hyperlipidemia Maternal Grandmother    Thyroid disease Maternal Grandmother    Kidney disease Maternal Grandmother    Diabetes Father    Hypertension Mother    Hyperlipidemia Mother    COPD Paternal Grandfather    Heart disease Maternal Grandfather    Diabetes Maternal Grandfather    Hypertension Brother    Hyperlipidemia Brother      Social History   Socioeconomic History   Marital status: Single    Spouse name: Not on file   Number of children: Not on file   Years of education: Not on file   Highest education level: Not on file  Occupational History   Not on file  Tobacco Use   Smoking status: Never   Smokeless tobacco: Never  Vaping Use   Vaping Use: Never used  Substance and Sexual Activity   Alcohol use: No   Drug use:  No   Sexual activity: Never  Other Topics Concern   Not on file  Social History Narrative   Not on file   Social Determinants of Health   Financial Resource Strain: Medium Risk   Difficulty of Paying Living Expenses: Somewhat hard  Food Insecurity: No Food Insecurity   Worried About Charity fundraiser in the Last Year: Never true   Ran Out of Food in the Last Year: Never true  Transportation Needs: No Transportation Needs   Lack of Transportation (Medical): No   Lack of Transportation (Non-Medical): No  Physical Activity: Not on file  Stress: Not on file  Social Connections: Not on file  Intimate Partner Violence: Not on file     BP 112/64    Pulse 95    Ht 5' 4.5" (1.638 m)    Wt 184 lb 12.8 oz (83.8 kg)    SpO2 99%    BMI 31.23 kg/m   Physical Exam:  Well appearing NAD HEENT: Unremarkable Neck:  No JVD, no thyromegally Lymphatics:  No adenopathy Back:  No CVA tenderness Lungs:  Clear with no wheezes HEART:  Regular rate rhythm, soft systolic murmurs, no rubs, no clicks Abd:  soft, positive bowel sounds, no organomegally, no rebound, no guarding Ext:  2 plus pulses, no edema, no cyanosis, no clubbing Skin:  No rashes no nodules Neuro:  CN II through XII intact, motor grossly intact   Assess/Plan:  Chronic systolic heart failure - she appears to be on GDMT. She will undergo a 2D echo in March. If her EF is above 35%, we will not recommend ICD and if less than 35% we will recommend proceeding with ICD insertion. I have reviewed the indications/risks/benefits/goals/expectations of ICD insertion and she will call us if her EF is down and she would like to proceed with ICD insertion. MR/TR - not severe by exam today. We will follow. NSVT - stable. No Life Vest therapies. LV thrombus - She will continue Apixaban.   Carleene Overlie Sherel Fennell,MD

## 2021-08-31 NOTE — Patient Instructions (Addendum)
Medication Instructions:  Your physician recommends that you continue on your current medications as directed. Please refer to the Current Medication list given to you today.  Labwork: None ordered.  Testing/Procedures: None ordered.  Follow-Up: Your physician wants you to follow-up based on results of your ECHO   Any Other Special Instructions Will Be Listed Below (If Applicable).  If you need a refill on your cardiac medications before your next appointment, please call your pharmacy.

## 2021-09-25 ENCOUNTER — Encounter: Payer: Self-pay | Admitting: Obstetrics & Gynecology

## 2021-10-22 ENCOUNTER — Ambulatory Visit (HOSPITAL_BASED_OUTPATIENT_CLINIC_OR_DEPARTMENT_OTHER)
Admission: RE | Admit: 2021-10-22 | Discharge: 2021-10-22 | Disposition: A | Discharge status: Home / Self Care | Payer: Medicaid Other | Source: Ambulatory Visit

## 2021-10-22 ENCOUNTER — Ambulatory Visit (HOSPITAL_COMMUNITY)
Admission: RE | Admit: 2021-10-22 | Discharge: 2021-10-22 | Disposition: A | Discharge status: Home / Self Care | Payer: Medicaid Other | Source: Ambulatory Visit | Attending: Internal Medicine | Admitting: Internal Medicine

## 2021-10-22 ENCOUNTER — Other Ambulatory Visit: Payer: Self-pay

## 2021-10-22 ENCOUNTER — Encounter (HOSPITAL_COMMUNITY): Payer: Self-pay | Admitting: Internal Medicine

## 2021-10-22 VITALS — BP 124/68 | HR 88 | Wt 189.8 lb

## 2021-10-22 DIAGNOSIS — I513 Intracardiac thrombosis, not elsewhere classified: Secondary | ICD-10-CM

## 2021-10-22 DIAGNOSIS — Z8679 Personal history of other diseases of the circulatory system: Secondary | ICD-10-CM

## 2021-10-22 DIAGNOSIS — I700 Atherosclerosis of aorta: Secondary | ICD-10-CM | POA: Diagnosis not present

## 2021-10-22 DIAGNOSIS — I5022 Chronic systolic (congestive) heart failure: Secondary | ICD-10-CM

## 2021-10-22 DIAGNOSIS — I358 Other nonrheumatic aortic valve disorders: Secondary | ICD-10-CM | POA: Diagnosis not present

## 2021-10-22 DIAGNOSIS — I340 Nonrheumatic mitral (valve) insufficiency: Secondary | ICD-10-CM | POA: Diagnosis not present

## 2021-10-22 LAB — ECHOCARDIOGRAM COMPLETE
AR max vel: 2.84 cm2
AV Peak grad: 7.2 mmHg
Ao pk vel: 1.34 m/s
Area-P 1/2: 3.91 cm2
S' Lateral: 5.3 cm

## 2021-10-22 LAB — BASIC METABOLIC PANEL
Anion gap: 11 (ref 5–15)
BUN: 10 mg/dL (ref 6–20)
CO2: 25 mmol/L (ref 22–32)
Calcium: 9.2 mg/dL (ref 8.9–10.3)
Chloride: 104 mmol/L (ref 98–111)
Creatinine, Ser: 0.86 mg/dL (ref 0.44–1.00)
GFR, Estimated: >60 mL/min (ref >60)
Glucose, Bld: 100 mg/dL — ABNORMAL HIGH (ref 70–99)
Potassium: 3.6 mmol/L (ref 3.5–5.1)
Sodium: 140 mmol/L (ref 135–145)

## 2021-10-22 LAB — BRAIN NATRIURETIC PEPTIDE: B Natriuretic Peptide: 71.4 pg/mL (ref 0.0–100.0)

## 2021-10-22 MED ORDER — LOSARTAN POTASSIUM 50 MG PO TABS: 50.0000 mg | ORAL_TABLET | Freq: Every day | ORAL | 11 refills | Status: DC

## 2021-10-22 NOTE — Progress Notes (Incomplete)
Advanced Heart Failure Clinic Note    PCP: [facility] Dept. PCP-Cardiologist: None  HF Cardiologist: Dr. Haroldine Laws  HPI: Debra Daniel is a 36 y.o. WF with no significant medical history until recently. Newly diagnosed with systolic heart failure, nonischemic cardiomyopathy, DM II, hypothyroidism, LV thrombus & PE on Eliquis.    Presented to the ED in [facility] 02/05/21 w/ hemoptysis.  CTA chest/abdomen/pelvis with acute RLL segmental and subsegmental PE, consolidation in bilateral lower lobes, diffuse anasarca. OCPs were stopped. Echo showed EF of 25-30%, moderate to severe MR, moderate TR. She was transferred to [facility] for additional pulmonary and cardiac evaluation.   Stat echo at [facility] showed LVEF of 10-15% with mod dilated LV, severe secondary MR, moderate to severe RV dysfunction with moderately enlarged RV, severe TR. She developed cardiogenic shock and was placed on milrinone, and briefly on low-dose NE. She was diuresed with IV Lasix. She was started on amiodarone for NSVT. She underwent L/RHC (02/13/21) showing no CAD. She developed PMVT requiring shock x 2 and brief CPR. QTc long, amio stopped. Sustained another episode of PMVT, ivabradine stopped. She was transitioned to oral diuretics and GDMT initiated. Hospitalization c/b elevated LFTs, anemia, and PNA. She was fitted for LifeVest. Discharge weight 199 lbs.  Today she returns for HF follow up with her mother.  Walk 1.5-2.0 miles per day. No CP, SOB, edema, orthopnea or PND. Compliant with all meds. SBP 90-106. HR 90s at home  Wearing LifeVest  Echo today 10/22/21: EF 20% RV moderately HK. G1DD. Trivial MR Personally reviewed    Echo 07/16/21 EF 20-25% RV normal. Mild to mod MR. Mild TR Personally reviewed   Cardiac Studies: - Echo (6/22): EF 10-15%, RV moderately enlarged with severely reduced systolic function, RVSP 41.7 mmHg, severe secondary MR, mod-severe TR.  - R/LHC (7/22): No CAD, with CI 2.48 on milrinone 0.25. Mildly  elevated filling pressures, pulmonary venous hypertension.  RHC Procedural Findings (on milrinone 0.25): Hemodynamics (mmHg) RA mean 9 RV 52/17 PA 53/17, mean 31 PCWP mean 16 LV 90/14 AO 90/63  Oxygen saturations: PA 57% AO 98%  Cardiac Output (Fick) 5.1  Cardiac Index (Fick) 2.48  PVR 2.9 WU   - cMRI (7/22): LVEF 18%, diffuse hypokinesis, LV thrombus, mod dilated RV with EF 15%, mild MR, subtle non-coronary mid-wall LGE pattern in the inferior and anterolateral walls, possible prior myocarditis.  Past Medical History:  Diagnosis Date   Encounter for menstrual regulation 01/24/2015   Hypothyroid    Migraines    Obesity    UTI (urinary tract infection)    Current Outpatient Medications  Medication Sig Dispense Refill   Accu-Chek Softclix Lancets lancets Use as directed up to 4 times daily 100 each 5   acetaminophen (TYLENOL) 500 MG tablet Take 1,000 mg by mouth every 6 (six) hours as needed for mild pain.     apixaban (ELIQUIS) 5 MG TABS tablet Take 1 tablet (5 mg total) by mouth 2 (two) times daily. 60 tablet 1   Blood Glucose Monitoring Suppl (ACCU-CHEK GUIDE) w/Device KIT Use as directed 1 kit 0   dapagliflozin propanediol (FARXIGA) 10 MG TABS tablet Take by mouth daily.     digoxin (LANOXIN) 0.125 MG tablet Take 0.5 tablets (0.0625 mg total) by mouth daily. 45 tablet 2   glucose blood (ACCU-CHEK GUIDE) test strip Use as instructed up to 4 times daily 100 each 12   levothyroxine (SYNTHROID) 75 MCG tablet Take 75 mcg by mouth daily.     losartan (COZAAR)  25 MG tablet Take 1 tablet (25 mg total) by mouth at bedtime. 30 tablet 6   magnesium oxide (MAG-OX) 400 MG tablet Take 1 tablet (400 mg total) by mouth daily. 30 tablet 11   megestrol (MEGACE) 40 MG tablet 1 tablet daily 30 tablet 4   metoprolol tartrate (LOPRESSOR) 25 MG tablet Take 1 tablet (25 mg total) by mouth at bedtime. 30 tablet 6   potassium chloride SA (KLOR-CON M) 20 MEQ tablet Take 1 tablet (20 mEq total) by  mouth daily. 90 tablet 2   promethazine (PHENERGAN) 25 MG tablet Take 1 tablet (25 mg total) by mouth every 6 (six) hours as needed for nausea or vomiting. 30 tablet 0   spironolactone (ALDACTONE) 25 MG tablet Take 0.5 tablets (12.5 mg total) by mouth daily. 15 tablet 11   torsemide (DEMADEX) 20 MG tablet Take 1 tablet (20 mg total) by mouth daily. 90 tablet 2   No current facility-administered medications for this encounter.   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   Sulfa Antibiotics Hives   Ibuprofen Other (See Comments)    Constipation Can tolerate w milk of mag    Social History   Socioeconomic History   Marital status: Single    Spouse name: Not on file   Number of children: Not on file   Years of education: Not on file   Highest education level: Not on file  Occupational History   Not on file  Tobacco Use   Smoking status: Never   Smokeless tobacco: Never  Vaping Use   Vaping Use: Never used  Substance and Sexual Activity   Alcohol use: No   Drug use: No   Sexual activity: Never  Other Topics Concern   Not on file  Social History Narrative   Not on file   Social Determinants of Health   Financial Resource Strain: Medium Risk   Difficulty of Paying Living Expenses: Somewhat hard  Food Insecurity: No Food Insecurity   Worried About Running Out of Food in the Last Year: Never true   Ran Out of Food in the Last Year: Never true  Transportation Needs: No Transportation Needs   Lack of Transportation (Medical): No   Lack of Transportation (Non-Medical): No  Physical Activity: Not on file  Stress: Not on file  Social Connections: Not on file  Intimate Partner Violence: Not on file   Family History  Problem Relation Age of Onset   Hypertension Paternal Grandmother    Coronary artery disease Paternal Grandmother    Hypertension Maternal Grandmother    Diabetes Maternal Grandmother    Hyperlipidemia  Maternal Grandmother    Thyroid disease Maternal Grandmother    Kidney disease Maternal Grandmother    Diabetes Father    Hypertension Mother    Hyperlipidemia Mother    COPD Paternal Grandfather    Heart disease Maternal Grandfather    Diabetes Maternal Grandfather    Hypertension Brother    Hyperlipidemia Brother    BP 124/68    Pulse 88    Wt 86.1 kg (189 lb 12.8 oz)    SpO2 99%    BMI 32.08 kg/m   Wt Readings from Last 3 Encounters:  10/22/21 86.1 kg (189 lb 12.8 oz)  08/31/21 83.8 kg (184 lb 12.8 oz)  08/16/21 83.5 kg (184 lb)   PHYSICAL EXAM: General:  Well appearing. No resp difficulty HEENT: normal Neck: supple. no JVD. Carotids  2+ bilat; no bruits. No lymphadenopathy or thryomegaly appreciated. Cor: PMI nondisplaced. Regular rate & rhythm. No rubs, gallops or murmurs. Lungs: clear Abdomen: soft, nontender, nondistended. No hepatosplenomegaly. No bruits or masses. Good bowel sounds. Extremities: no cyanosis, clubbing, rash, edema Neuro: alert & orientedx3, cranial nerves grossly intact. moves all 4 extremities w/o difficulty. Affect pleasant   LifeVest Interrogation: No treatments, average HR 99, daily steps 1969, SR with occasional PVC (personally reviewed).  Bedside echo today (05/04/21): 20-25% (performed by Dr. Haroldine Laws today.)  ASSESSMENT & PLAN: 1. Chronic systolic HF  - New diagnosis of CHF 6/22 admission. ? viral myocarditis vs familial cardiomyopathy (grandmother and aunts/uncles with CHF).   - Echo (6/22): EF of 10-15%, moderate to severely reduced RV fxn with mildly dilated RV, RVSP 45 mmHg, severe secondary MR, mod-severe TR. - cMRI (7/22): LV EF 18%, RV EF 26%, LV thrombus, Subtle non-coronary mid-wall LGE pattern in the inferior and anterolateral walls, possible prior myocarditis. - R/LHC (7/22): No CAD, with CI 2.48 on milrinone 0.25.   - Echo 07/16/21 EF 20-25% LV smaller Mild to mod MR. Mild TR - Echo today 10/22/21 EF 20% Mildly dilated RV mod HK.  G1DD. Triv MR Personally reviewed - Stable  NYHA II. Volume status looks good - ContinueToprol 25 mg qhs - Continue torsemide 20 mg daily. - Increase losartan to 25 mg daily (BP did not tolerate Entresto). - Continue spironolactone 12.5 mg daily. - Continue dapagliflozin 10 daily. - Continue digoxin 0.0625 mg daily. - Off ivabradine with PMVT. - Continue LifeVest - Labs today - Refer to Dr. Broadus John for Genetics Screening   2. Mitral valve regurgitation - Severe on echo this past admission, only looked mild on cMRI however. - MR trivial on echo today   3. Moderate-severe TR - Trivial on echo today    4. NSVT  - She is off amiodarone and ivabradine w/ prolonged QTc and subsequent PMVT. - LifeVest on. Recent QTc 422 ms. LifeVest interrogated. No VT.   6. LV thrombus - No clot on echo today - On apixaban.  - No bleeding issues.    Glori Bickers, MD 10/22/21  11:45 AM

## 2021-10-22 NOTE — Progress Notes (Signed)
Echocardiogram ?2D Echocardiogram has been performed. ? ?Eduard Roux ?10/22/2021, 10:46 AM ?

## 2021-10-22 NOTE — Patient Instructions (Signed)
Medication Changes: ? ?Increase Losartan to 50 mg daily ? ?Lab Work: ? ?Labs done today, your results will be available in MyChart, we will contact you for abnormal readings. ? ? ?Testing/Procedures: ? ?none ? ?Referrals: ? ?Please arrange a follow up appointment with Dr. Lovena Le to discuss ICD  ? ?Special Instructions // Education: ? ?none ? ?Follow-Up in: 6 months (August 2023) ** please call office to arrange your follow up in August ** ? ?At the [facility], you and your health needs are our priority. We have a designated team specialized in the treatment of Heart Failure. This Care Team includes your primary Heart Failure Specialized Cardiologist (physician), Advanced Practice Providers (APPs- Physician Assistants and Nurse Practitioners), and Pharmacist who all work together to provide you with the care you need, when you need it.  ? ?You may see any of the following providers on your designated Care Team at your next follow up: ? ?Dr Glori Bickers ?Dr Loralie Champagne ?Darrick Grinder, NP ?Lyda Jester, PA ?Jessica Milford,NP ?Marlyce Huge, PA ?Audry Riles, PharmD ? ? ?Please be sure to bring in all your medications bottles to every appointment.  ? ?Need to Contact us: ? ?If you have any questions or concerns before your next appointment please send [place] a message through [place] or call our office at [phone].   ? ?TO LEAVE A MESSAGE FOR THE NURSE SELECT OPTION 2, PLEASE LEAVE A MESSAGE INCLUDING: ?YOUR NAME ?DATE OF BIRTH ?CALL BACK NUMBER ?REASON FOR CALL**this is important as we prioritize the call backs ? ?YOU WILL RECEIVE A CALL BACK THE SAME DAY AS LONG AS YOU CALL BEFORE 4:00 PM ? ? ?

## 2021-10-23 ENCOUNTER — Encounter: Payer: Medicaid Other | Admitting: Genetic Counselor

## 2021-10-24 ENCOUNTER — Other Ambulatory Visit (HOSPITAL_COMMUNITY): Payer: Self-pay

## 2021-10-24 MED ORDER — DAPAGLIFLOZIN PROPANEDIOL 10 MG PO TABS: 10.0000 mg | ORAL_TABLET | Freq: Every day | ORAL | 0 refills | Status: DC

## 2021-11-15 ENCOUNTER — Ambulatory Visit: Payer: Medicaid Other | Admitting: Obstetrics & Gynecology

## 2021-11-15 ENCOUNTER — Encounter: Payer: Self-pay | Admitting: Obstetrics & Gynecology

## 2021-11-15 VITALS — BP 100/70 | HR 85 | Ht 64.5 in | Wt 194.0 lb

## 2021-11-15 DIAGNOSIS — N921 Excessive and frequent menstruation with irregular cycle: Secondary | ICD-10-CM | POA: Diagnosis not present

## 2021-11-15 DIAGNOSIS — D500 Iron deficiency anemia secondary to blood loss (chronic): Secondary | ICD-10-CM

## 2021-11-15 DIAGNOSIS — Z86711 Personal history of pulmonary embolism: Secondary | ICD-10-CM | POA: Diagnosis not present

## 2021-11-15 MED ORDER — MEGESTROL ACETATE 40 MG PO TABS: 40.0000 mg | ORAL_TABLET | Freq: Every day | ORAL | 11 refills | Status: DC

## 2021-11-15 NOTE — Progress Notes (Signed)
Follow up appointment for clinical response: ?Continuous megestrol for management of menorrhagia due to chronic anti coagulation ? ?Chief Complaint  ?Patient presents with  ? Follow-up  ? ? ?Blood pressure 100/70, pulse 85, height 5' 4.5" (1.638 m), weight 194 lb (88 kg). ? ?Pt has had infrequent spotting, no significant bleeding, remains on eliquis ? ? ? ?MEDS ordered this encounter: ?Meds ordered this encounter  ?Medications  ? megestrol (MEGACE) 40 MG tablet  ?  Sig: Take 1 tablet (40 mg total) by mouth daily. Begin the 16th of each month  ?  Dispense:  24 tablet  ?  Refill:  11  ? ? ?Orders for this encounter: ?No orders of the defined types were placed in this encounter. ? ? ?Impression + Management Plan ?  ICD-10-CM   ?1. Menorrhagia with irregular cycle  N92.1   ? has done well with daily megestrol, will stop x 10 days, allow endometrial sloughing, then begin cyclical megestrol 24 days on starting the 16th of each month  ?  ?2. Iron deficiency anemia due to chronic blood loss  D50.0   ? should improve with menstrual management hoepfully  ?  ?3. History of pulmonary embolus (PE)  Z86.711   ? on eliquis 5 mg daily  ?  ? ? ?Follow Up: ?Return in about 4 months (around 03/17/2022) for Follow up, with Dr Elonda Husky. ? ? ? ? All questions were answered. ? ?Past Medical History:  ?Diagnosis Date  ? Encounter for menstrual regulation 01/24/2015  ? Hypothyroid   ? Migraines   ? Obesity   ? UTI (urinary tract infection)   ? ? ?Past Surgical History:  ?Procedure Laterality Date  ? broken collar bone  1994  ? RIGHT/LEFT HEART CATH AND CORONARY ANGIOGRAPHY N/A 02/13/2021  ? Procedure: RIGHT/LEFT HEART CATH AND CORONARY ANGIOGRAPHY;  Surgeon: Larey Dresser, MD;  Location: [facility];  Service: Cardiovascular;  Laterality: N/A;  ? ? ?OB History   ? ? Gravida  ?0  ? Para  ?   ? Term  ?   ? Preterm  ?   ? AB  ?   ? Living  ?   ?  ? ? SAB  ?   ? IAB  ?   ? Ectopic  ?   ? Multiple  ?   ? Live Births  ?   ?   ?  ?   ? ? ?Allergies  ?Allergen Reactions  ? Heparin Anaphylaxis  ?  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 Sulfa Antibiotics Hives  ? Ibuprofen Other (See Comments)  ?  Constipation ?Can tolerate w milk of mag  ? ? ?Social History  ? ?Socioeconomic History  ? Marital status: Single  ?  Spouse name: Not on file  ? Number of children: Not on file  ? Years of education: Not on file  ? Highest education level: Not on file  ?Occupational History  ? Not on file  ?Tobacco Use  ? Smoking status: Never  ? Smokeless tobacco: Never  ?Vaping Use  ? Vaping Use: Never used  ?Substance and Sexual Activity  ? Alcohol use: No  ? Drug use: No  ? Sexual activity: Never  ?Other Topics Concern  ? Not on file  ?Social History Narrative  ? Not on file  ? ?Social Determinants of Health  ? ?Financial Resource Strain: Medium Risk  ? Difficulty of Paying Living Expenses: Somewhat hard  ?Food Insecurity: No Food Insecurity  ?  Worried About Charity fundraiser in the Last Year: Never true  ? Ran Out of Food in the Last Year: Never true  ?Transportation Needs: No Transportation Needs  ? Lack of Transportation (Medical): No  ? Lack of Transportation (Non-Medical): No  ?Physical Activity: Not on file  ?Stress: Not on file  ?Social Connections: Not on file  ? ? ?Family History  ?Problem Relation Age of Onset  ? Hypertension Paternal Grandmother   ? Coronary artery disease Paternal Grandmother   ? Hypertension Maternal Grandmother   ? Diabetes Maternal Grandmother   ? Hyperlipidemia Maternal Grandmother   ? Thyroid disease Maternal Grandmother   ? Kidney disease Maternal Grandmother   ? Diabetes Father   ? Hypertension Mother   ? Hyperlipidemia Mother   ? COPD Paternal Grandfather   ? Heart disease Maternal Grandfather   ? Diabetes Maternal Grandfather   ? Hypertension Brother   ? Hyperlipidemia Brother   ? ? ?

## 2021-11-19 ENCOUNTER — Encounter: Payer: Self-pay | Admitting: Internal Medicine

## 2021-11-19 ENCOUNTER — Ambulatory Visit: Payer: Medicaid Other | Admitting: Internal Medicine

## 2021-11-19 DIAGNOSIS — I5022 Chronic systolic (congestive) heart failure: Secondary | ICD-10-CM

## 2021-11-19 LAB — BASIC METABOLIC PANEL
BUN/Creatinine Ratio: 22 (ref 9–23)
BUN: 19 mg/dL (ref 6–20)
CO2: 25 mmol/L (ref 20–29)
Calcium: 9.6 mg/dL (ref 8.7–10.2)
Chloride: 103 mmol/L (ref 96–106)
Creatinine, Ser: 0.87 mg/dL (ref 0.57–1.00)
Glucose: 96 mg/dL (ref 70–99)
Potassium: 3.8 mmol/L (ref 3.5–5.2)
Sodium: 141 mmol/L (ref 134–144)
eGFR: 90 mL/min/{1.73_m2} (ref >59)

## 2021-11-19 LAB — CBC WITH DIFFERENTIAL/PLATELET
Basophils Absolute: 0.1 10*3/uL (ref 0.0–0.2)
Basos: 1 %
EOS (ABSOLUTE): 0.7 10*3/uL — ABNORMAL HIGH (ref 0.0–0.4)
Eos: 6 %
Hematocrit: 41 % (ref 34.0–46.6)
Hemoglobin: 13.4 g/dL (ref 11.1–15.9)
Immature Grans (Abs): 0 10*3/uL (ref 0.0–0.1)
Immature Granulocytes: 0 %
Lymphocytes Absolute: 4.1 10*3/uL — ABNORMAL HIGH (ref 0.7–3.1)
Lymphs: 35 %
MCH: 26.5 pg — ABNORMAL LOW (ref 26.6–33.0)
MCHC: 32.7 g/dL (ref 31.5–35.7)
MCV: 81 fL (ref 79–97)
Monocytes Absolute: 0.7 10*3/uL (ref 0.1–0.9)
Monocytes: 6 %
Neutrophils Absolute: 6.2 10*3/uL (ref 1.4–7.0)
Neutrophils: 52 %
Platelets: 354 10*3/uL (ref 150–450)
RBC: 5.06 x10E6/uL (ref 3.77–5.28)
RDW: 15.3 % (ref 11.7–15.4)
WBC: 11.9 10*3/uL — ABNORMAL HIGH (ref 3.4–10.8)

## 2021-11-19 NOTE — H&P (View-Only) (Signed)
? ? ? ? ?HPI ?Debra Daniel returns today for followup. She is a pleasant 36 yo woman with a h/o non-ischemic CM and chronic systolic heart failure diagnosed in June. She has been treated with GDMT and has undergone repeat 2D echo to evaluation of her LV function in March which demonstrated persistently decreased LV function with an EF of 30%. She has not had syncope and has not had any Life Vest therapies. ?Allergies  ?Allergen Reactions  ? Heparin Anaphylaxis  ?  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 Sulfa Antibiotics Hives  ? Ibuprofen Other (See Comments)  ?  Constipation ?Can tolerate w milk of mag  ? ? ? ?Current Outpatient Medications  ?Medication Sig Dispense Refill  ? Accu-Chek Softclix Lancets lancets Use as directed up to 4 times daily 100 each 5  ? acetaminophen (TYLENOL) 500 MG tablet Take 1,000 mg by mouth every 6 (six) hours as needed for mild pain.    ? apixaban (ELIQUIS) 5 MG TABS tablet Take 1 tablet (5 mg total) by mouth 2 (two) times daily. 60 tablet 1  ? Blood Glucose Monitoring Suppl (ACCU-CHEK GUIDE) w/Device KIT Use as directed 1 kit 0  ? dapagliflozin propanediol (FARXIGA) 10 MG TABS tablet Take 1 tablet (10 mg total) by mouth daily. 90 tablet 0  ? digoxin (LANOXIN) 0.125 MG tablet Take 0.5 tablets (0.0625 mg total) by mouth daily. 45 tablet 2  ? glucose blood (ACCU-CHEK GUIDE) test strip Use as instructed up to 4 times daily 100 each 12  ? levothyroxine (SYNTHROID) 75 MCG tablet Take 75 mcg by mouth daily.    ? losartan (COZAAR) 50 MG tablet Take 1 tablet (50 mg total) by mouth at bedtime. 30 tablet 11  ? magnesium oxide (MAG-OX) 400 MG tablet Take 1 tablet (400 mg total) by mouth daily. 30 tablet 11  ? megestrol (MEGACE) 40 MG tablet Take 1 tablet (40 mg total) by mouth daily. Begin the 16th of each month 24 tablet 11  ? metoprolol tartrate (LOPRESSOR) 25 MG tablet Take 1 tablet (25 mg total) by mouth at bedtime. 30 tablet 6  ? potassium chloride SA (KLOR-CON M) 20 MEQ tablet Take 1  tablet (20 mEq total) by mouth daily. 90 tablet 2  ? promethazine (PHENERGAN) 25 MG tablet Take 1 tablet (25 mg total) by mouth every 6 (six) hours as needed for nausea or vomiting. 30 tablet 0  ? spironolactone (ALDACTONE) 25 MG tablet Take 0.5 tablets (12.5 mg total) by mouth daily. 15 tablet 11  ? torsemide (DEMADEX) 20 MG tablet Take 1 tablet (20 mg total) by mouth daily. 90 tablet 2  ? ?No current facility-administered medications for this visit.  ? ? ? ?Past Medical History:  ?Diagnosis Date  ? Encounter for menstrual regulation 01/24/2015  ? Hypothyroid   ? Migraines   ? Obesity   ? UTI (urinary tract infection)   ? ? ?ROS: ? ? All systems reviewed and negative except as noted in the HPI. ? ? ?Past Surgical History:  ?Procedure Laterality Date  ? broken collar bone  1994  ? RIGHT/LEFT HEART CATH AND CORONARY ANGIOGRAPHY N/A 02/13/2021  ? Procedure: RIGHT/LEFT HEART CATH AND CORONARY ANGIOGRAPHY;  Surgeon: Larey Dresser, MD;  Location: [facility];  Service: Cardiovascular;  Laterality: N/A;  ? ? ? ?Family History  ?Problem Relation Age of Onset  ? Hypertension Paternal Grandmother   ? Coronary artery disease Paternal Grandmother   ? Hypertension Maternal Grandmother   ?  Diabetes Maternal Grandmother   ? Hyperlipidemia Maternal Grandmother   ? Thyroid disease Maternal Grandmother   ? Kidney disease Maternal Grandmother   ? Diabetes Father   ? Hypertension Mother   ? Hyperlipidemia Mother   ? COPD Paternal Grandfather   ? Heart disease Maternal Grandfather   ? Diabetes Maternal Grandfather   ? Hypertension Brother   ? Hyperlipidemia Brother   ? ? ? ?Social History  ? ?Socioeconomic History  ? Marital status: Single  ?  Spouse name: Not on file  ? Number of children: Not on file  ? Years of education: Not on file  ? Highest education level: Not on file  ?Occupational History  ? Not on file  ?Tobacco Use  ? Smoking status: Never  ? Smokeless tobacco: Never  ?Vaping Use  ? Vaping Use: Never used   ?Substance and Sexual Activity  ? Alcohol use: No  ? Drug use: No  ? Sexual activity: Never  ?Other Topics Concern  ? Not on file  ?Social History Narrative  ? Not on file  ? ?Social Determinants of Health  ? ?Financial Resource Strain: Medium Risk  ? Difficulty of Paying Living Expenses: Somewhat hard  ?Food Insecurity: No Food Insecurity  ? Worried About Charity fundraiser in the Last Year: Never true  ? Ran Out of Food in the Last Year: Never true  ?Transportation Needs: No Transportation Needs  ? Lack of Transportation (Medical): No  ? Lack of Transportation (Non-Medical): No  ?Physical Activity: Not on file  ?Stress: Not on file  ?Social Connections: Not on file  ?Intimate Partner Violence: Not on file  ? ? ? ?BP 100/76   Pulse 89   Ht 5' 4.5" (1.638 m)   Wt 195 lb (88.5 kg)   BMI 32.95 kg/m?  ? ?Physical Exam: ? ?Well appearing 36 yo woman, NAD ?HEENT: Unremarkable ?Neck:  No JVD, no thyromegally ?Lymphatics:  No adenopathy ?Back:  No CVA tenderness ?Lungs:  Clear with no wheezes ?HEART:  Regular rate rhythm, no murmurs, no rubs, no clicks ?Abd:  soft, positive bowel sounds, no organomegally, no rebound, no guarding ?Ext:  2 plus pulses, no edema, no cyanosis, no clubbing ?Skin:  No rashes no nodules ?Neuro:  CN II through XII intact, motor grossly intact ? ?EKG - NSR ? ? ?Assess/Plan: Assess/Plan:  ?Chronic systolic heart failure - she appears to be on GDMT. She has undergone a 2D echo in March and her EF is 30%. I have reviewed the indications/risks/benefits/goals/expectations of ICD insertion and she will undergo implant in the coming weeks. ?NSVT - stable. No Life Vest therapies. ?LV thrombus - She will continue Apixaban.  ?Obesity - we will work on weight loss after her device is placed. ?  ?Carleene Overlie Rey Dansby,MD ?

## 2021-11-19 NOTE — Patient Instructions (Addendum)
Medication Instructions:  ?Your physician recommends that you continue on your current medications as directed. Please refer to the Current Medication list given to you today. ? ?Labwork: ?You will get lab work today: BMP and CBC ? ?Testing/Procedures: ?None ordered. ? ?Follow-Up: ? ?SEE INSTRUCTION LETTER ? ?Any Other Special Instructions Will Be Listed Below (If Applicable). ? ?If you need a refill on your cardiac medications before your next appointment, please call your pharmacy.  ? ?ICD information pamphlet given ? ? ?

## 2021-11-19 NOTE — Progress Notes (Signed)
? ? ? ? ?HPI ?Debra Daniel returns today for followup. She is a pleasant 36 yo woman with a h/o non-ischemic CM and chronic systolic heart failure diagnosed in June. She has been treated with GDMT and has undergone repeat 2D echo to evaluation of her LV function in March which demonstrated persistently decreased LV function with an EF of 30%. She has not had syncope and has not had any Life Vest therapies. ?Allergies  ?Allergen Reactions  ? Heparin Anaphylaxis  ?  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 Sulfa Antibiotics Hives  ? Ibuprofen Other (See Comments)  ?  Constipation ?Can tolerate w milk of mag  ? ? ? ?Current Outpatient Medications  ?Medication Sig Dispense Refill  ? Accu-Chek Softclix Lancets lancets Use as directed up to 4 times daily 100 each 5  ? acetaminophen (TYLENOL) 500 MG tablet Take 1,000 mg by mouth every 6 (six) hours as needed for mild pain.    ? apixaban (ELIQUIS) 5 MG TABS tablet Take 1 tablet (5 mg total) by mouth 2 (two) times daily. 60 tablet 1  ? Blood Glucose Monitoring Suppl (ACCU-CHEK GUIDE) w/Device KIT Use as directed 1 kit 0  ? dapagliflozin propanediol (FARXIGA) 10 MG TABS tablet Take 1 tablet (10 mg total) by mouth daily. 90 tablet 0  ? digoxin (LANOXIN) 0.125 MG tablet Take 0.5 tablets (0.0625 mg total) by mouth daily. 45 tablet 2  ? glucose blood (ACCU-CHEK GUIDE) test strip Use as instructed up to 4 times daily 100 each 12  ? levothyroxine (SYNTHROID) 75 MCG tablet Take 75 mcg by mouth daily.    ? losartan (COZAAR) 50 MG tablet Take 1 tablet (50 mg total) by mouth at bedtime. 30 tablet 11  ? magnesium oxide (MAG-OX) 400 MG tablet Take 1 tablet (400 mg total) by mouth daily. 30 tablet 11  ? megestrol (MEGACE) 40 MG tablet Take 1 tablet (40 mg total) by mouth daily. Begin the 16th of each month 24 tablet 11  ? metoprolol tartrate (LOPRESSOR) 25 MG tablet Take 1 tablet (25 mg total) by mouth at bedtime. 30 tablet 6  ? potassium chloride SA (KLOR-CON M) 20 MEQ tablet Take 1  tablet (20 mEq total) by mouth daily. 90 tablet 2  ? promethazine (PHENERGAN) 25 MG tablet Take 1 tablet (25 mg total) by mouth every 6 (six) hours as needed for nausea or vomiting. 30 tablet 0  ? spironolactone (ALDACTONE) 25 MG tablet Take 0.5 tablets (12.5 mg total) by mouth daily. 15 tablet 11  ? torsemide (DEMADEX) 20 MG tablet Take 1 tablet (20 mg total) by mouth daily. 90 tablet 2  ? ?No current facility-administered medications for this visit.  ? ? ? ?Past Medical History:  ?Diagnosis Date  ? Encounter for menstrual regulation 01/24/2015  ? Hypothyroid   ? Migraines   ? Obesity   ? UTI (urinary tract infection)   ? ? ?ROS: ? ? All systems reviewed and negative except as noted in the HPI. ? ? ?Past Surgical History:  ?Procedure Laterality Date  ? broken collar bone  1994  ? RIGHT/LEFT HEART CATH AND CORONARY ANGIOGRAPHY N/A 02/13/2021  ? Procedure: RIGHT/LEFT HEART CATH AND CORONARY ANGIOGRAPHY;  Surgeon: Larey Dresser, MD;  Location: [facility];  Service: Cardiovascular;  Laterality: N/A;  ? ? ? ?Family History  ?Problem Relation Age of Onset  ? Hypertension Paternal Grandmother   ? Coronary artery disease Paternal Grandmother   ? Hypertension Maternal Grandmother   ?  Diabetes Maternal Grandmother   ? Hyperlipidemia Maternal Grandmother   ? Thyroid disease Maternal Grandmother   ? Kidney disease Maternal Grandmother   ? Diabetes Father   ? Hypertension Mother   ? Hyperlipidemia Mother   ? COPD Paternal Grandfather   ? Heart disease Maternal Grandfather   ? Diabetes Maternal Grandfather   ? Hypertension Brother   ? Hyperlipidemia Brother   ? ? ? ?Social History  ? ?Socioeconomic History  ? Marital status: Single  ?  Spouse name: Not on file  ? Number of children: Not on file  ? Years of education: Not on file  ? Highest education level: Not on file  ?Occupational History  ? Not on file  ?Tobacco Use  ? Smoking status: Never  ? Smokeless tobacco: Never  ?Vaping Use  ? Vaping Use: Never used   ?Substance and Sexual Activity  ? Alcohol use: No  ? Drug use: No  ? Sexual activity: Never  ?Other Topics Concern  ? Not on file  ?Social History Narrative  ? Not on file  ? ?Social Determinants of Health  ? ?Financial Resource Strain: Medium Risk  ? Difficulty of Paying Living Expenses: Somewhat hard  ?Food Insecurity: No Food Insecurity  ? Worried About Charity fundraiser in the Last Year: Never true  ? Ran Out of Food in the Last Year: Never true  ?Transportation Needs: No Transportation Needs  ? Lack of Transportation (Medical): No  ? Lack of Transportation (Non-Medical): No  ?Physical Activity: Not on file  ?Stress: Not on file  ?Social Connections: Not on file  ?Intimate Partner Violence: Not on file  ? ? ? ?BP 100/76   Pulse 89   Ht 5' 4.5" (1.638 m)   Wt 195 lb (88.5 kg)   BMI 32.95 kg/m?  ? ?Physical Exam: ? ?Well appearing 36 yo woman, NAD ?HEENT: Unremarkable ?Neck:  No JVD, no thyromegally ?Lymphatics:  No adenopathy ?Back:  No CVA tenderness ?Lungs:  Clear with no wheezes ?HEART:  Regular rate rhythm, no murmurs, no rubs, no clicks ?Abd:  soft, positive bowel sounds, no organomegally, no rebound, no guarding ?Ext:  2 plus pulses, no edema, no cyanosis, no clubbing ?Skin:  No rashes no nodules ?Neuro:  CN II through XII intact, motor grossly intact ? ?EKG - NSR ? ? ?Assess/Plan: Assess/Plan:  ?Chronic systolic heart failure - she appears to be on GDMT. She has undergone a 2D echo in March and her EF is 30%. I have reviewed the indications/risks/benefits/goals/expectations of ICD insertion and she will undergo implant in the coming weeks. ?NSVT - stable. No Life Vest therapies. ?LV thrombus - She will continue Apixaban.  ?Obesity - we will work on weight loss after her device is placed. ?  ?Debra Overlie Rhiannan Kievit,MD ?

## 2021-11-20 ENCOUNTER — Telehealth (HOSPITAL_COMMUNITY): Payer: Self-pay

## 2021-11-20 NOTE — Telephone Encounter (Signed)
Received a fax requesting medical records from [facility]. Records were successfully faxed to: [phone] ,which was the number provided.. Medical request form will be scanned into patients chart.  ? ?

## 2021-12-04 ENCOUNTER — Ambulatory Visit: Payer: Medicaid Other | Admitting: Internal Medicine

## 2021-12-17 NOTE — Pre-Procedure Instructions (Signed)
Attempted to call patient regarding procedure instructions for tomorrow.  Left voicemail on the following items: ?Arrival time 0730 ?Nothing to eat or drink after midnight ?No meds AM of procedure ?Responsible person to drive you home and stay with you for 24 hrs ?Wash with special soap night before and morning of procedure ?If on anti-coagulant drug instructions Eliquis- last dose should have been 5/6  ?

## 2021-12-18 ENCOUNTER — Ambulatory Visit (HOSPITAL_COMMUNITY): Payer: Medicaid Other

## 2021-12-18 ENCOUNTER — Ambulatory Visit (HOSPITAL_COMMUNITY)
Admission: RE | Disposition: A | Discharge status: Home / Self Care | Payer: Medicaid Other | Source: Ambulatory Visit | Attending: Internal Medicine

## 2021-12-18 ENCOUNTER — Ambulatory Visit (HOSPITAL_COMMUNITY)
Admission: RE | Admit: 2021-12-18 | Discharge: 2021-12-18 | Disposition: A | Discharge status: Home / Self Care | Payer: Medicaid Other | Source: Ambulatory Visit | Attending: Internal Medicine | Admitting: Internal Medicine

## 2021-12-18 ENCOUNTER — Other Ambulatory Visit: Payer: Self-pay

## 2021-12-18 DIAGNOSIS — Z6832 Body mass index (BMI) 32.0-32.9, adult: Secondary | ICD-10-CM | POA: Diagnosis not present

## 2021-12-18 DIAGNOSIS — Z7901 Long term (current) use of anticoagulants: Secondary | ICD-10-CM | POA: Insufficient documentation

## 2021-12-18 DIAGNOSIS — I5022 Chronic systolic (congestive) heart failure: Secondary | ICD-10-CM | POA: Insufficient documentation

## 2021-12-18 DIAGNOSIS — I428 Other cardiomyopathies: Secondary | ICD-10-CM | POA: Diagnosis not present

## 2021-12-18 DIAGNOSIS — E669 Obesity, unspecified: Secondary | ICD-10-CM | POA: Insufficient documentation

## 2021-12-18 DIAGNOSIS — I255 Ischemic cardiomyopathy: Secondary | ICD-10-CM | POA: Insufficient documentation

## 2021-12-18 DIAGNOSIS — I472 Ventricular tachycardia, unspecified: Secondary | ICD-10-CM | POA: Insufficient documentation

## 2021-12-18 HISTORY — PX: ICD IMPLANT: EP1208

## 2021-12-18 LAB — PREGNANCY, URINE: Preg Test, Ur: NEGATIVE

## 2021-12-18 SURGERY — ICD IMPLANT

## 2021-12-18 MED ORDER — FENTANYL CITRATE (PF) 100 MCG/2ML IJ SOLN
INTRAMUSCULAR | Status: AC
  Filled 2021-12-18: qty 2

## 2021-12-18 MED ORDER — GENTAMICIN SULFATE 40 MG/ML IJ SOLN
INTRAMUSCULAR | Status: AC
  Filled 2021-12-18: qty 2

## 2021-12-18 MED ORDER — SODIUM CHLORIDE 0.9 % IV SOLN
80.0000 mg | INTRAVENOUS | Status: AC
  Administered 2021-12-18: 80 mg
  Filled 2021-12-18: qty 2

## 2021-12-18 MED ORDER — ACETAMINOPHEN 325 MG PO TABS
325.0000 mg | ORAL_TABLET | ORAL | Status: DC | PRN
  Filled 2021-12-18: qty 2

## 2021-12-18 MED ORDER — FENTANYL CITRATE (PF) 100 MCG/2ML IJ SOLN
INTRAMUSCULAR | Status: DC | PRN
  Administered 2021-12-18 (×2): 12.5 ug via INTRAVENOUS
  Administered 2021-12-18: 25 ug via INTRAVENOUS

## 2021-12-18 MED ORDER — CEFAZOLIN SODIUM-DEXTROSE 2-4 GM/100ML-% IV SOLN
2.0000 g | INTRAVENOUS | Status: AC
  Administered 2021-12-18: 2 g via INTRAVENOUS
  Filled 2021-12-18: qty 100

## 2021-12-18 MED ORDER — POVIDONE-IODINE 10 % EX SWAB
2.0000 "application " | Freq: Once | CUTANEOUS | Status: AC
  Administered 2021-12-18: 2 via TOPICAL

## 2021-12-18 MED ORDER — MIDAZOLAM HCL 5 MG/5ML IJ SOLN
INTRAMUSCULAR | Status: AC
Start: 2021-12-18 — End: ?
  Filled 2021-12-18: qty 5

## 2021-12-18 MED ORDER — CEFAZOLIN SODIUM-DEXTROSE 2-4 GM/100ML-% IV SOLN: 2.0000 g | Freq: Once | INTRAVENOUS | Status: DC

## 2021-12-18 MED ORDER — CHLORHEXIDINE GLUCONATE 4 % EX LIQD
4.0000 "application " | Freq: Once | CUTANEOUS | Status: DC
  Filled 2021-12-18: qty 60

## 2021-12-18 MED ORDER — ONDANSETRON HCL 4 MG/2ML IJ SOLN: 4.0000 mg | Freq: Four times a day (QID) | INTRAMUSCULAR | Status: DC | PRN

## 2021-12-18 MED ORDER — LIDOCAINE HCL (PF) 1 % IJ SOLN
INTRAMUSCULAR | Status: AC
  Filled 2021-12-18: qty 60

## 2021-12-18 MED ORDER — LIDOCAINE HCL (PF) 1 % IJ SOLN
INTRAMUSCULAR | Status: DC | PRN
  Administered 2021-12-18: 60 mL

## 2021-12-18 MED ORDER — MIDAZOLAM HCL 5 MG/5ML IJ SOLN
INTRAMUSCULAR | Status: DC | PRN
  Administered 2021-12-18: 1 mg via INTRAVENOUS
  Administered 2021-12-18: 2 mg via INTRAVENOUS
  Administered 2021-12-18: 1 mg via INTRAVENOUS
  Administered 2021-12-18: 2 mg via INTRAVENOUS

## 2021-12-18 MED ORDER — HEPARIN (PORCINE) IN NACL 1000-0.9 UT/500ML-% IV SOLN
INTRAVENOUS | Status: AC
  Filled 2021-12-18: qty 500

## 2021-12-18 MED ORDER — SODIUM CHLORIDE 0.9 % IV SOLN: INTRAVENOUS | Status: DC

## 2021-12-18 SURGICAL SUPPLY — 6 items
CABLE SURGICAL S-101-97-12 (CABLE) ×2 IMPLANT
ICD MOMENTUM D120 (ICD Generator) ×1 IMPLANT
LEAD RELIANCE 0137-59 (Lead) ×1 IMPLANT
PAD DEFIB RADIO PHYSIO CONN (PAD) ×2 IMPLANT
SHEATH 9FR PRELUDE SNAP 13 (SHEATH) ×1 IMPLANT
TRAY PACEMAKER INSERTION (PACKS) ×2 IMPLANT

## 2021-12-18 NOTE — Discharge Instructions (Signed)
? ? ?  Supplemental Discharge Instructions for  ?Pacemaker/Defibrillator Patients ? ?Tomorrow, 12/19/21, send in a device transmission ? ?Activity ?No heavy lifting or vigorous activity with your left/right arm for 6 to 8 weeks.  Do not raise your left/right arm above your head for one week.  Gradually raise your affected arm as drawn below. ? ?        ?    12/23/21                   12/24/21                     12/25/21                   12/26/21 ?__ ? ?NO DRIVING for  1 week   ; you may begin driving on  12/12/52 . ? ?WOUND CARE ?Keep the wound area clean and dry.  Do not get this area wet , no showers for one week; you may shower on  12/26/21   . ?Tomorrow, 12/19/21, remove the arm sling ?Tomorrow, 12/19/21 remove the outer plastic bandage.  Underneath the plastic bandage there are steri strips (paper tapes), DO NOT remove these. ?The tape/steri-strips on your wound will fall off; do not pull them off.  No bandage is needed on the site.  DO  NOT apply any creams, oils, or ointments to the wound area. ?If you notice any drainage or discharge from the wound, any swelling or bruising at the site, or you develop a fever > 101? F after you are discharged home, call the office at once. ? ?Special Instructions ?You are still able to use cellular telephones; use the ear opposite the side where you have your pacemaker/defibrillator.  Avoid carrying your cellular phone near your device. ?When traveling through airports, show security personnel your identification card to avoid being screened in the metal detectors.  Ask the security personnel to use the hand wand. ?Avoid arc welding equipment, MRI testing (magnetic resonance imaging), TENS units (transcutaneous nerve stimulators).  Call the office for questions about other devices. ?Avoid electrical appliances that are in poor condition or are not properly grounded. ?Microwave ovens are safe to be near or to operate. ? ?Additional information for defibrillator patients should your  device go off: ?If your device goes off ONCE and you feel fine afterward, notify the device clinic nurses. ?If your device goes off ONCE and you do not feel well afterward, call 911. ?If your device goes off TWICE, call 911. ?If your device goes off THREE times in one day, call 911. ? ?DO NOT DRIVE YOURSELF OR A FAMILY MEMBER ?WITH A DEFIBRILLATOR TO THE HOSPITAL--CALL 911. ? ?

## 2021-12-18 NOTE — Progress Notes (Addendum)
Pt HR 108-118, paged Renee PA/ EP to inform of HR. Waiting for call back ?1510; pt to have cxr ?

## 2021-12-18 NOTE — Interval H&P Note (Signed)
History and Physical Interval Note: ? ?12/18/2021 ?7:26 AM ? ?Debra Daniel  has presented today for surgery, with the diagnosis of cardiomyopathy.  The various methods of treatment have been discussed with the patient and family. After consideration of risks, benefits and other options for treatment, the patient has consented to  Procedure(s): ?ICD IMPLANT (N/A) as a surgical intervention.  The patient's history has been reviewed, patient examined, no change in status, stable for surgery.  I have reviewed the patient's chart and labs.  Questions were answered to the patient's satisfaction.   ? ? ?Lewayne Bunting ? ? ?

## 2021-12-19 ENCOUNTER — Telehealth: Payer: Self-pay

## 2021-12-19 ENCOUNTER — Encounter (HOSPITAL_COMMUNITY): Payer: Self-pay | Admitting: Internal Medicine

## 2021-12-19 MED FILL — Heparin Sod (Porcine)-NaCl IV Soln 1000 Unit/500ML-0.9%: INTRAVENOUS | Qty: 500 | Status: AC

## 2021-12-19 NOTE — Telephone Encounter (Signed)
Follow-up after same day discharge: ?Implant date: 12/18/21 ?MD: Cristopher Peru, MD ?Device: [facility] Scientific ICD ?Location:   ? ? ?Wound check visit: 5/24 at 11:20 ?90 day MD follow-up: 8/21 at 1:45 ? ?Remote Transmission received:yes ? ?Dressing/sling removed: to be determined ? ?Confirm [place] restart on: 12/23/21  ? ?Unsuccessful telephone encounter to patient to follow up post ICD implant. Hipaa compliant VM message left requesting call back to [phone]. ?

## 2021-12-19 NOTE — Telephone Encounter (Signed)
-----   Message from [place], [place] sent at 12/18/2021  1:09 PM EDT ----- ?Same day d/c ? ?[employer]i ICD ? ?GT ? ?

## 2021-12-20 NOTE — Telephone Encounter (Signed)
Spoke with patient she voiced understanding of no lifting over 10 pounds with left arm, confirmed date of Eliquis restart and wound check visit also reviewed that if site was to start draining, bleeding fever or chill to call office patient voiced understanding also reviewed that patient was not to get steri strips wet until evening of 01/04/22 patient voiced understanding  ?

## 2022-01-02 ENCOUNTER — Ambulatory Visit: Payer: Medicaid Other

## 2022-01-03 ENCOUNTER — Ambulatory Visit: Payer: Medicaid Other

## 2022-01-03 ENCOUNTER — Ambulatory Visit (INDEPENDENT_AMBULATORY_CARE_PROVIDER_SITE_OTHER): Payer: Medicaid Other

## 2022-01-03 DIAGNOSIS — I5022 Chronic systolic (congestive) heart failure: Secondary | ICD-10-CM | POA: Diagnosis not present

## 2022-01-03 LAB — CUP PACEART INCLINIC DEVICE CHECK
Brady Statistic RV Percent Paced: 0 %
Date Time Interrogation Session: 20230525110932
HighPow Impedance: 74 Ohm
Implantable Lead Implant Date: 20230509
Implantable Lead Location: 753860
Implantable Lead Model: 137
Implantable Lead Serial Number: 301159
Implantable Pulse Generator Implant Date: 20230509
Lead Channel Impedance Value: 645 Ohm
Lead Channel Pacing Threshold Amplitude: 0.9 V
Lead Channel Pacing Threshold Pulse Width: 0.4 ms
Lead Channel Sensing Intrinsic Amplitude: 21 mV
Lead Channel Setting Pacing Amplitude: 3.5 V
Lead Channel Setting Pacing Pulse Width: 0.4 ms
Lead Channel Setting Sensing Sensitivity: 0.5 mV
Pulse Gen Serial Number: 216786

## 2022-01-03 NOTE — Progress Notes (Signed)
Wound check appointment s/p ICD implant 12/18/21. Steri-strips removed. Wound without redness or edema. Incision edges approximated, wound well healed. Normal device function. Thresholds, sensing, and impedances consistent with implant measurements. Device programmed at 3.5V for extra safety margin until 3 month visit. Histogram distribution appropriate for patient and level of activity. No ventricular arrhythmias noted. Patient educated about wound care, arm mobility, lifting restrictions, shock plan. Patient enrolled in remote monitoring with next transmission scheduled 03/20/22. 91 day follow up with Dr. Lovena Le 04/01/22.

## 2022-01-03 NOTE — Patient Instructions (Signed)
   After Your ICD (Implantable Cardiac Defibrillator)    Monitor your defibrillator site for redness, swelling, and drainage. Call the device clinic at [phone] if you experience these symptoms or fever/chills.  Your incision was closed with Steri-strips or staples:  You may shower and wash your incision with soap and water. Avoid lotions, ointments, or perfumes over your incision until it is well-healed.  You may use a hot tub or a pool after your wound check appointment if the incision is completely closed.  Do not lift, push or pull greater than 10 pounds with the affected arm until 6 weeks after your procedure. There are no other restrictions in arm movement after your wound check appointment. June 21  Your ICD is MRI compatible.  Your ICD is designed to protect you from life threatening heart rhythms. Because of this, you may receive a shock.   1 shock with no symptoms:  Call the office during business hours. 1 shock with symptoms (chest pain, chest pressure, dizziness, lightheadedness, shortness of breath, overall feeling unwell):  Call 911. If you experience 2 or more shocks in 24 hours:  Call 911. If you receive a shock, you should not drive.  [place] DMV - no driving for 6 months if you receive appropriate therapy from your ICD.   ICD Alerts:  Some alerts are vibratory and others beep. These are NOT emergencies. Please call our office to let us know. If this occurs at night or on weekends, it can wait until the next business day. Send a remote transmission.  If your device is capable of reading fluid status (for heart failure), you will be offered monthly monitoring to review this with you.   Remote monitoring is used to monitor your ICD from home. This monitoring is scheduled every 91 days by our office. It allows [place] to keep an eye on the functioning of your device to ensure it is working properly. You will routinely see your Electrophysiologist annually (more often if  necessary).

## 2022-01-15 ENCOUNTER — Encounter: Payer: Medicaid Other | Admitting: Genetic Counselor

## 2022-02-04 ENCOUNTER — Telehealth (HOSPITAL_COMMUNITY): Payer: Self-pay

## 2022-02-04 ENCOUNTER — Other Ambulatory Visit (HOSPITAL_COMMUNITY): Payer: Self-pay

## 2022-02-04 MED ORDER — METOPROLOL TARTRATE 25 MG PO TABS: 25.0000 mg | ORAL_TABLET | Freq: Every day | ORAL | 3 refills | Status: DC

## 2022-02-04 MED ORDER — DAPAGLIFLOZIN PROPANEDIOL 10 MG PO TABS: 10.0000 mg | ORAL_TABLET | Freq: Every day | ORAL | 3 refills | Status: DC

## 2022-02-04 MED ORDER — APIXABAN 5 MG PO TABS: 5.0000 mg | ORAL_TABLET | Freq: Two times a day (BID) | ORAL | 3 refills | Status: DC

## 2022-02-04 MED ORDER — SPIRONOLACTONE 25 MG PO TABS: 12.5000 mg | ORAL_TABLET | Freq: Every day | ORAL | 3 refills | Status: DC

## 2022-02-04 NOTE — Telephone Encounter (Signed)
Received notification that prior authorization for Farxiga 10 mg is required.   PA submitted on 02/04/2022 on [email or web address] Prior Authorization Request #: [id number] Status is pending

## 2022-02-06 ENCOUNTER — Other Ambulatory Visit (HOSPITAL_COMMUNITY): Payer: Self-pay

## 2022-02-06 NOTE — Telephone Encounter (Signed)
Patient Advocate Encounter  Prior Authorization for Farxiga 10 mg has been approved.    PA# [email or web address] Prior Authorization Request #: [id number] Effective dates: 02/04/2022 through 02/05/2023  Patients co-pay is $4 for a 90 day supply.

## 2022-02-14 ENCOUNTER — Other Ambulatory Visit: Payer: Self-pay | Admitting: Cardiology

## 2022-03-14 ENCOUNTER — Ambulatory Visit (INDEPENDENT_AMBULATORY_CARE_PROVIDER_SITE_OTHER): Payer: Medicaid Other | Admitting: Obstetrics & Gynecology

## 2022-03-14 ENCOUNTER — Encounter: Payer: Self-pay | Admitting: Obstetrics & Gynecology

## 2022-03-14 VITALS — BP 111/72 | HR 49 | Ht 64.5 in | Wt 202.0 lb

## 2022-03-14 DIAGNOSIS — N921 Excessive and frequent menstruation with irregular cycle: Secondary | ICD-10-CM | POA: Diagnosis not present

## 2022-03-14 DIAGNOSIS — Z86711 Personal history of pulmonary embolism: Secondary | ICD-10-CM

## 2022-03-14 MED ORDER — MEGESTROL ACETATE 40 MG PO TABS: 40.0000 mg | ORAL_TABLET | Freq: Every day | ORAL | 11 refills | Status: DC

## 2022-03-14 NOTE — Progress Notes (Signed)
Follow up appointment for response: Cyclical megestrol  Chief Complaint  Patient presents with   Follow-up    Blood pressure 111/72, pulse (!) 49, height 5' 4.5" (1.638 m), weight 202 lb (91.6 kg).  Cycling on megestrol, 24 days on, 6 days off, mimicking a 24 day POP Minimal spotting on this regimen No complaints much improved     MEDS ordered this encounter: Meds ordered this encounter  Medications   megestrol (MEGACE) 40 MG tablet    Sig: Take 1 tablet (40 mg total) by mouth daily. Begin the 16th of each month    Dispense:  24 tablet    Refill:  11    Orders for this encounter: No orders of the defined types were placed in this encounter.   Impression + Management Plan   ICD-10-CM   1. Menorrhagia with irregular cycle(on chronic anti coagulation)  N92.1    megestrol 24 days on, 6 days off with minimal spotting: will continue    2. History of pulmonary embolus (PE) on eliquis  Z86.711       Follow Up: Return in about 1 year (around 03/15/2023) for Follow up, with Dr Despina Hidden.     All questions were answered.  Past Medical History:  Diagnosis Date   Encounter for menstrual regulation 01/24/2015   Hypothyroid    Migraines    Obesity    UTI (urinary tract infection)     Past Surgical History:  Procedure Laterality Date   broken collar bone  1994   ICD IMPLANT N/A 12/18/2021   Procedure: ICD IMPLANT;  Surgeon: Marinus Maw, MD;  Location: [facility] INVASIVE CV LAB;  Service: Cardiovascular;  Laterality: N/A;   RIGHT/LEFT HEART CATH AND CORONARY ANGIOGRAPHY N/A 02/13/2021   Procedure: RIGHT/LEFT HEART CATH AND CORONARY ANGIOGRAPHY;  Surgeon: Laurey Morale, MD;  Location: [facility] INVASIVE CV LAB;  Service: Cardiovascular;  Laterality: N/A;    OB History     Gravida  0   Para      Term      Preterm      AB      Living         SAB      IAB      Ectopic      Multiple      Live Births              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   Sulfa Antibiotics Hives   Ibuprofen Other (See Comments)    Constipation Can tolerate w milk of mag   Sacubitril-Valsartan Other (See Comments)    Lower her blood pressure to much    Social History   Socioeconomic History   Marital status: Single    Spouse name: Not on file   Number of children: Not on file   Years of education: Not on file   Highest education level: Not on file  Occupational History   Not on file  Tobacco Use   Smoking status: Never   Smokeless tobacco: Never  Vaping Use   Vaping Use: Never used  Substance and Sexual Activity   Alcohol use: No   Drug use: No   Sexual activity: Never  Other Topics Concern   Not on file  Social History Narrative   Not on file   Social Determinants of Health   Financial Resource Strain: Medium Risk (02/13/2021)   Overall Financial Resource Strain (  CARDIA)    Difficulty of Paying Living Expenses: Somewhat hard  Food Insecurity: No Food Insecurity (02/13/2021)   Hunger Vital Sign    Worried About Running Out of Food in the Last Year: Never true    Ran Out of Food in the Last Year: Never true  Transportation Needs: No Transportation Needs (02/13/2021)   PRAPARE - Administrator, Civil Service (Medical): No    Lack of Transportation (Non-Medical): No  Physical Activity: Insufficiently Active (07/25/2020)   Exercise Vital Sign    Days of Exercise per Week: 2 days    Minutes of Exercise per Session: 30 min  Stress: No Stress Concern Present (07/25/2020)   [employer] of Occupational Health - Occupational Stress Questionnaire    Feeling of Stress : Not at all  Social Connections: Socially Isolated (07/25/2020)   Social Connection and Isolation Panel [NHANES]    Frequency of Communication with Friends and Family: More than three times a week    Frequency of Social Gatherings with Friends and Family: More than three times a week    Attends Religious  Services: Never    Database administrator or Organizations: No    Attends Engineer, structural: Never    Marital Status: Never married    Family History  Problem Relation Age of Onset   Hypertension Paternal Grandmother    Coronary artery disease Paternal Grandmother    Hypertension Maternal Grandmother    Diabetes Maternal Grandmother    Hyperlipidemia Maternal Grandmother    Thyroid disease Maternal Grandmother    Kidney disease Maternal Grandmother    Diabetes Father    Hypertension Mother    Hyperlipidemia Mother    COPD Paternal Grandfather    Heart disease Maternal Grandfather    Diabetes Maternal Grandfather    Hypertension Brother    Hyperlipidemia Brother

## 2022-03-18 ENCOUNTER — Ambulatory Visit: Payer: Medicaid Other | Admitting: Obstetrics & Gynecology

## 2022-03-20 ENCOUNTER — Ambulatory Visit (INDEPENDENT_AMBULATORY_CARE_PROVIDER_SITE_OTHER): Payer: Medicaid Other

## 2022-03-20 DIAGNOSIS — I4729 Other ventricular tachycardia: Secondary | ICD-10-CM | POA: Diagnosis not present

## 2022-03-20 LAB — CUP PACEART REMOTE DEVICE CHECK
Battery Remaining Longevity: 180 mo
Battery Remaining Percentage: 100 %
Brady Statistic RV Percent Paced: 0 %
Date Time Interrogation Session: 20230809001100
HighPow Impedance: 84 Ohm
Implantable Lead Implant Date: 20230509
Implantable Lead Location: 753860
Implantable Lead Model: 137
Implantable Lead Serial Number: 301159
Implantable Pulse Generator Implant Date: 20230509
Lead Channel Impedance Value: 594 Ohm
Lead Channel Pacing Threshold Amplitude: 0.8 V
Lead Channel Pacing Threshold Pulse Width: 0.4 ms
Lead Channel Setting Pacing Amplitude: 3.5 V
Lead Channel Setting Pacing Pulse Width: 0.4 ms
Lead Channel Setting Sensing Sensitivity: 0.5 mV
Pulse Gen Serial Number: 216786

## 2022-04-01 ENCOUNTER — Encounter: Payer: Self-pay | Admitting: Internal Medicine

## 2022-04-01 ENCOUNTER — Ambulatory Visit (INDEPENDENT_AMBULATORY_CARE_PROVIDER_SITE_OTHER): Payer: Medicaid Other | Admitting: Internal Medicine

## 2022-04-01 VITALS — BP 98/64 | HR 85 | Ht 64.5 in | Wt 203.3 lb

## 2022-04-01 DIAGNOSIS — I4729 Other ventricular tachycardia: Secondary | ICD-10-CM | POA: Insufficient documentation

## 2022-04-01 DIAGNOSIS — I5022 Chronic systolic (congestive) heart failure: Secondary | ICD-10-CM | POA: Diagnosis not present

## 2022-04-01 DIAGNOSIS — Z95810 Presence of automatic (implantable) cardiac defibrillator: Secondary | ICD-10-CM

## 2022-04-01 NOTE — Patient Instructions (Addendum)
Medication Instructions:  Your physician recommends that you continue on your current medications as directed. Please refer to the Current Medication list given to you today.  *If you need a refill on your cardiac medications before your next appointment, please call your pharmacy*  NO CHANGES TO YOUR MEDICATIONS.  Lab Work: None ordered.  If you have labs (blood work) drawn today and your tests are completely normal, you will receive your results only by: MyChart Message (if you have MyChart) OR A paper copy in the mail If you have any lab test that is abnormal or we need to change your treatment, we will call you to review the results.  Testing/Procedures: None ordered.  Follow-Up:   IN 1 YEAR WITH DR. GREGG TAYLOR, [facility] ON [address].   Remote monitoring is used to monitor your ICD from home. This monitoring reduces the number of office visits required to check your device to one time per year. It allows [place] to keep an eye on the functioning of your device to ensure it is working properly. You are scheduled for a device check from home on 06/19/22. You may send your transmission at any time that day. If you have a wireless device, the transmission will be sent automatically. After your physician reviews your transmission, you will receive a postcard with your next transmission date.  Important Information About Sugar

## 2022-04-01 NOTE — Progress Notes (Signed)
HPI  Debra Daniel returns today for followup. She is a pleasant 36 yo woman with a h/o non-ischemic CM and chronic systolic heart failure diagnosed in June. She has been treated with GDMT and has undergone repeat 2D echo to evaluation of her LV function in March which demonstrated persistently decreased LV function with an EF of 30%. She underwent ICD insertion about 3 months ago. In the interim, she notes that she feels well with no chest pain or sob or syncope. No ICD therapies.   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     Blood Glucose Monitoring Suppl (ACCU-CHEK GUIDE) w/Device KIT Use as directed 1 kit 0   dapagliflozin propanediol (FARXIGA) 10 MG TABS tablet Take 1 tablet (10 mg total) by mouth daily. 90 tablet 3   digoxin (LANOXIN) 0.125 MG tablet Take 0.5 tablets (0.0625 mg total) by mouth daily. 45 tablet 2   ELIQUIS 5 MG TABS tablet TAKE 1 TABLET BY MOUTH TWICE DAILY 180 tablet 0   glucose blood (ACCU-CHEK GUIDE) test strip Use as instructed up to 4 times daily 100 each 12   levothyroxine (SYNTHROID) 75 MCG tablet Take 75 mcg by mouth daily.     losartan (COZAAR) 50 MG tablet Take 1 tablet (50 mg total) by mouth at bedtime. 30 tablet 11   magnesium oxide (MAG-OX) 400 MG tablet Take 1 tablet (400 mg total) by mouth daily. 30 tablet 11   megestrol (MEGACE) 40 MG tablet Take 1 tablet (40 mg total) by mouth daily. Begin the 16th of each month 24 tablet 11   metoprolol tartrate  (LOPRESSOR) 25 MG tablet Take 1 tablet (25 mg total) by mouth at bedtime. 90 tablet 3   potassium chloride SA (KLOR-CON M) 20 MEQ tablet Take 1 tablet (20 mEq total) by mouth daily. 90 tablet 2   promethazine (PHENERGAN) 25 MG tablet Take 1 tablet (25 mg total) by mouth every 6 (six) hours as needed for nausea or vomiting. 30 tablet 0   spironolactone (ALDACTONE) 25 MG tablet Take 0.5 tablets (12.5 mg total) by mouth daily. 45 tablet 3   torsemide (DEMADEX) 20 MG tablet Take 1 tablet (20 mg total) by mouth daily. 90 tablet 2   No current facility-administered medications for this visit.     Past Medical History:  Diagnosis Date   Encounter for menstrual regulation 01/24/2015   Hypothyroid    Migraines    Obesity    UTI (urinary tract infection)     ROS:   All systems reviewed and negative except as noted in the HPI.   Past Surgical History:  Procedure Laterality Date   broken collar bone  1994   ICD IMPLANT N/A 12/18/2021   Procedure: ICD IMPLANT;  Surgeon: Evans Lance, MD;  Location: [place] CV LAB;  Service: Cardiovascular;  Laterality: N/A;   RIGHT/LEFT HEART CATH AND CORONARY ANGIOGRAPHY N/A 02/13/2021  Procedure: RIGHT/LEFT HEART CATH AND CORONARY ANGIOGRAPHY;  Surgeon: Larey Dresser, MD;  Location: [place] CV LAB;  Service: Cardiovascular;  Laterality: N/A;     Family History  Problem Relation Age of Onset   Hypertension Paternal Grandmother    Coronary artery disease Paternal Grandmother    Hypertension Maternal Grandmother    Diabetes Maternal Grandmother    Hyperlipidemia Maternal Grandmother    Thyroid disease Maternal Grandmother    Kidney disease Maternal Grandmother    Diabetes Father    Hypertension Mother    Hyperlipidemia Mother    COPD Paternal Grandfather    Heart disease Maternal Grandfather    Diabetes Maternal Grandfather    Hypertension Brother    Hyperlipidemia Brother      Social History   Socioeconomic History   Marital  status: Single    Spouse name: Not on file   Number of children: Not on file   Years of education: Not on file   Highest education level: Not on file  Occupational History   Not on file  Tobacco Use   Smoking status: Never   Smokeless tobacco: Never  Vaping Use   Vaping Use: Never used  Substance and Sexual Activity   Alcohol use: No   Drug use: No   Sexual activity: Never  Other Topics Concern   Not on file  Social History Narrative   Not on file   Social Determinants of Health   Financial Resource Strain: Medium Risk (02/13/2021)   Overall Financial Resource Strain (CARDIA)    Difficulty of Paying Living Expenses: Somewhat hard  Food Insecurity: No Food Insecurity (02/13/2021)   Hunger Vital Sign    Worried About Running Out of Food in the Last Year: Never true    Ran Out of Food in the Last Year: Never true  Transportation Needs: No Transportation Needs (02/13/2021)   PRAPARE - Hydrologist (Medical): No    Lack of Transportation (Non-Medical): No  Physical Activity: Insufficiently Active (07/25/2020)   Exercise Vital Sign    Days of Exercise per Week: 2 days    Minutes of Exercise per Session: 30 min  Stress: No Stress Concern Present (07/25/2020)   [place]    Feeling of Stress : Not at all  Social Connections: Socially Isolated (07/25/2020)   Social Connection and Isolation Panel [NHANES]    Frequency of Communication with Friends and Family: More than three times a week    Frequency of Social Gatherings with Friends and Family: More than three times a week    Attends Religious Services: Never    Marine scientist or Organizations: No    Attends Archivist Meetings: Never    Marital Status: Never married  Intimate Partner Violence: Not At Risk (07/25/2020)   Humiliation, Afraid, Rape, and Kick questionnaire    Fear of Current or Ex-Partner: No     Emotionally Abused: No    Physically Abused: No    Sexually Abused: No     BP 98/64   Pulse 85   Ht 5' 4.5" (1.638 m)   Wt 203 lb 4.8 oz (92.2 kg)   LMP  (LMP Unknown)   SpO2 99%   BMI 34.36 kg/m   Physical Exam:  Well appearing NAD HEENT: Unremarkable Neck:  No JVD, no thyromegally Lymphatics:  No adenopathy Back:  No CVA tenderness Lungs:  Clear HEART:  Regular rate rhythm, no  murmurs, no rubs, no clicks Abd:  soft, positive bowel sounds, no organomegally, no rebound, no guarding Ext:  2 plus pulses, no edema, no cyanosis, no clubbing Skin:  No rashes no nodules Neuro:  CN II through XII intact, motor grossly intact  EKG  DEVICE  Normal device function.  See PaceArt for details.   Assess/Plan:   Chronic systolic heart failure - she appears to be on GDMT. She has undergone a 2D echo in March and her EF is 30%. She will continue her current meds. NSVT - she has not had any since her ICD was placed. LV thrombus - She will continue Apixaban.  Obesity - we will work on weight loss. I asked her to lose 5 lbs.    Debra Overlie Deondray Ospina,MD

## 2022-04-03 NOTE — Addendum Note (Signed)
Addended by: Mariam Dollar on: 04/03/2022 02:08 PM   Modules accepted: Orders

## 2022-04-12 ENCOUNTER — Telehealth (HOSPITAL_COMMUNITY): Payer: Self-pay | Admitting: *Deleted

## 2022-04-12 MED ORDER — POTASSIUM CHLORIDE CRYS ER 20 MEQ PO TBCR: 20.0000 meq | EXTENDED_RELEASE_TABLET | Freq: Every day | ORAL | 2 refills | Status: DC

## 2022-04-12 MED ORDER — TORSEMIDE 20 MG PO TABS: 20.0000 mg | ORAL_TABLET | Freq: Every day | ORAL | 2 refills | Status: DC

## 2022-04-12 MED ORDER — DIGOXIN 125 MCG PO TABS: 0.0625 mg | ORAL_TABLET | Freq: Every day | ORAL | 2 refills | Status: DC

## 2022-04-12 MED ORDER — MAGNESIUM OXIDE 400 MG PO TABS: 400.0000 mg | ORAL_TABLET | Freq: Every day | ORAL | 3 refills | Status: DC

## 2022-04-12 NOTE — Telephone Encounter (Signed)
Refill encounter ?

## 2022-04-23 NOTE — Progress Notes (Signed)
Remote ICD transmission.   

## 2022-05-03 ENCOUNTER — Encounter (HOSPITAL_COMMUNITY): Payer: Self-pay | Admitting: Internal Medicine

## 2022-05-03 ENCOUNTER — Ambulatory Visit (HOSPITAL_COMMUNITY)
Admission: RE | Admit: 2022-05-03 | Discharge: 2022-05-03 | Disposition: A | Discharge status: Home / Self Care | Payer: Medicaid Other | Source: Ambulatory Visit | Attending: Internal Medicine | Admitting: Internal Medicine

## 2022-05-03 VITALS — BP 110/70 | HR 83 | Wt 207.4 lb

## 2022-05-03 DIAGNOSIS — Z4502 Encounter for adjustment and management of automatic implantable cardiac defibrillator: Secondary | ICD-10-CM | POA: Diagnosis not present

## 2022-05-03 DIAGNOSIS — I5022 Chronic systolic (congestive) heart failure: Secondary | ICD-10-CM

## 2022-05-03 DIAGNOSIS — I081 Rheumatic disorders of both mitral and tricuspid valves: Secondary | ICD-10-CM | POA: Insufficient documentation

## 2022-05-03 DIAGNOSIS — I472 Ventricular tachycardia, unspecified: Secondary | ICD-10-CM | POA: Insufficient documentation

## 2022-05-03 DIAGNOSIS — Z7901 Long term (current) use of anticoagulants: Secondary | ICD-10-CM | POA: Insufficient documentation

## 2022-05-03 DIAGNOSIS — Z79899 Other long term (current) drug therapy: Secondary | ICD-10-CM | POA: Insufficient documentation

## 2022-05-03 DIAGNOSIS — I272 Pulmonary hypertension, unspecified: Secondary | ICD-10-CM | POA: Insufficient documentation

## 2022-05-03 DIAGNOSIS — Z7984 Long term (current) use of oral hypoglycemic drugs: Secondary | ICD-10-CM | POA: Diagnosis not present

## 2022-05-03 LAB — BASIC METABOLIC PANEL
Anion gap: 9 (ref 5–15)
BUN: 14 mg/dL (ref 6–20)
CO2: 23 mmol/L (ref 22–32)
Calcium: 9.1 mg/dL (ref 8.9–10.3)
Chloride: 105 mmol/L (ref 98–111)
Creatinine, Ser: 0.92 mg/dL (ref 0.44–1.00)
GFR, Estimated: >60 mL/min (ref >60)
Glucose, Bld: 106 mg/dL — ABNORMAL HIGH (ref 70–99)
Potassium: 3.3 mmol/L — ABNORMAL LOW (ref 3.5–5.1)
Sodium: 137 mmol/L (ref 135–145)

## 2022-05-03 LAB — BRAIN NATRIURETIC PEPTIDE: B Natriuretic Peptide: 34.6 pg/mL (ref 0.0–100.0)

## 2022-05-03 MED ORDER — METOPROLOL SUCCINATE ER 50 MG PO TB24: 50.0000 mg | ORAL_TABLET | Freq: Every day | ORAL | 3 refills | Status: DC

## 2022-05-03 MED ORDER — LOSARTAN POTASSIUM 50 MG PO TABS: 50.0000 mg | ORAL_TABLET | Freq: Every day | ORAL | 3 refills | Status: DC

## 2022-05-03 NOTE — Progress Notes (Incomplete)
Advanced Heart Failure Clinic Note    PCP: [facility] Dept. PCP-Cardiologist: None  HF Cardiologist: Dr. Haroldine Laws  HPI: Debra Daniel is a 36 y.o. with systolic heart failure due to NICM, DM2 hypothyroidism, LV thrombus & PE on Eliquis.    Presented to the ED in [facility] 02/05/21 w/ hemoptysis.  CTA chest/abdomen/pelvis with acute RLL segmental and subsegmental PE, consolidation in bilateral lower lobes, diffuse anasarca. OCPs were stopped. Echo showed EF of 25-30%, moderate to severe MR, moderate TR. She was transferred to [facility] for additional pulmonary and cardiac evaluation.   Echo at [facility] LVEF of 10-15% with mod dilated LV, severe secondary MR, moderate to severe RV dysfunction with moderately enlarged RV, severe TR. She developed cardiogenic shock and was placed on milrinone, and briefly on low-dose NE. L/RHC (02/13/21): no CAD. She developed PMVT requiring shock x 2 and brief CPR. QTc long, amio stopped. Sustained another episode of PMVT, ivabradine stopped.    ICD placed by [facility] 12/18/21. No problems has healed well   Today she returns for HF follow up with her mother. Still walking regularly 1-2 miles per day. No SOB, CP, edema. Compliant with medications. Has decided against genetic testing  ICD interrogation: HL score 0 No VT/AF. Fluid ok Personally reviewed   Echo 10/22/21: EF 20% RV moderately HK. G1DD. Trivial MR Personally reviewed   Echo 07/16/21 EF 20-25% RV normal. Mild to mod MR. Mild TR Personally reviewed   Cardiac Studies: - Echo (6/22): EF 10-15%, RV moderately enlarged with severely reduced systolic function, RVSP 17.7 mmHg, severe secondary MR, mod-severe TR.  - R/LHC (7/22): No CAD, with CI 2.48 on milrinone 0.25. Mildly elevated filling pressures, pulmonary venous hypertension. RA mean 9 RV 52/17 PA 53/17, mean 31 PCWP mean 16 LV 90/14 AO 90/63  Oxygen saturations: PA 57% AO 98%  Cardiac Output (Fick) 5.1  Cardiac Index (Fick) 2.48  PVR 2.9  WU - cMRI (7/22): LVEF 18%, diffuse hypokinesis, LV thrombus, mod dilated RV with EF 15%, mild MR, subtle non-coronary mid-wall LGE pattern in the inferior and anterolateral walls, possible prior myocarditis.  Past Medical History:  Diagnosis Date   Encounter for menstrual regulation 01/24/2015   Hypothyroid    Migraines    Obesity    UTI (urinary tract infection)    Current Outpatient Medications  Medication Sig Dispense Refill   Accu-Chek Softclix Lancets lancets Use as directed up to 4 times daily 100 each 5   acetaminophen (TYLENOL) 500 MG tablet Take 1,000 mg by mouth every 6 (six) hours as needed for mild pain.     Blood Glucose Monitoring Suppl (ACCU-CHEK GUIDE) w/Device KIT Use as directed 1 kit 0   dapagliflozin propanediol (FARXIGA) 10 MG TABS tablet Take 1 tablet (10 mg total) by mouth daily. 90 tablet 3   digoxin (LANOXIN) 0.125 MG tablet Take 0.5 tablets (0.0625 mg total) by mouth daily. 45 tablet 2   ELIQUIS 5 MG TABS tablet TAKE 1 TABLET BY MOUTH TWICE DAILY 180 tablet 0   glucose blood (ACCU-CHEK GUIDE) test strip Use as instructed up to 4 times daily 100 each 12   levothyroxine (SYNTHROID) 75 MCG tablet Take 75 mcg by mouth daily.     losartan (COZAAR) 50 MG tablet Take 1 tablet (50 mg total) by mouth at bedtime. 30 tablet 11   magnesium oxide (MAG-OX) 400 MG tablet Take 1 tablet (400 mg total) by mouth daily. 90 tablet 3   megestrol (MEGACE) 40 MG tablet Take 1 tablet (  40 mg total) by mouth daily. Begin the 16th of each month 24 tablet 11   metoprolol tartrate (LOPRESSOR) 25 MG tablet Take 1 tablet (25 mg total) by mouth at bedtime. 90 tablet 3   potassium chloride SA (KLOR-CON M) 20 MEQ tablet Take 1 tablet (20 mEq total) by mouth daily. 90 tablet 2   promethazine (PHENERGAN) 25 MG tablet Take 1 tablet (25 mg total) by mouth every 6 (six) hours as needed for nausea or vomiting. 30 tablet 0   spironolactone (ALDACTONE) 25 MG tablet Take 0.5 tablets (12.5 mg total) by  mouth daily. 45 tablet 3   torsemide (DEMADEX) 20 MG tablet Take 1 tablet (20 mg total) by mouth daily. 90 tablet 2   No current facility-administered medications for this encounter.   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   Sulfa Antibiotics Hives   Ibuprofen Other (See Comments)    Constipation Can tolerate w milk of mag   Sacubitril-Valsartan Other (See Comments)    Lower her blood pressure to much    Social History   Socioeconomic History   Marital status: Single    Spouse name: Not on file   Number of children: Not on file   Years of education: Not on file   Highest education level: Not on file  Occupational History   Not on file  Tobacco Use   Smoking status: Never   Smokeless tobacco: Never  Vaping Use   Vaping Use: Never used  Substance and Sexual Activity   Alcohol use: No   Drug use: No   Sexual activity: Never  Other Topics Concern   Not on file  Social History Narrative   Not on file   Social Determinants of Health   Financial Resource Strain: Medium Risk (02/13/2021)   Overall Financial Resource Strain (CARDIA)    Difficulty of Paying Living Expenses: Somewhat hard  Food Insecurity: No Food Insecurity (02/13/2021)   Hunger Vital Sign    Worried About Running Out of Food in the Last Year: Never true    Ran Out of Food in the Last Year: Never true  Transportation Needs: No Transportation Needs (02/13/2021)   PRAPARE - Hydrologist (Medical): No    Lack of Transportation (Non-Medical): No  Physical Activity: Insufficiently Active (07/25/2020)   Exercise Vital Sign    Days of Exercise per Week: 2 days    Minutes of Exercise per Session: 30 min  Stress: No Stress Concern Present (07/25/2020)   [place]    Feeling of Stress : Not at all  Social Connections: Socially Isolated (07/25/2020)   Social  Connection and Isolation Panel [NHANES]    Frequency of Communication with Friends and Family: More than three times a week    Frequency of Social Gatherings with Friends and Family: More than three times a week    Attends Religious Services: Never    Marine scientist or Organizations: No    Attends Archivist Meetings: Never    Marital Status: Never married  Intimate Partner Violence: Not At Risk (07/25/2020)   Humiliation, Afraid, Rape, and Kick questionnaire    Fear of Current or Ex-Partner: No    Emotionally Abused: No    Physically Abused: No    Sexually Abused: No   Family History  Problem Relation Age of Onset   Hypertension Paternal  Grandmother    Coronary artery disease Paternal Grandmother    Hypertension Maternal Grandmother    Diabetes Maternal Grandmother    Hyperlipidemia Maternal Grandmother    Thyroid disease Maternal Grandmother    Kidney disease Maternal Grandmother    Diabetes Father    Hypertension Mother    Hyperlipidemia Mother    COPD Paternal Grandfather    Heart disease Maternal Grandfather    Diabetes Maternal Grandfather    Hypertension Brother    Hyperlipidemia Brother    BP 110/70   Pulse 83   Wt 94.1 kg (207 lb 6.4 oz)   SpO2 97%   BMI 35.05 kg/m   Wt Readings from Last 3 Encounters:  05/03/22 94.1 kg (207 lb 6.4 oz)  04/01/22 92.2 kg (203 lb 4.8 oz)  03/14/22 91.6 kg (202 lb)   PHYSICAL EXAM: General:  Well appearing. No resp difficulty HEENT: normal Neck: supple. no JVD. Carotids 2+ bilat; no bruits. No lymphadenopathy or thryomegaly appreciated. Cor: PMI nondisplaced. Regular rate & rhythm. No rubs, gallops or murmurs. Lungs: clear Abdomen: obese soft, nontender, nondistended. No hepatosplenomegaly. No bruits or masses. Good bowel sounds. Extremities: no cyanosis, clubbing, rash, edema Neuro: alert & orientedx3, cranial nerves grossly intact. moves all 4 extremities w/o difficulty. Affect pleasant   ASSESSMENT &  PLAN: 1. Chronic systolic HF  - Onset 0/63 ? viral myocarditis vs familial cardiomyopathy (grandmother and aunts/uncles with CHF).   - Echo (6/22): EF of 10-15%, moderate to severely reduced RV fxn with mildly dilated RV, RVSP 45 mmHg, severe secondary MR, mod-severe TR. - cMRI (7/22): LV EF 18%, RV EF 26%, LV thrombus, Subtle non-coronary mid-wall LGE pattern in the inferior and anterolateral walls, possible prior myocarditis. - R/LHC (7/22): No CAD, with CI 2.48 on milrinone 0.25.   - Echo 07/16/21 EF 20-25% LV smaller Mild to mod MR. Mild TR - Echo 10/22/21 EF 20% Mildly dilated RV mod HK. G1DD. Triv MR Personally reviewed - Stable NYHA II. Volume ok  - Increase Toprol 25 -> 50 mg qhs - Continue torsemide 20 mg daily. - Continue losartan 50 mg daily (BP did not tolerate Entresto). - Continue spironolactone 12.5 mg daily. - Continue dapagliflozin 10 daily. - Continue digoxin 0.0625 mg daily. Can likely stop at next visit - Off ivabradine with PMVT. - Has decided against genetic testing.  - ICD interrogated personally. HL-Score 0. No VT/AF   - Labs today  2. Mitral valve regurgitation, functional - Severe on echo this past admission, only looked mild on cMRI however. - MR trivial on echo 3/23   3. Moderate-severe TR - Trivial on echo today    4. NSVT  - She is off amiodarone and ivabradine w/ prolonged QTc and subsequent PMVT. - Now s/p [employer] ICD  5. LV thrombus - Remains on apixaban    Glori Bickers, MD 05/03/22  10:54 AM

## 2022-05-03 NOTE — Patient Instructions (Signed)
STOP Metoprol Tartrate   START Toprol XL 50 mg daily  Labs done today, your results will be available in MyChart, we will contact you for abnormal readings.  Your physician has requested that you have an echocardiogram. Echocardiography is a painless test that uses sound waves to create images of your heart. It provides your doctor with information about the size and shape of your heart and how well your heart's chambers and valves are working. This procedure takes approximately one hour. There are no restrictions for this procedure.  Your physician recommends that you schedule a follow-up appointment in: 6 months  with an echocardiogram ( March 2024)  ** please call the office in January to arrange your follow up appointment **  If you have any questions or concerns before your next appointment please send [place] a message through [place] or call our office at [phone].    TO LEAVE A MESSAGE FOR THE NURSE SELECT OPTION 2, PLEASE LEAVE A MESSAGE INCLUDING: YOUR NAME DATE OF BIRTH CALL BACK NUMBER REASON FOR CALL**this is important as we prioritize the call backs  YOU WILL RECEIVE A CALL BACK THE SAME DAY AS LONG AS YOU CALL BEFORE 4:00 PM  At the [facility], you and your health needs are our priority. As part of our continuing mission to provide you with exceptional heart care, we have created designated Provider Care Teams. These Care Teams include your primary Cardiologist (physician) and Advanced Practice Providers (APPs- Physician Assistants and Nurse Practitioners) who all work together to provide you with the care you need, when you need it.   You may see any of the following providers on your designated Care Team at your next follow up: Dr Glori Bickers Dr Loralie Champagne Dr. Roxana Hires, NP Lyda Jester, [place] [facility] [place], [place] Forestine Na, NP Audry Riles, PharmD   Please be sure to bring in all your medications bottles  to every appointment.

## 2022-05-04 ENCOUNTER — Encounter: Payer: Self-pay | Admitting: Obstetrics & Gynecology

## 2022-05-04 NOTE — Addendum Note (Signed)
Encounter addended by: Jolaine Artist, MD on: 05/04/2022 4:34 PM  Actions taken: Charge Capture section accepted

## 2022-05-13 ENCOUNTER — Telehealth (HOSPITAL_COMMUNITY): Payer: Self-pay

## 2022-05-13 MED ORDER — POTASSIUM CHLORIDE CRYS ER 20 MEQ PO TBCR: 40.0000 meq | EXTENDED_RELEASE_TABLET | Freq: Every day | ORAL | 3 refills | Status: DC

## 2022-05-13 NOTE — Telephone Encounter (Signed)
-----   Message from Jolaine Artist, MD sent at 05/08/2022  7:15 PM EDT ----- Increase kdur to 40 daily (she is on 20 correct?) Recheck 2 weeks

## 2022-05-13 NOTE — Telephone Encounter (Signed)
Patient advised and verbalized understanding. Patient would like for labs to be drawn at pcp office. Med list updated to reflect changes.   Meds ordered this encounter  Medications   potassium chloride SA (KLOR-CON M) 20 MEQ tablet    Sig: Take 2 tablets (40 mEq total) by mouth daily.    Dispense:  180 tablet    Refill:  3    Please cancel all previous orders for current medication. Change in dosage or pill size.

## 2022-05-14 MED ORDER — MEGESTROL ACETATE 40 MG PO TABS: ORAL_TABLET | ORAL | 11 refills | Status: DC

## 2022-05-14 NOTE — Addendum Note (Signed)
Addended by: Florian Buff on: 05/14/2022 05:04 PM   Modules accepted: Orders

## 2022-06-19 ENCOUNTER — Ambulatory Visit (INDEPENDENT_AMBULATORY_CARE_PROVIDER_SITE_OTHER): Payer: Medicaid Other

## 2022-06-19 DIAGNOSIS — R570 Cardiogenic shock: Secondary | ICD-10-CM | POA: Diagnosis not present

## 2022-06-19 LAB — CUP PACEART REMOTE DEVICE CHECK
Battery Remaining Longevity: 180 mo
Battery Remaining Percentage: 100 %
Brady Statistic RV Percent Paced: 0 %
Date Time Interrogation Session: 20231108001200
HighPow Impedance: 76 Ohm
Implantable Lead Connection Status: 753985
Implantable Lead Implant Date: 20230509
Implantable Lead Location: 753860
Implantable Lead Model: 137
Implantable Lead Serial Number: 301159
Implantable Pulse Generator Implant Date: 20230509
Lead Channel Impedance Value: 572 Ohm
Lead Channel Pacing Threshold Amplitude: 0.9 V
Lead Channel Pacing Threshold Pulse Width: 0.4 ms
Lead Channel Setting Pacing Amplitude: 2.5 V
Lead Channel Setting Pacing Pulse Width: 0.4 ms
Lead Channel Setting Sensing Sensitivity: 0.5 mV
Pulse Gen Serial Number: 216786
Zone Setting Status: 755011

## 2022-07-02 NOTE — Progress Notes (Signed)
Remote ICD transmission.   

## 2022-07-15 IMAGING — DX DG CHEST 2V
2 series · 2 of 2 positions shown · non-contrast
Comparison: Chest two views 02/20/2021, CT chest 02/06/2021

CLINICAL DATA: Status post ICD placement.

EXAM:
CHEST - 2 VIEW

[x chest ap]
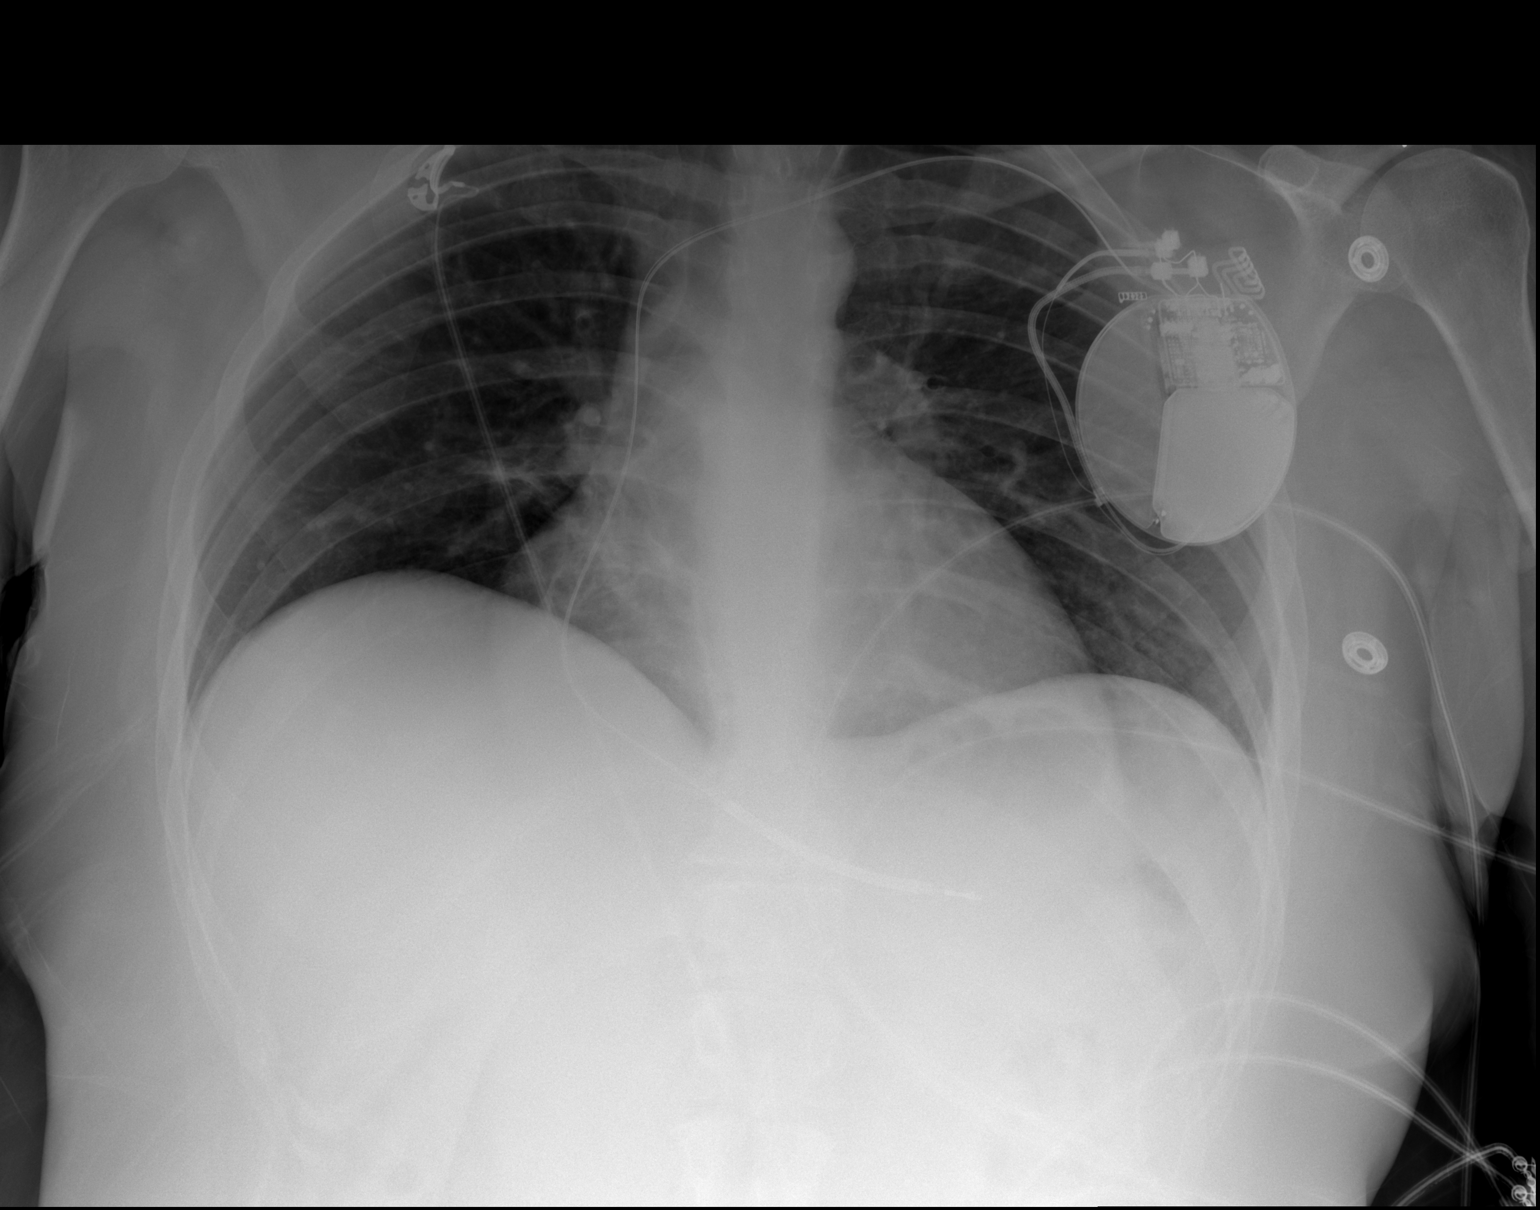

[w chest lat]
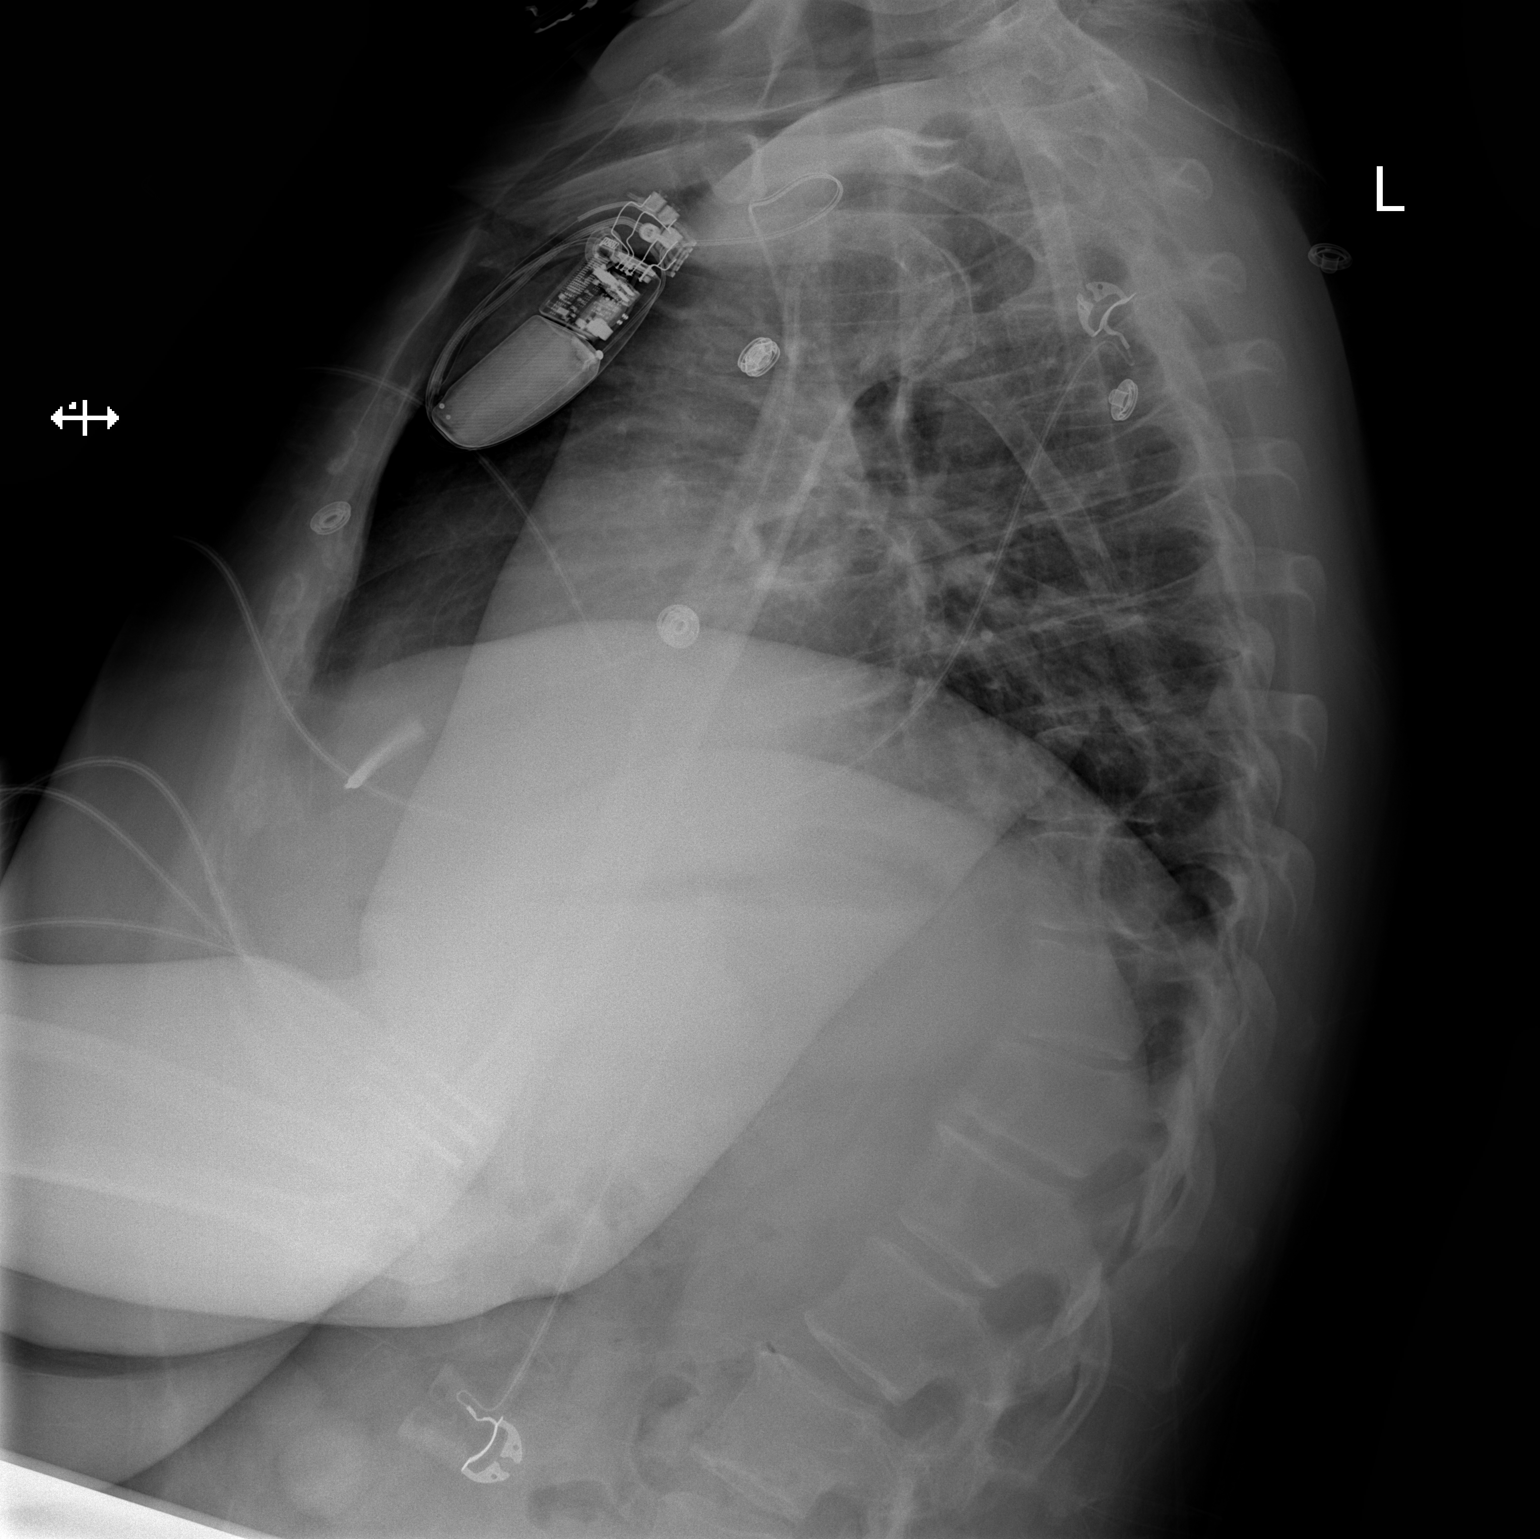

[2 of 2 positions shown; findings below may reference images not displayed]

FINDINGS: Interval removal of right upper extremity PICC.

Left chest wall cardiac AICD with single lead overlying the right
ventricle, new from prior. Cardiac silhouette is again mildly
enlarged. Moderately decreased lung volumes. Improved bibasilar lung
aeration with apparent resolution of the prior posteroinferior
right-greater-than-left lung airspace consolidation. No pleural
effusion or pneumothorax. Mild multilevel degenerative disc changes
of the thoracic spine.
IMPRESSION: :
IMPRESSION: 1. New left AICD with single lead overlying the right ventricle.
2. No evidence of pneumothorax.
3. Improved bibasilar lung aeration with likely resolution of the
prior pneumonia.

## 2022-08-29 ENCOUNTER — Other Ambulatory Visit (HOSPITAL_COMMUNITY): Payer: Self-pay | Admitting: Cardiology

## 2022-08-29 DIAGNOSIS — I5022 Chronic systolic (congestive) heart failure: Secondary | ICD-10-CM

## 2022-09-18 ENCOUNTER — Ambulatory Visit (INDEPENDENT_AMBULATORY_CARE_PROVIDER_SITE_OTHER): Payer: Medicaid Other

## 2022-09-18 DIAGNOSIS — R570 Cardiogenic shock: Secondary | ICD-10-CM | POA: Diagnosis not present

## 2022-09-18 LAB — CUP PACEART REMOTE DEVICE CHECK
Battery Remaining Longevity: 174 mo
Battery Remaining Percentage: 100 %
Brady Statistic RV Percent Paced: 0 %
Date Time Interrogation Session: 20240207001100
HighPow Impedance: 79 Ohm
Implantable Lead Connection Status: 753985
Implantable Lead Implant Date: 20230509
Implantable Lead Location: 753860
Implantable Lead Model: 137
Implantable Lead Serial Number: 301159
Implantable Pulse Generator Implant Date: 20230509
Lead Channel Impedance Value: 595 Ohm
Lead Channel Pacing Threshold Amplitude: 1 V
Lead Channel Pacing Threshold Pulse Width: 0.4 ms
Lead Channel Setting Pacing Amplitude: 2.5 V
Lead Channel Setting Pacing Pulse Width: 0.4 ms
Lead Channel Setting Sensing Sensitivity: 0.5 mV
Pulse Gen Serial Number: 216786
Zone Setting Status: 755011

## 2022-10-14 NOTE — Progress Notes (Signed)
Remote ICD transmission.   

## 2022-10-22 ENCOUNTER — Encounter: Payer: Self-pay | Admitting: Obstetrics & Gynecology

## 2022-10-23 MED ORDER — NORETHINDRONE 0.35 MG PO TABS: 1.0000 | ORAL_TABLET | Freq: Every day | ORAL | 11 refills | Status: DC

## 2022-10-23 NOTE — Addendum Note (Signed)
Addended by: Florian Buff on: 10/23/2022 04:20 PM   Modules accepted: Orders

## 2022-10-29 ENCOUNTER — Other Ambulatory Visit (HOSPITAL_COMMUNITY): Payer: Self-pay

## 2022-10-30 ENCOUNTER — Other Ambulatory Visit (HOSPITAL_COMMUNITY): Payer: Self-pay

## 2022-10-30 ENCOUNTER — Telehealth (HOSPITAL_COMMUNITY): Payer: Self-pay

## 2022-10-30 NOTE — Telephone Encounter (Signed)
Patient Advocate Encounter  Prior authorization is required for [place]. PA submitted and APPROVED on 10/30/22. Test billing returns $4 copay for 90 days.  Key [id number] Effective: 10/30/22 - 06/26/4  Clista Bernhardt, CPhT Rx Patient Advocate Phone: [phone]

## 2022-11-01 NOTE — Progress Notes (Signed)
Advanced Heart Failure Clinic Note    PCP: [facility] Dept. HF Cardiologist: Dr. Haroldine Laws  HPI: Debra Daniel is a 37 y.o. with systolic heart failure due to NICM, DM2 hypothyroidism, LV thrombus & PE on Eliquis.    Presented to the ED in [facility] 02/05/21 w/ hemoptysis.  CTA chest/abdomen/pelvis with acute RLL segmental and subsegmental PE, consolidation in bilateral lower lobes, diffuse anasarca. OCPs were stopped. Echo showed EF of 25-30%, moderate to severe MR, moderate TR. She was transferred to [facility] for additional pulmonary and cardiac evaluation.   Echo at [facility] LVEF of 10-15% with mod dilated LV, severe secondary MR, moderate to severe RV dysfunction with moderately enlarged RV, severe TR. She developed cardiogenic shock and was placed on milrinone, and briefly on low-dose NE. L/RHC (02/13/21): no CAD. She developed PMVT requiring shock x 2 and brief CPR. QTc long, amio stopped. Sustained another episode of PMVT, ivabradine stopped.   ICD placed by [facility] 12/18/21. No problems has healed well   Follow up 9/23, NYHA II, Toprol increased to 50 mg qhs.Today she returns for HF follow up with her mother. Still walking regularly 1-2 miles per day. No SOB, CP, edema. Compliant with medications. Has decided against genetic testing  Today she returns for HF follow up with her mother. Overall feeling fine. She is not SOB walking up steps. She walks 2-2.5 miles a day. increasing SOB, CP, dizziness, edema, or PND/Orthopnea. Appetite ok. No fever or chills. Weight at home 214 pounds. Taking all medications. Echo today 11/08/22, results .  Cardiac Studies - Echo (10/22/21): EF 20% RV moderately HK. G1DD. Trivial MR    - Echo (07/16/21): EF 20-25% RV normal. Mild to mod MR. Mild TR   - Echo (6/22): EF 10-15%, RV moderately enlarged with severely reduced systolic function, RVSP 123XX123 mmHg, severe secondary MR, mod-severe TR.  - R/LHC (7/22): No CAD, with CI 2.48 on milrinone 0.25. Mildly elevated  filling pressures, pulmonary venous hypertension. RA mean 9 RV 52/17 PA 53/17, mean 31 PCWP mean 16 LV 90/14 AO 90/63  Oxygen saturations: PA 57% AO 98%  Cardiac Output (Fick) 5.1  Cardiac Index (Fick) 2.48  PVR 2.9 WU  - cMRI (7/22): LVEF 18%, diffuse hypokinesis, LV thrombus, mod dilated RV with EF 15%, mild MR, subtle non-coronary mid-wall LGE pattern in the inferior and anterolateral walls, possible prior myocarditis.  Past Medical History:  Diagnosis Date   Encounter for menstrual regulation 01/24/2015   Hypothyroid    Migraines    Obesity    UTI (urinary tract infection)    Current Outpatient Medications  Medication Sig Dispense Refill   Accu-Chek Softclix Lancets lancets Use as directed up to 4 times daily 100 each 5   acetaminophen (TYLENOL) 500 MG tablet Take 1,000 mg by mouth every 6 (six) hours as needed for mild pain.     Blood Glucose Monitoring Suppl (ACCU-CHEK GUIDE) w/Device KIT Use as directed 1 kit 0   dapagliflozin propanediol (FARXIGA) 10 MG TABS tablet Take 1 tablet (10 mg total) by mouth daily. 90 tablet 3   digoxin (LANOXIN) 0.125 MG tablet Take 0.5 tablets (0.0625 mg total) by mouth daily. 45 tablet 2   ELIQUIS 5 MG TABS tablet TAKE 1 TABLET BY MOUTH TWICE DAILY 180 tablet 0   glucose blood (ACCU-CHEK GUIDE) test strip Use as instructed up to 4 times daily 100 each 12   levothyroxine (SYNTHROID) 100 MCG tablet Take 100 mcg by mouth daily before breakfast.  losartan (COZAAR) 50 MG tablet Take 1 tablet (50 mg total) by mouth at bedtime. 90 tablet 3   magnesium oxide (MAG-OX) 400 MG tablet Take 1 tablet (400 mg total) by mouth daily. 90 tablet 3   metoprolol succinate (TOPROL-XL) 50 MG 24 hr tablet Take 1 tablet (50 mg total) by mouth daily. Take with or immediately following a meal. 90 tablet 3   potassium chloride SA (KLOR-CON M) 20 MEQ tablet Take 2 tablets (40 mEq total) by mouth daily. 180 tablet 3   promethazine (PHENERGAN) 25 MG tablet Take 1  tablet (25 mg total) by mouth every 6 (six) hours as needed for nausea or vomiting. 30 tablet 0   spironolactone (ALDACTONE) 25 MG tablet Take 0.5 tablets (12.5 mg total) by mouth daily. 45 tablet 3   torsemide (DEMADEX) 20 MG tablet Take 1 tablet (20 mg total) by mouth daily. 90 tablet 2   norethindrone (MICRONOR) 0.35 MG tablet Take 1 tablet (0.35 mg total) by mouth daily. (Patient not taking: Reported on 11/08/2022) 28 tablet 11   No current facility-administered medications for this encounter.   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   Sulfa Antibiotics Hives   Ibuprofen Other (See Comments)    Constipation Can tolerate w milk of mag   Sacubitril-Valsartan Other (See Comments)    Lower her blood pressure to much    Social History   Socioeconomic History   Marital status: Single    Spouse name: Not on file   Number of children: Not on file   Years of education: Not on file   Highest education level: Not on file  Occupational History   Not on file  Tobacco Use   Smoking status: Never   Smokeless tobacco: Never  Vaping Use   Vaping Use: Never used  Substance and Sexual Activity   Alcohol use: No   Drug use: No   Sexual activity: Never  Other Topics Concern   Not on file  Social History Narrative   Not on file   Social Determinants of Health   Financial Resource Strain: Medium Risk (02/13/2021)   Overall Financial Resource Strain (CARDIA)    Difficulty of Paying Living Expenses: Somewhat hard  Food Insecurity: No Food Insecurity (02/13/2021)   Hunger Vital Sign    Worried About Running Out of Food in the Last Year: Never true    Ran Out of Food in the Last  Year: Never true  Transportation Needs: No Transportation Needs (02/13/2021)   PRAPARE - Hydrologist (Medical): No    Lack of Transportation (Non-Medical): No  Physical Activity: Insufficiently Active (07/25/2020)   Exercise Vital Sign    Days of Exercise per Week: 2 days    Minutes of Exercise per Session: 30 min  Stress: No Stress Concern Present (07/25/2020)   [place]    Feeling of Stress : Not at all  Social Connections: Socially Isolated (07/25/2020)   Social Connection and Isolation Panel [NHANES]    Frequency of Communication with Friends and Family: More than three times a week    Frequency of Social Gatherings with Friends  and Family: More than three times a week    Attends Religious Services: Never    Active Member of Clubs or Organizations: No    Attends Archivist Meetings: Never    Marital Status: Never married  Intimate Partner Violence: Not At Risk (07/25/2020)   Humiliation, Afraid, Rape, and Kick questionnaire    Fear of Current or Ex-Partner: No    Emotionally Abused: No    Physically Abused: No    Sexually Abused: No   Family History  Problem Relation Age of Onset   Hypertension Paternal Grandmother    Coronary artery disease Paternal Grandmother    Hypertension Maternal Grandmother    Diabetes Maternal Grandmother    Hyperlipidemia Maternal Grandmother    Thyroid disease Maternal Grandmother    Kidney disease Maternal Grandmother    Diabetes Father    Hypertension Mother    Hyperlipidemia Mother    COPD Paternal Grandfather    Heart disease Maternal Grandfather    Diabetes Maternal Grandfather    Hypertension Brother    Hyperlipidemia Brother    BP 108/76   Pulse 87   Wt 98.7 kg (217 lb 9.6 oz)   SpO2 100%   BMI 36.77 kg/m   Wt Readings from Last 3 Encounters:  11/08/22 98.7 kg (217 lb 9.6 oz)  05/03/22 94.1 kg (207 lb 6.4 oz)  04/01/22  92.2 kg (203 lb 4.8 oz)   PHYSICAL EXAM: General:  NAD. No resp difficulty, walked into clinic HEENT: Normal Neck: Supple. No JVD. Carotids 2+ bilat; no bruits. No lymphadenopathy or thryomegaly appreciated. Cor: PMI nondisplaced. Regular rate & rhythm. No rubs, gallops or murmurs. Lungs: Clear Abdomen: Soft, nontender, nondistended. No hepatosplenomegaly. No bruits or masses. Good bowel sounds. Extremities: No cyanosis, clubbing, rash, edema Neuro: Alert & oriented x 3, cranial nerves grossly intact. Moves all 4 extremities w/o difficulty. Affect pleasant.  Device interrogation (personally reviewed): HL score 0, average HR 97 bpm, 1.6 hr/day activity, no VT  ECG (personally reviewed): NSR 80 bpm, QTc 442 msec  ASSESSMENT & PLAN: 1. Chronic systolic HF  - Onset [id number] ? viral myocarditis vs familial cardiomyopathy (grandmother and aunts/uncles with CHF).   - Echo (6/22): EF of 10-15%, moderate to severely reduced RV fxn with mildly dilated RV, RVSP 45 mmHg, severe secondary MR, mod-severe TR. - cMRI (7/22): LV EF 18%, RV EF 26%, LV thrombus, Subtle non-coronary mid-wall LGE pattern in the inferior and anterolateral walls, possible prior myocarditis. - R/LHC (7/22): No CAD, with CI 2.48 on milrinone 0.25.   - Echo (12/22): EF 20-25% LV smaller Mild to mod MR. Mild TR - Echo (3/23): EF 20% Mildly dilated RV mod HK. G1DD. Triv MR - Echo today 11/08/22, results pending. - Stable NYHA II. Volume ok on exam and by device - Stop digoxin. - Continue Toprol XL 50 mg daily. - Continue torsemide 20 mg daily. - Continue losartan 50 mg daily (BP did not tolerate Entresto). - Continue spironolactone 12.5 mg daily. - Continue dapagliflozin 10 mg daily. No GU symptoms. - Off ivabradine with PMVT. - Has decided against genetic testing.  - ICD interrogated personally. HL-Score 0. No VT/AF   - Labs today  2. Mitral valve regurgitation, functional - Severe on echo this past admission, only looked mild  on cMRI however. - MR trivial on echo 3/23.   3. Moderate-severe TR - Trivial on last echo.   4. NSVT  - She is off amiodarone and ivabradine w/ prolonged QTc and subsequent  PMVT. - Now s/p [employer]i ICD  5. LV thrombus - Remains on apixaban. No bleeding issues.  Follow up in 6 months with Dr. Haroldine Laws.  [place], FNP 11/08/22  2:34 PM

## 2022-11-08 ENCOUNTER — Ambulatory Visit (HOSPITAL_COMMUNITY)
Admission: RE | Admit: 2022-11-08 | Discharge: 2022-11-08 | Disposition: A | Discharge status: Home / Self Care | Payer: Medicaid Other | Source: Ambulatory Visit | Attending: Family Medicine | Admitting: Family Medicine

## 2022-11-08 ENCOUNTER — Ambulatory Visit (HOSPITAL_BASED_OUTPATIENT_CLINIC_OR_DEPARTMENT_OTHER)
Admission: RE | Admit: 2022-11-08 | Discharge: 2022-11-08 | Disposition: A | Discharge status: Home / Self Care | Payer: Medicaid Other | Source: Ambulatory Visit | Attending: Family Medicine | Admitting: Family Medicine

## 2022-11-08 ENCOUNTER — Encounter (HOSPITAL_COMMUNITY): Payer: Self-pay

## 2022-11-08 VITALS — BP 108/76 | HR 87 | Wt 217.6 lb

## 2022-11-08 DIAGNOSIS — I428 Other cardiomyopathies: Secondary | ICD-10-CM | POA: Insufficient documentation

## 2022-11-08 DIAGNOSIS — Z7901 Long term (current) use of anticoagulants: Secondary | ICD-10-CM | POA: Diagnosis not present

## 2022-11-08 DIAGNOSIS — Z8249 Family history of ischemic heart disease and other diseases of the circulatory system: Secondary | ICD-10-CM | POA: Insufficient documentation

## 2022-11-08 DIAGNOSIS — I513 Intracardiac thrombosis, not elsewhere classified: Secondary | ICD-10-CM | POA: Diagnosis not present

## 2022-11-08 DIAGNOSIS — I5022 Chronic systolic (congestive) heart failure: Secondary | ICD-10-CM | POA: Insufficient documentation

## 2022-11-08 DIAGNOSIS — Z95810 Presence of automatic (implantable) cardiac defibrillator: Secondary | ICD-10-CM | POA: Insufficient documentation

## 2022-11-08 DIAGNOSIS — E119 Type 2 diabetes mellitus without complications: Secondary | ICD-10-CM | POA: Diagnosis not present

## 2022-11-08 DIAGNOSIS — I071 Rheumatic tricuspid insufficiency: Secondary | ICD-10-CM

## 2022-11-08 DIAGNOSIS — I4729 Other ventricular tachycardia: Secondary | ICD-10-CM | POA: Diagnosis not present

## 2022-11-08 DIAGNOSIS — Z833 Family history of diabetes mellitus: Secondary | ICD-10-CM | POA: Insufficient documentation

## 2022-11-08 DIAGNOSIS — E039 Hypothyroidism, unspecified: Secondary | ICD-10-CM | POA: Insufficient documentation

## 2022-11-08 DIAGNOSIS — Z79899 Other long term (current) drug therapy: Secondary | ICD-10-CM | POA: Insufficient documentation

## 2022-11-08 DIAGNOSIS — I472 Ventricular tachycardia, unspecified: Secondary | ICD-10-CM | POA: Diagnosis not present

## 2022-11-08 DIAGNOSIS — I081 Rheumatic disorders of both mitral and tricuspid valves: Secondary | ICD-10-CM | POA: Diagnosis not present

## 2022-11-08 DIAGNOSIS — I340 Nonrheumatic mitral (valve) insufficiency: Secondary | ICD-10-CM

## 2022-11-08 DIAGNOSIS — Z86711 Personal history of pulmonary embolism: Secondary | ICD-10-CM | POA: Diagnosis not present

## 2022-11-08 LAB — ECHOCARDIOGRAM COMPLETE
AR max vel: 2.16 cm2
AV Area VTI: 1.92 cm2
AV Area mean vel: 2.07 cm2
AV Mean grad: 3.5 mmHg
AV Peak grad: 6.5 mmHg
Ao pk vel: 1.27 m/s
Area-P 1/2: 3.17 cm2
Calc EF: 44.9 %
S' Lateral: 4 cm
Single Plane A2C EF: 46.5 %
Single Plane A4C EF: 41.6 %

## 2022-11-08 LAB — BASIC METABOLIC PANEL
Anion gap: 11 (ref 5–15)
BUN: 14 mg/dL (ref 6–20)
CO2: 24 mmol/L (ref 22–32)
Calcium: 9 mg/dL (ref 8.9–10.3)
Chloride: 102 mmol/L (ref 98–111)
Creatinine, Ser: 0.99 mg/dL (ref 0.44–1.00)
GFR, Estimated: >60 mL/min (ref >60)
Glucose, Bld: 95 mg/dL (ref 70–99)
Potassium: 3.5 mmol/L (ref 3.5–5.1)
Sodium: 137 mmol/L (ref 135–145)

## 2022-11-08 LAB — BRAIN NATRIURETIC PEPTIDE: B Natriuretic Peptide: 29.9 pg/mL (ref 0.0–100.0)

## 2022-11-08 MED ORDER — PERFLUTREN LIPID MICROSPHERE
1.0000 mL | INTRAVENOUS | Status: AC | PRN
  Administered 2022-11-08: 4 mL via INTRAVENOUS

## 2022-11-08 NOTE — Patient Instructions (Signed)
Thank you for coming in today  Labs were done today, if any labs are abnormal the clinic will call you No news is good news   EKG was done today  Medications: Stop digoxin     Follow up appointments:  Your physician recommends that you schedule a follow-up appointment in:  6 months with Dr. Haroldine Laws  You will receive a reminder letter in the mail a few months in advance. If you don't receive a letter, please call our office to schedule the follow-up appointment.    Do the following things EVERYDAY: Weigh yourself in the morning before breakfast. Write it down and keep it in a log. Take your medicines as prescribed Eat low salt foods--Limit salt (sodium) to 2000 mg per day.  Stay as active as you can everyday Limit all fluids for the day to less than 2 liters   At the [facility], you and your health needs are our priority. As part of our continuing mission to provide you with exceptional heart care, we have created designated Provider Care Teams. These Care Teams include your primary Cardiologist (physician) and Advanced Practice Providers (APPs- Physician Assistants and Nurse Practitioners) who all work together to provide you with the care you need, when you need it.   You may see any of the following providers on your designated Care Team at your next follow up: Dr Glori Bickers Dr Loralie Champagne Dr. Roxana Hires, NP Lyda Jester, [place] [facility] [place], [place] Forestine Na, NP Audry Riles, PharmD   Please be sure to bring in all your medications bottles to every appointment.    Thank you for choosing [facility]  If you have any questions or concerns before your next appointment please send [place] a message through [place] or call our office at [phone].    TO LEAVE A MESSAGE FOR THE NURSE SELECT OPTION 2, PLEASE LEAVE A MESSAGE INCLUDING: YOUR NAME DATE OF BIRTH CALL BACK  NUMBER REASON FOR CALL**this is important as we prioritize the call backs  YOU WILL RECEIVE A CALL BACK THE SAME DAY AS LONG AS YOU CALL BEFORE 4:00 PM

## 2022-11-11 ENCOUNTER — Other Ambulatory Visit (HOSPITAL_COMMUNITY): Payer: Self-pay

## 2022-12-18 ENCOUNTER — Ambulatory Visit (INDEPENDENT_AMBULATORY_CARE_PROVIDER_SITE_OTHER): Payer: Medicaid Other

## 2022-12-18 DIAGNOSIS — I5022 Chronic systolic (congestive) heart failure: Secondary | ICD-10-CM | POA: Diagnosis not present

## 2022-12-18 LAB — CUP PACEART REMOTE DEVICE CHECK
Battery Remaining Longevity: 168 mo
Battery Remaining Percentage: 100 %
Brady Statistic RV Percent Paced: 0 %
Date Time Interrogation Session: 20240508001100
HighPow Impedance: 87 Ohm
Implantable Lead Connection Status: 753985
Implantable Lead Implant Date: 20230509
Implantable Lead Location: 753860
Implantable Lead Model: 137
Implantable Lead Serial Number: 301159
Implantable Pulse Generator Implant Date: 20230509
Lead Channel Impedance Value: 622 Ohm
Lead Channel Pacing Threshold Amplitude: 1 V
Lead Channel Pacing Threshold Pulse Width: 0.4 ms
Lead Channel Setting Pacing Amplitude: 2.5 V
Lead Channel Setting Pacing Pulse Width: 0.4 ms
Lead Channel Setting Sensing Sensitivity: 0.5 mV
Pulse Gen Serial Number: 216786
Zone Setting Status: 755011

## 2022-12-19 ENCOUNTER — Other Ambulatory Visit (HOSPITAL_COMMUNITY): Payer: Self-pay | Admitting: Internal Medicine

## 2023-01-08 NOTE — Progress Notes (Signed)
Remote ICD transmission.   

## 2023-01-14 ENCOUNTER — Other Ambulatory Visit (INDEPENDENT_AMBULATORY_CARE_PROVIDER_SITE_OTHER): Payer: Medicaid Other

## 2023-01-14 ENCOUNTER — Other Ambulatory Visit (HOSPITAL_COMMUNITY)
Admission: RE | Admit: 2023-01-14 | Discharge: 2023-01-14 | Disposition: A | Discharge status: Home / Self Care | Payer: Medicaid Other | Source: Ambulatory Visit | Attending: Obstetrics & Gynecology | Admitting: Obstetrics & Gynecology

## 2023-01-14 DIAGNOSIS — R300 Dysuria: Secondary | ICD-10-CM | POA: Diagnosis not present

## 2023-01-14 DIAGNOSIS — N898 Other specified noninflammatory disorders of vagina: Secondary | ICD-10-CM | POA: Diagnosis not present

## 2023-01-14 LAB — POCT URINALYSIS DIPSTICK OB
Glucose, UA: NEGATIVE
Ketones, UA: NEGATIVE
Nitrite, UA: NEGATIVE
POC,PROTEIN,UA: NEGATIVE

## 2023-01-14 NOTE — Progress Notes (Signed)
   NURSE VISIT- UTI SYMPTOMS   SUBJECTIVE:  Debra Daniel is a 37 y.o. G0P0 female here for UTI symptoms. She is a GYN patient. She reports dysuria, vaginal itching and discharge that started 3 days ago. Took AZO for yeast but it has not helped.   OBJECTIVE:  There were no vitals taken for this visit.  Appears well, in no apparent distress  Results for orders placed or performed in visit on 01/14/23 (from the past 24 hour(s))  POC Urinalysis Dipstick OB   Collection Time: 01/14/23  4:27 PM  Result Value Ref Range   Color, UA     Clarity, UA     Glucose, UA Negative Negative   Bilirubin, UA     Ketones, UA negative    Spec Grav, UA     Blood, UA trace    pH, UA     POC,PROTEIN,UA Negative Negative, Trace, Small (1+), Moderate (2+), Large (3+), 4+   Urobilinogen, UA     Nitrite, UA negative    Leukocytes, UA Small (1+) (A) Negative   Appearance     Odor      ASSESSMENT: GYN patient with UTI symptoms and negative nitrites  PLAN: Note routed to Cyril Mourning, AGNP   Rx sent by provider today: No Urine culture sent CV swab sent for BV, yeast, GC/CHL, trich Call or return to clinic prn if these symptoms worsen or fail to improve as anticipated. Follow-up: as needed   Jobe Marker  01/14/2023 4:30 PM

## 2023-01-15 LAB — URINALYSIS, ROUTINE W REFLEX MICROSCOPIC
Bilirubin, UA: NEGATIVE
Nitrite, UA: NEGATIVE
Protein,UA: NEGATIVE
Specific Gravity, UA: 1.02 (ref 1.005–1.030)
pH, UA: 5 (ref 5.0–7.5)

## 2023-01-15 LAB — MICROSCOPIC EXAMINATION: Casts: NONE SEEN /lpf

## 2023-01-16 ENCOUNTER — Other Ambulatory Visit (HOSPITAL_COMMUNITY): Payer: Self-pay | Admitting: Internal Medicine

## 2023-01-16 ENCOUNTER — Other Ambulatory Visit: Payer: Self-pay | Admitting: Adult Health

## 2023-01-16 ENCOUNTER — Other Ambulatory Visit (HOSPITAL_COMMUNITY): Payer: Self-pay

## 2023-01-16 LAB — MICROSCOPIC EXAMINATION
RBC, Urine: NONE SEEN /HPF (ref 0–2)
WBC, UA: >30 /hpf — AB (ref 0–5)

## 2023-01-16 LAB — CERVICOVAGINAL ANCILLARY ONLY
Bacterial Vaginitis (gardnerella): NEGATIVE
Candida Glabrata: NEGATIVE
Candida Vaginitis: POSITIVE — AB
Chlamydia: NEGATIVE
Comment: NEGATIVE
Comment: NEGATIVE
Comment: NEGATIVE
Comment: NEGATIVE
Comment: NEGATIVE
Comment: NORMAL
Neisseria Gonorrhea: NEGATIVE
Trichomonas: NEGATIVE

## 2023-01-16 LAB — URINE CULTURE

## 2023-01-16 LAB — URINALYSIS, ROUTINE W REFLEX MICROSCOPIC
Ketones, UA: NEGATIVE
RBC, UA: NEGATIVE
Urobilinogen, Ur: 0.2 mg/dL (ref 0.2–1.0)

## 2023-01-16 LAB — SPECIMEN STATUS REPORT

## 2023-01-16 MED ORDER — FLUCONAZOLE 150 MG PO TABS: ORAL_TABLET | ORAL | 1 refills | Status: DC

## 2023-01-16 MED ORDER — SPIRONOLACTONE 25 MG PO TABS: 12.5000 mg | ORAL_TABLET | Freq: Every day | ORAL | 3 refills | Status: DC

## 2023-01-16 NOTE — Progress Notes (Signed)
+  yeast on vaginal swab, will rx diflucan  ?

## 2023-02-09 ENCOUNTER — Encounter: Payer: Self-pay | Admitting: Obstetrics & Gynecology

## 2023-02-10 ENCOUNTER — Other Ambulatory Visit: Payer: Self-pay | Admitting: Adult Health

## 2023-02-17 ENCOUNTER — Encounter: Payer: Self-pay | Admitting: Internal Medicine

## 2023-02-20 ENCOUNTER — Telehealth: Payer: Self-pay | Admitting: *Deleted

## 2023-02-20 NOTE — Telephone Encounter (Signed)
Person [facility], the dentist called back to our pre op team and I was able to clarify procedure.   Procedure will be just 1 tooth extraction  Will proceed as Surgical Extraction though may end up only being a Simple Extraction  Anesthesia: Local

## 2023-02-20 NOTE — Telephone Encounter (Signed)
   Pre-operative Risk Assessment    Patient Name: Debra Daniel  DOB: Dec 28, 1985 MRN: [id number]     Request for Surgical Clearance    Procedure:  Dental Extraction - Amount of Teeth to be Pulled:  1 TOOTH EXTRACTION ; LEFT MESSAGE FOR DDS TO CONFIRM IF ONLY ONE TOOTH BEING EXTRACTED; AS WELL IF A SIMPLE OR SURGICAL EXTRACTION  Date of Surgery:  Clearance TBD                                 Surgeon:  NOT LISTED Surgeon's Group or Practice Name:  [facility] Phone number:  [phone] Fax number:  [phone]   Type of Clearance Requested:   - Medical  - Pharmacy:  Hold Apixaban (Eliquis)     Type of Anesthesia:  Not Indicated; LEFT MESSAGE TO CALL BACK WITH ANESTHESIA TO BE USED   Additional requests/questions:    Elpidio Anis   02/20/2023, 10:23 AM

## 2023-02-21 NOTE — Telephone Encounter (Signed)
We do not routinely hold anticoagulation for 1 -2 dental extractions

## 2023-02-21 NOTE — Telephone Encounter (Signed)
    Primary Cardiologist: None  Chart reviewed as part of pre-operative protocol coverage. Simple dental extractions (1-2 teeth) are considered low risk procedures per guidelines and generally do not require any specific cardiac clearance. It is also generally accepted that for simple extractions and dental cleanings, there is no need to interrupt blood thinner therapy.   SBE prophylaxis is not required for the patient.  I will route this recommendation to the requesting party via Epic fax function and remove from pre-op pool.  Please call with questions.  Sharlene Dory, PA-C 02/21/2023, 4:41 PM

## 2023-03-16 ENCOUNTER — Other Ambulatory Visit (HOSPITAL_COMMUNITY): Payer: Self-pay | Admitting: Internal Medicine

## 2023-03-19 ENCOUNTER — Ambulatory Visit (INDEPENDENT_AMBULATORY_CARE_PROVIDER_SITE_OTHER): Payer: Medicaid Other

## 2023-03-19 DIAGNOSIS — I5022 Chronic systolic (congestive) heart failure: Secondary | ICD-10-CM | POA: Diagnosis not present

## 2023-03-19 LAB — CUP PACEART REMOTE DEVICE CHECK
Battery Remaining Longevity: 168 mo
Battery Remaining Percentage: 100 %
Brady Statistic RV Percent Paced: 0 %
Date Time Interrogation Session: 20240807005100
HighPow Impedance: 84 Ohm
Implantable Lead Connection Status: 753985
Implantable Lead Implant Date: 20230509
Implantable Lead Location: 753860
Implantable Lead Model: 137
Implantable Lead Serial Number: 301159
Implantable Pulse Generator Implant Date: 20230509
Lead Channel Impedance Value: 633 Ohm
Lead Channel Pacing Threshold Amplitude: 0.8 V
Lead Channel Pacing Threshold Pulse Width: 0.4 ms
Lead Channel Setting Pacing Amplitude: 2.5 V
Lead Channel Setting Pacing Pulse Width: 0.4 ms
Lead Channel Setting Sensing Sensitivity: 0.5 mV
Pulse Gen Serial Number: 216786
Zone Setting Status: 755011

## 2023-03-31 ENCOUNTER — Ambulatory Visit (INDEPENDENT_AMBULATORY_CARE_PROVIDER_SITE_OTHER): Payer: Medicaid Other | Admitting: Obstetrics & Gynecology

## 2023-03-31 ENCOUNTER — Encounter: Payer: Self-pay | Admitting: Obstetrics & Gynecology

## 2023-03-31 VITALS — BP 97/70 | HR 82 | Ht 65.0 in | Wt 214.0 lb

## 2023-03-31 DIAGNOSIS — N921 Excessive and frequent menstruation with irregular cycle: Secondary | ICD-10-CM | POA: Diagnosis not present

## 2023-03-31 MED ORDER — NORETHINDRONE 0.35 MG PO TABS: 1.0000 | ORAL_TABLET | Freq: Every day | ORAL | 11 refills | Status: DC

## 2023-03-31 NOTE — Progress Notes (Signed)
Follow up appointment for response: On micronor for cycle control  Chief Complaint  Patient presents with   Follow-up    Blood pressure 97/70, pulse 82, height 5\' 5"  (1.651 m), weight 214 lb (97.1 kg).  Has a history of PE on eliquis Had menorrhagia with irregular cycle, synchronized with megestrol but now Have switched to micronor for ongoing management with good effect  Pt would like to continue  Declines exam today  MEDS ordered this encounter: Meds ordered this encounter  Medications   norethindrone (MICRONOR) 0.35 MG tablet    Sig: Take 1 tablet (0.35 mg total) by mouth daily.    Dispense:  28 tablet    Refill:  11    Orders for this encounter: No orders of the defined types were placed in this encounter.   Impression + Management Plan   ICD-10-CM   1. Menorrhagia with irregular cycle(on chronic anti coagulation)  N92.1    managed effectively on micronor chronically      Follow Up: No follow-ups on file.     All questions were answered.  Past Medical History:  Diagnosis Date   Encounter for menstrual regulation 01/24/2015   Hypothyroid    Migraines    Obesity    UTI (urinary tract infection)     Past Surgical History:  Procedure Laterality Date   broken collar bone  1994   ICD IMPLANT N/A 12/18/2021   Procedure: ICD IMPLANT;  Surgeon: Marinus Maw, MD;  Location: [facility] INVASIVE CV LAB;  Service: Cardiovascular;  Laterality: N/A;   RIGHT/LEFT HEART CATH AND CORONARY ANGIOGRAPHY N/A 02/13/2021   Procedure: RIGHT/LEFT HEART CATH AND CORONARY ANGIOGRAPHY;  Surgeon: Laurey Morale, MD;  Location: [facility] INVASIVE CV LAB;  Service: Cardiovascular;  Laterality: N/A;    OB History     Gravida  0   Para      Term      Preterm      AB      Living         SAB      IAB      Ectopic      Multiple      Live Births              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   Sulfa  Antibiotics Hives   Ibuprofen Other (See Comments)    Constipation Can tolerate w milk of mag   Sacubitril-Valsartan Other (See Comments)    Lower her blood pressure to much    Social History   Socioeconomic History   Marital status: Single    Spouse name: Not on file   Number of children: Not on file   Years of education: Not on file   Highest education level: Not on file  Occupational History   Not on file  Tobacco Use   Smoking status: Never   Smokeless tobacco: Never  Vaping Use   Vaping status: Never Used  Substance and Sexual Activity   Alcohol use: No   Drug use: No   Sexual activity: Never  Other Topics Concern   Not on file  Social History Narrative   Not on file   Social Determinants of Health   Financial Resource Strain: Medium Risk (02/13/2021)   Overall Financial Resource Strain (CARDIA)    Difficulty of Paying Living Expenses: Somewhat hard  Food Insecurity: No Food Insecurity (02/13/2021)   Hunger Vital Sign  Worried About Programme researcher, broadcasting/film/video in the Last Year: Never true    Ran Out of Food in the Last Year: Never true  Transportation Needs: No Transportation Needs (02/13/2021)   PRAPARE - Administrator, Civil Service (Medical): No    Lack of Transportation (Non-Medical): No  Physical Activity: Insufficiently Active (07/25/2020)   Exercise Vital Sign    Days of Exercise per Week: 2 days    Minutes of Exercise per Session: 30 min  Stress: No Stress Concern Present (07/25/2020)   [employer] of Occupational Health - Occupational Stress Questionnaire    Feeling of Stress : Not at all  Social Connections: Socially Isolated (07/25/2020)   Social Connection and Isolation Panel [NHANES]    Frequency of Communication with Friends and Family: More than three times a week    Frequency of Social Gatherings with Friends and Family: More than three times a week    Attends Religious Services: Never    Database administrator or Organizations: No     Attends Engineer, structural: Never    Marital Status: Never married    Family History  Problem Relation Age of Onset   Hypertension Paternal Grandmother    Coronary artery disease Paternal Grandmother    Hypertension Maternal Grandmother    Diabetes Maternal Grandmother    Hyperlipidemia Maternal Grandmother    Thyroid disease Maternal Grandmother    Kidney disease Maternal Grandmother    Diabetes Father    Hypertension Mother    Hyperlipidemia Mother    COPD Paternal Grandfather    Heart disease Maternal Grandfather    Diabetes Maternal Grandfather    Hypertension Brother    Hyperlipidemia Brother

## 2023-04-03 NOTE — Progress Notes (Signed)
Remote ICD transmission.   

## 2023-04-13 ENCOUNTER — Other Ambulatory Visit (HOSPITAL_COMMUNITY): Payer: Self-pay | Admitting: Internal Medicine

## 2023-04-15 ENCOUNTER — Encounter: Payer: Self-pay | Admitting: Internal Medicine

## 2023-04-15 ENCOUNTER — Ambulatory Visit: Payer: Medicaid Other | Attending: Internal Medicine | Admitting: Internal Medicine

## 2023-04-15 VITALS — BP 128/84 | HR 78 | Ht 64.5 in | Wt 213.0 lb

## 2023-04-15 DIAGNOSIS — I5022 Chronic systolic (congestive) heart failure: Secondary | ICD-10-CM | POA: Diagnosis not present

## 2023-04-15 DIAGNOSIS — I4729 Other ventricular tachycardia: Secondary | ICD-10-CM

## 2023-04-15 LAB — CUP PACEART INCLINIC DEVICE CHECK
Date Time Interrogation Session: 20240903120623
HighPow Impedance: 102 Ohm
Implantable Lead Connection Status: 753985
Implantable Lead Implant Date: 20230509
Implantable Lead Location: 753860
Implantable Lead Model: 137
Implantable Lead Serial Number: 301159
Implantable Pulse Generator Implant Date: 20230509
Lead Channel Impedance Value: 695 Ohm
Lead Channel Pacing Threshold Amplitude: 0.8 V
Lead Channel Pacing Threshold Pulse Width: 0.4 ms
Lead Channel Sensing Intrinsic Amplitude: 18 mV
Lead Channel Setting Pacing Amplitude: 2.5 V
Lead Channel Setting Pacing Pulse Width: 0.4 ms
Lead Channel Setting Sensing Sensitivity: 0.5 mV
Pulse Gen Serial Number: 216786
Zone Setting Status: 755011

## 2023-04-15 NOTE — Progress Notes (Signed)
HPI Debra Daniel returns today for followup. She is a pleasant 37 yo woman with a h/o non-ischemic CM and chronic systolic heart failure diagnosed in June. She has been treated with GDMT and has undergone repeat 2D echo to evaluation of her LV function in March which demonstrated persistently decreased LV function with an EF of 30%. She underwent ICD insertion about 3 months ago. In the interim, she notes that she feels well with no chest pain or sob or syncope. No ICD therapies.  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     Blood Glucose Monitoring Suppl (ACCU-CHEK GUIDE) w/Device KIT Use as directed 1 kit 0   ELIQUIS 5 MG TABS tablet TAKE 1 TABLET BY MOUTH TWICE DAILY 180 tablet 3   FARXIGA 10 MG TABS tablet TAKE 1 TABLET BY MOUTH ONCE DAILY. 90 tablet 3   fluconazole (DIFLUCAN) 150 MG tablet TAKE 1 TABLET NOW, MAY REPEAT IN 3 DAYS. 2 tablet 1   glucose blood (ACCU-CHEK GUIDE) test strip Use as instructed up to 4 times daily 100 each 12   levothyroxine (SYNTHROID) 100 MCG tablet Take 100 mcg by mouth daily before breakfast.     losartan (COZAAR) 50 MG tablet Take 1 tablet (50 mg total) by mouth at bedtime. 90 tablet 3   magnesium oxide (MAG-OX) 400 (240 Mg) MG tablet TAKE ONE TABLET BY MOUTH EVERY DAY 90 tablet 3   metoprolol succinate (TOPROL-XL) 50 MG 24 hr tablet Take 1 tablet (50 mg total) by mouth daily. Take with or immediately following a meal. 90 tablet 3   norethindrone (MICRONOR)  0.35 MG tablet Take 1 tablet (0.35 mg total) by mouth daily. 28 tablet 11   norethindrone (MICRONOR) 0.35 MG tablet Take 1 tablet (0.35 mg total) by mouth daily. 28 tablet 11   potassium chloride SA (KLOR-CON M) 20 MEQ tablet Take 2 tablets (40 mEq total) by mouth daily. 180 tablet 3   promethazine (PHENERGAN) 25 MG tablet Take 1 tablet (25 mg total) by mouth every 6 (six) hours as needed for nausea or vomiting. 30 tablet 0   spironolactone (ALDACTONE) 25 MG tablet Take 0.5 tablets (12.5 mg total) by mouth daily. 45 tablet 3   torsemide (DEMADEX) 20 MG tablet TAKE ONE TABLET BY MOUTH EVERY DAY 90 tablet 2   No current facility-administered medications for this visit.     Past Medical History:  Diagnosis Date   Encounter for menstrual regulation 01/24/2015   Hypothyroid    Migraines    Obesity    UTI (urinary tract infection)     ROS:   All systems reviewed and negative except as noted in the HPI.   Past Surgical History:  Procedure Laterality Date   broken collar bone  1994   ICD IMPLANT N/A 12/18/2021   Procedure: ICD IMPLANT;  Surgeon: Marinus Maw, MD;  Location: [facility] INVASIVE CV LAB;  Service: Cardiovascular;  Laterality: N/A;   RIGHT/LEFT HEART CATH AND CORONARY ANGIOGRAPHY N/A 02/13/2021   Procedure: RIGHT/LEFT HEART CATH AND CORONARY ANGIOGRAPHY;  Surgeon: Laurey Morale, MD;  Location: [facility] INVASIVE CV LAB;  Service: Cardiovascular;  Laterality: N/A;     Family History  Problem Relation Age of Onset   Hypertension Paternal Grandmother    Coronary artery disease Paternal Grandmother    Hypertension Maternal Grandmother    Diabetes Maternal Grandmother    Hyperlipidemia Maternal Grandmother    Thyroid disease Maternal Grandmother    Kidney disease Maternal Grandmother    Diabetes Father    Hypertension Mother    Hyperlipidemia Mother    COPD Paternal Grandfather    Heart disease Maternal Grandfather    Diabetes Maternal Grandfather    Hypertension Brother     Hyperlipidemia Brother      Social History   Socioeconomic History   Marital status: Single    Spouse name: Not on file   Number of children: Not on file   Years of education: Not on file   Highest education level: Not on file  Occupational History   Not on file  Tobacco Use   Smoking status: Never   Smokeless tobacco: Never  Vaping Use   Vaping status: Never Used  Substance and Sexual Activity   Alcohol use: No   Drug use: No   Sexual activity: Never  Other Topics Concern   Not on file  Social History Narrative   Not on file   Social Determinants of Health   Financial Resource Strain: Medium Risk (02/13/2021)   Overall Financial Resource Strain (CARDIA)    Difficulty of Paying Living Expenses: Somewhat hard  Food Insecurity: No Food Insecurity (02/13/2021)   Hunger Vital Sign    Worried About Running Out of Food in the Last Year: Never true    Ran Out of Food in the Last Year: Never true  Transportation Needs: No Transportation Needs (02/13/2021)   PRAPARE - Administrator, Civil Service (Medical): No    Lack of Transportation (Non-Medical): No  Physical Activity: Insufficiently Active (07/25/2020)   Exercise Vital Sign    Days of Exercise per Week: 2 days    Minutes of Exercise per Session: 30 min  Stress: No Stress Concern Present (07/25/2020)   [employer] of Occupational Health - Occupational Stress Questionnaire    Feeling of Stress : Not at all  Social Connections: Socially Isolated (07/25/2020)   Social Connection and Isolation Panel [NHANES]    Frequency of Communication with Friends and Family: More than three times a week    Frequency of Social Gatherings with Friends and Family: More than three times a week    Attends Religious Services: Never    Database administrator or Organizations: No    Attends Banker Meetings: Never    Marital Status: Never married  Intimate Partner Violence: Not At Risk (07/25/2020)   Humiliation,  Afraid, Rape, and Kick questionnaire    Fear of Current or Ex-Partner: No    Emotionally Abused: No    Physically Abused: No    Sexually Abused: No     BP 128/84   Pulse 78   Ht 5' 4.5" (1.638 m)   Wt 213 lb (96.6 kg)   SpO2 98%   BMI 36.00 kg/m   Physical Exam:  Well appearing NAD HEENT: Unremarkable Neck:  No JVD, no thyromegally Lymphatics:  No adenopathy Back:  No CVA tenderness Lungs:  Clear  HEART:  Regular rate rhythm, no murmurs, no rubs, no clicks Abd:  soft, positive bowel sounds, no organomegally, no rebound, no guarding Ext:  2 plus pulses, no edema, no cyanosis, no clubbing Skin:  No rashes no nodules Neuro:  CN II through XII intact, motor grossly intact  EKG  DEVICE  Normal device function.  See PaceArt for details.   Assess/Plan:  Chronic systolic heart failure - she appears to be on GDMT. She has undergone a 2D echo in March and her EF is 30%. She will continue her current meds. NSVT - she has not had any since her ICD was placed. LV thrombus - She will continue Apixaban.  Obesity - we will work on weight loss. Her weight is up 10 lbs.    Debra Gowda Jiayi Lengacher,MD

## 2023-04-15 NOTE — Patient Instructions (Signed)

## 2023-04-15 NOTE — Progress Notes (Addendum)
Advanced Heart Failure Clinic Note    PCP: [facility] Dept. HF Cardiologist: Dr. Gala Romney  HPI: Debra Daniel is a 37 y.o. with systolic heart failure due to NICM, DM2 hypothyroidism, LV thrombus & PE on Eliquis.    Presented to the ED in [facility] 02/05/21 w/ hemoptysis.  CTA chest/abdomen/pelvis with acute RLL segmental and subsegmental PE, consolidation in bilateral lower lobes, diffuse anasarca. OCPs were stopped. Echo showed EF of 25-30%, moderate to severe MR, moderate TR. She was transferred to [facility] for additional pulmonary and cardiac evaluation.   Echo at [facility] LVEF of 10-15% with mod dilated LV, severe secondary MR, moderate to severe RV dysfunction with moderately enlarged RV, severe TR. She developed cardiogenic shock and was placed on milrinone, and briefly on low-dose NE. L/RHC (02/13/21): no CAD. She developed PMVT requiring shock x 2 and brief CPR. QTc long, amio stopped. Sustained another episode of PMVT, ivabradine stopped.   ICD placed by [facility] 12/18/21. No problems has healed well   Follow up 9/23, NYHA II, Toprol increased to 50 mg qhs. Decided against genetic testing  Echo 3/24 EF 20-25%, moderate LVH, RV moderately reduced  Today she returns for HF follow up with her mother. Overall feeling fine. She is not SOB with activity. Denies palpitations, abnormal bleeding, CP, dizziness, edema, or PND/Orthopnea. Appetite ok. No fever or chills. Weight at home stable. Taking all medications.   Cardiac Studies - Echo (3/24): EF 20-25%, moderate LVH, RV moderately reduced  - Echo (10/22/21): EF 20% RV moderately HK. G1DD. Trivial MR    - Echo (07/16/21): EF 20-25% RV normal. Mild to mod MR. Mild TR   - Echo (6/22): EF 10-15%, RV moderately enlarged with severely reduced systolic function, RVSP 45.9 mmHg, severe secondary MR, mod-severe TR.  - R/LHC (7/22): No CAD, with CI 2.48 on milrinone 0.25. Mildly elevated filling pressures, pulmonary venous hypertension. RA mean  9 RV 52/17 PA 53/17, mean 31 PCWP mean 16 LV 90/14 AO 90/63  Oxygen saturations: PA 57% AO 98%  Cardiac Output (Fick) 5.1  Cardiac Index (Fick) 2.48  PVR 2.9 WU  - cMRI (7/22): LVEF 18%, diffuse hypokinesis, LV thrombus, mod dilated RV with EF 15%, mild MR, subtle non-coronary mid-wall LGE pattern in the inferior and anterolateral walls, possible prior myocarditis.  Past Medical History:  Diagnosis Date   Encounter for menstrual regulation 01/24/2015   Hypothyroid    Migraines    Obesity    UTI (urinary tract infection)    Current Outpatient Medications  Medication Sig Dispense Refill   Accu-Chek Softclix Lancets lancets Use as directed up to 4 times daily 100 each 5   acetaminophen (TYLENOL) 500 MG tablet Take 1,000 mg by mouth every 6 (six) hours as needed for mild pain.     Blood Glucose Monitoring Suppl (ACCU-CHEK GUIDE) w/Device KIT Use as directed 1 kit 0   ELIQUIS 5 MG TABS tablet TAKE 1 TABLET BY MOUTH TWICE DAILY 180 tablet 3   FARXIGA 10 MG TABS tablet TAKE 1 TABLET BY MOUTH ONCE DAILY. 90 tablet 3   fluconazole (DIFLUCAN) 150 MG tablet TAKE 1 TABLET NOW, MAY REPEAT IN 3 DAYS. 2 tablet 1   glucose blood (ACCU-CHEK GUIDE) test strip Use as instructed up to 4 times daily 100 each 12   levothyroxine (SYNTHROID) 100 MCG tablet Take 100 mcg by mouth daily before breakfast.     losartan (COZAAR) 50 MG tablet TAKE ONE TABLET BY MOUTH AT BEDTIME 90 tablet 3  magnesium oxide (MAG-OX) 400 (240 Mg) MG tablet TAKE ONE TABLET BY MOUTH EVERY DAY 90 tablet 3   metoprolol succinate (TOPROL-XL) 50 MG 24 hr tablet TAKE ONE TABLET BY MOUTH EVERY DAY (TAKE WITH OR IMMEDIATELY FOLLOWING A MEAL) 90 tablet 3   norethindrone (MICRONOR) 0.35 MG tablet Take 1 tablet (0.35 mg total) by mouth daily. 28 tablet 11   norethindrone (MICRONOR) 0.35 MG tablet Take 1 tablet (0.35 mg total) by mouth daily. 28 tablet 11   potassium chloride SA (KLOR-CON M) 20 MEQ tablet Take 2 tablets (40 mEq total) by  mouth daily. 180 tablet 3   spironolactone (ALDACTONE) 25 MG tablet Take 0.5 tablets (12.5 mg total) by mouth daily. 45 tablet 3   torsemide (DEMADEX) 20 MG tablet TAKE ONE TABLET BY MOUTH EVERY DAY 90 tablet 2   No current facility-administered medications for this encounter.   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   Sulfa Antibiotics Hives   Ibuprofen Other (See Comments)    Constipation Can tolerate w milk of mag   Sacubitril-Valsartan Other (See Comments)    Lower her blood pressure to much    Social History   Socioeconomic History   Marital status: Single    Spouse name: Not on file   Number of children: Not on file   Years of education: Not on file   Highest education level: Not on file  Occupational History   Not on file  Tobacco Use   Smoking status: Never   Smokeless tobacco: Never  Vaping Use   Vaping status: Never Used  Substance and Sexual Activity   Alcohol use: No   Drug use: No   Sexual activity: Never  Other Topics Concern   Not on file  Social History Narrative   Not on file   Social Determinants of Health   Financial Resource Strain: Medium Risk (02/13/2021)   Overall Financial Resource Strain (CARDIA)    Difficulty of Paying Living Expenses: Somewhat hard  Food Insecurity: No Food Insecurity (02/13/2021)   Hunger Vital Sign    Worried About Running Out of Food in the Last Year: Never true    Ran Out of Food in the Last Year: Never true  Transportation Needs: No Transportation Needs (02/13/2021)   PRAPARE - Administrator, Civil Service (Medical): No    Lack of Transportation (Non-Medical): No  Physical Activity: Insufficiently Active (07/25/2020)   Exercise Vital Sign    Days of Exercise per Week: 2 days    Minutes of Exercise per Session: 30 min  Stress: No Stress Concern Present (07/25/2020)   [employer] of Occupational Health - Occupational Stress Questionnaire     Feeling of Stress : Not at all  Social Connections: Socially Isolated (07/25/2020)   Social Connection and Isolation Panel [NHANES]    Frequency of Communication with Friends and Family: More than three times a week    Frequency of Social Gatherings with Friends and Family: More than three times a week    Attends Religious Services: Never    Database administrator or Organizations: No    Attends Banker Meetings: Never    Marital Status: Never married  Intimate Partner Violence: Not At Risk (07/25/2020)   Humiliation, Afraid, Rape, and Kick questionnaire    Fear of Current or Ex-Partner: No    Emotionally Abused: No    Physically Abused: No  Sexually Abused: No   Family History  Problem Relation Age of Onset   Hypertension Paternal Grandmother    Coronary artery disease Paternal Grandmother    Hypertension Maternal Grandmother    Diabetes Maternal Grandmother    Hyperlipidemia Maternal Grandmother    Thyroid disease Maternal Grandmother    Kidney disease Maternal Grandmother    Diabetes Father    Hypertension Mother    Hyperlipidemia Mother    COPD Paternal Grandfather    Heart disease Maternal Grandfather    Diabetes Maternal Grandfather    Hypertension Brother    Hyperlipidemia Brother    BP 118/78   Pulse 88   Wt 97.7 kg (215 lb 6.4 oz)   SpO2 99%   BMI 36.40 kg/m   Wt Readings from Last 3 Encounters:  04/18/23 97.7 kg (215 lb 6.4 oz)  04/15/23 96.6 kg (213 lb)  03/31/23 97.1 kg (214 lb)   PHYSICAL EXAM: General:  NAD. No resp difficulty, walked into clinic HEENT: Normal Neck: Supple. No JVD. Carotids 2+ bilat; no bruits. No lymphadenopathy or thryomegaly appreciated. Cor: PMI nondisplaced. Regular rate & rhythm. No rubs, gallops or murmurs. Lungs: Clear Abdomen: Soft, nontender, nondistended. No hepatosplenomegaly. No bruits or masses. Good bowel sounds. Extremities: No cyanosis, clubbing, rash, edema Neuro: Alert & oriented x 3, cranial nerves  grossly intact. Moves all 4 extremities w/o difficulty. Affect pleasant.  Device interrogation (personally reviewed from 04/15/23): HL score 2, average HR 92 bpm, 1.2 hr/day activity, no VT  ECG (personally reviewed from 04/15/23): NSR 78 bpm, QTc 465 msec  ASSESSMENT & PLAN: 1. Chronic systolic HF  - Onset 6/22 ? viral myocarditis vs familial cardiomyopathy (grandmother and aunts/uncles with CHF).   - Echo (6/22): EF of 10-15%, moderate to severely reduced RV fxn with mildly dilated RV, RVSP 45 mmHg, severe secondary MR, mod-severe TR. - cMRI (7/22): LV EF 18%, RV EF 26%, LV thrombus, Subtle non-coronary mid-wall LGE pattern in the inferior and anterolateral walls, possible prior myocarditis. - R/LHC (7/22): No CAD, with CI 2.48 on milrinone 0.25.   - Echo (12/22): EF 20-25% LV smaller Mild to mod MR. Mild TR - Echo (3/23): EF 20% Mildly dilated RV mod HK. G1DD. Triv MR - Echo (3/24): EF 20-25%, moderate LVH, RV moderately reduced - Stable NYHA I-II. Volume ok on exam and by device - Continue Toprol XL 50 mg daily. - Continue torsemide 20 mg daily + 40 KCL daily. - Continue losartan 50 mg daily (BP did not tolerate Entresto). - Continue spironolactone 12.5 mg daily. - Continue dapagliflozin 10 mg daily. No GU symptoms. - Off ivabradine with PMVT. - Has decided against genetic testing.  - She is contracepting with OCPs and understands fetotoxicity of her GDMT. - ICD interrogated personally. HL-Score 3. No VT/AF   - Labs today  2. Mitral valve regurgitation, functional - Severe on echo this past admission, only looked mild on cMRI however. - MR trivial on echo 3/23. - Resolved on 3/24 echo   3. Moderate-Severe TR - Trivial on last echo.  4. NSVT  - She is off amiodarone and ivabradine w/ prolonged QTc and subsequent PMVT. - Now s/p [employer] ICD  5. LV thrombus - Remains on apixaban.  - No bleeding issues.  6. Iron Deficiency - On oral Fe - Check iron panel today  Follow up in 6  months with Dr. Gala Romney.  [place], FNP 04/18/23  1:33 PM

## 2023-04-16 ENCOUNTER — Other Ambulatory Visit (HOSPITAL_COMMUNITY): Payer: Self-pay

## 2023-04-18 ENCOUNTER — Other Ambulatory Visit (HOSPITAL_COMMUNITY): Payer: Self-pay

## 2023-04-18 ENCOUNTER — Ambulatory Visit (HOSPITAL_COMMUNITY)
Admission: RE | Admit: 2023-04-18 | Discharge: 2023-04-18 | Disposition: A | Discharge status: Home / Self Care | Payer: Medicaid Other | Source: Ambulatory Visit | Attending: Family Medicine | Admitting: Family Medicine

## 2023-04-18 ENCOUNTER — Telehealth (HOSPITAL_COMMUNITY): Payer: Self-pay

## 2023-04-18 ENCOUNTER — Encounter (HOSPITAL_COMMUNITY): Payer: Self-pay

## 2023-04-18 VITALS — BP 118/78 | HR 88 | Wt 215.4 lb

## 2023-04-18 DIAGNOSIS — E611 Iron deficiency: Secondary | ICD-10-CM

## 2023-04-18 DIAGNOSIS — I513 Intracardiac thrombosis, not elsewhere classified: Secondary | ICD-10-CM

## 2023-04-18 DIAGNOSIS — Z7901 Long term (current) use of anticoagulants: Secondary | ICD-10-CM | POA: Insufficient documentation

## 2023-04-18 DIAGNOSIS — I5020 Unspecified systolic (congestive) heart failure: Secondary | ICD-10-CM | POA: Diagnosis present

## 2023-04-18 DIAGNOSIS — I4729 Other ventricular tachycardia: Secondary | ICD-10-CM | POA: Diagnosis not present

## 2023-04-18 DIAGNOSIS — I5022 Chronic systolic (congestive) heart failure: Secondary | ICD-10-CM

## 2023-04-18 DIAGNOSIS — I361 Nonrheumatic tricuspid (valve) insufficiency: Secondary | ICD-10-CM | POA: Diagnosis not present

## 2023-04-18 DIAGNOSIS — I340 Nonrheumatic mitral (valve) insufficiency: Secondary | ICD-10-CM

## 2023-04-18 DIAGNOSIS — I071 Rheumatic tricuspid insufficiency: Secondary | ICD-10-CM

## 2023-04-18 DIAGNOSIS — Z86711 Personal history of pulmonary embolism: Secondary | ICD-10-CM | POA: Insufficient documentation

## 2023-04-18 LAB — BASIC METABOLIC PANEL
Anion gap: 16 — ABNORMAL HIGH (ref 5–15)
BUN: 9 mg/dL (ref 6–20)
CO2: 27 mmol/L (ref 22–32)
Calcium: 9.4 mg/dL (ref 8.9–10.3)
Chloride: 100 mmol/L (ref 98–111)
Creatinine, Ser: 0.87 mg/dL (ref 0.44–1.00)
GFR, Estimated: >60 mL/min (ref >60)
Glucose, Bld: 97 mg/dL (ref 70–99)
Potassium: 3.7 mmol/L (ref 3.5–5.1)
Sodium: 143 mmol/L (ref 135–145)

## 2023-04-18 LAB — IRON AND TIBC
Iron: 50 ug/dL (ref 28–170)
Saturation Ratios: 13 % (ref 10.4–31.8)
TIBC: 395 ug/dL (ref 250–450)
UIBC: 345 ug/dL

## 2023-04-18 LAB — CBC
HCT: 44.5 % (ref 36.0–46.0)
Hemoglobin: 14.3 g/dL (ref 12.0–15.0)
MCH: 28.3 pg (ref 26.0–34.0)
MCHC: 32.1 g/dL (ref 30.0–36.0)
MCV: 88.1 fL (ref 80.0–100.0)
Platelets: 330 10*3/uL (ref 150–400)
RBC: 5.05 MIL/uL (ref 3.87–5.11)
RDW: 13.8 % (ref 11.5–15.5)
WBC: 12.3 10*3/uL — ABNORMAL HIGH (ref 4.0–10.5)
nRBC: 0 % (ref 0.0–0.2)

## 2023-04-18 LAB — BRAIN NATRIURETIC PEPTIDE: B Natriuretic Peptide: 41.7 pg/mL (ref 0.0–100.0)

## 2023-04-18 LAB — FERRITIN: Ferritin: 30 ng/mL (ref 11–307)

## 2023-04-18 NOTE — Telephone Encounter (Signed)
Advanced Heart Failure Patient Advocate Encounter  Prior authorization for Marcelline Deist has been submitted and approved. Test billing returns $4 for 90 day supply.  Key: [id number] Effective: 04/18/2023 to 04/17/2024  Spoke to pharmacy to reprocess claim, confirmed $4 copay, left voicemail for patient.  Burnell Blanks, CPhT Rx Patient Advocate Phone: [phone]

## 2023-04-18 NOTE — Progress Notes (Signed)
Given farxiga samples 2 boxes. Pt educated on drug administration verbalized understanding of information given  Lot # [id number] Exp date 10/09/2025

## 2023-04-18 NOTE — Patient Instructions (Addendum)
Thank you for coming in today  If you had labs drawn today, any labs that are abnormal the clinic will call you No news is good news  Medications: You have been given samples for Farxiga your pre approval will be sent in by our Pharmacy team.  No changes   Follow up appointments:  Your physician recommends that you schedule a follow-up appointment in:  6 months With Dr. Gala Romney    Do the following things EVERYDAY: Weigh yourself in the morning before breakfast. Write it down and keep it in a log. Take your medicines as prescribed Eat low salt foods--Limit salt (sodium) to 2000 mg per day.  Stay as active as you can everyday Limit all fluids for the day to less than 2 liters   At the Advanced Heart Failure Clinic, you and your health needs are our priority. As part of our continuing mission to provide you with exceptional heart care, we have created designated Provider Care Teams. These Care Teams include your primary Cardiologist (physician) and Advanced Practice Providers (APPs- Physician Assistants and Nurse Practitioners) who all work together to provide you with the care you need, when you need it.   You may see any of the following providers on your designated Care Team at your next follow up: Dr Arvilla Meres Dr Marca Ancona Dr. Marcos Eke, NP Robbie Lis, [place] [facility] [place], [place] Brynda Peon, NP Karle Plumber, PharmD   Please be sure to bring in all your medications bottles to every appointment.    Thank you for choosing [facility]  If you have any questions or concerns before your next appointment please send [place] a message through [place] or call our office at [phone].    TO LEAVE A MESSAGE FOR THE NURSE SELECT OPTION 2, PLEASE LEAVE A MESSAGE INCLUDING: YOUR NAME DATE OF BIRTH CALL BACK NUMBER REASON FOR CALL**this is important as we prioritize the call backs  YOU WILL  RECEIVE A CALL BACK THE SAME DAY AS LONG AS YOU CALL BEFORE 4:00 PM

## 2023-05-16 ENCOUNTER — Other Ambulatory Visit (HOSPITAL_COMMUNITY)
Admission: RE | Admit: 2023-05-16 | Discharge: 2023-05-16 | Disposition: A | Discharge status: Home / Self Care | Payer: Medicaid Other | Source: Ambulatory Visit | Attending: Adult Health | Admitting: Adult Health

## 2023-05-16 ENCOUNTER — Encounter: Payer: Self-pay | Admitting: Adult Health

## 2023-05-16 ENCOUNTER — Ambulatory Visit: Payer: Medicaid Other | Admitting: Adult Health

## 2023-05-16 VITALS — BP 88/59 | HR 72 | Ht 65.0 in | Wt 214.0 lb

## 2023-05-16 DIAGNOSIS — N921 Excessive and frequent menstruation with irregular cycle: Secondary | ICD-10-CM | POA: Diagnosis not present

## 2023-05-16 DIAGNOSIS — Z8679 Personal history of other diseases of the circulatory system: Secondary | ICD-10-CM | POA: Diagnosis not present

## 2023-05-16 DIAGNOSIS — Z86711 Personal history of pulmonary embolism: Secondary | ICD-10-CM | POA: Diagnosis not present

## 2023-05-16 DIAGNOSIS — Z01419 Encounter for gynecological examination (general) (routine) without abnormal findings: Secondary | ICD-10-CM | POA: Insufficient documentation

## 2023-05-16 DIAGNOSIS — Z1331 Encounter for screening for depression: Secondary | ICD-10-CM

## 2023-05-16 DIAGNOSIS — Z95810 Presence of automatic (implantable) cardiac defibrillator: Secondary | ICD-10-CM

## 2023-05-16 NOTE — Progress Notes (Signed)
Patient ID: Debra Daniel, female   DOB: July 26, 1986, 37 y.o.   MRN: [id number] History of Present Illness: Debra Daniel is a 37 year old white female,single, G0P0, in for a well woman gyn exam and pap. She is on Micronor for period control and is doing well. She has history of PE and CHF and had ICD implant placed 12/18/21.  PCP is [facility]   Current Medications, Allergies, Past Medical History, Past Surgical History, Family History and Social History were reviewed in [name] record.     Review of Systems:  Patient denies any headaches, hearing loss, fatigue, blurred vision, shortness of breath, chest pain, abdominal pain, problems with bowel movements, urination, or intercourse(has never had sex). No joint pain or mood swings.  Periods good on Micronor.  Physical Exam:BP (!) 88/59 (BP Location: Left Arm, Patient Position: Sitting, Cuff Size: Normal)   Pulse 72   Ht 5\' 5"  (1.651 m)   Wt 214 lb (97.1 kg)   LMP 04/30/2023 (Exact Date)   BMI 35.61 kg/m   General:  Well developed, well nourished, no acute distress Skin:  Warm and dry Neck:  Midline trachea, normal thyroid, good ROM, no lymphadenopathy Lungs; Clear to auscultation bilaterally Breast:  No dominant palpable mass, retraction, or nipple discharge, has ICD about left breast  Cardiovascular: Regular rate and rhythm Abdomen:  Soft, non tender, no hepatosplenomegaly Pelvic:  External genitalia is normal in appearance, no lesions.  The vagina is normal in appearance. Urethra has no lesions or masses. The cervix is smooth, pap with HR HPV genotyping performed.  Uterus is felt to be normal size, shape, and contour.  No adnexal masses or tenderness noted.Bladder is non tender, no masses felt. Extremities/musculoskeletal:  No swelling or varicosities noted, no clubbing or cyanosis Psych:  No mood changes, alert and cooperative,seems happy AA is 0 Fall risk is moderate    05/16/2023   10:27 AM 07/25/2020    1:36 PM  05/31/2019    3:34 PM  Depression screen PHQ 2/9  Decreased Interest 0 0 0  Down, Depressed, Hopeless 0 0 0  PHQ - 2 Score 0 0 0  Altered sleeping 0    Tired, decreased energy 0    Change in appetite 0    Feeling bad or failure about yourself  0    Trouble concentrating 0    Moving slowly or fidgety/restless 0    Suicidal thoughts 0    PHQ-9 Score 0         05/16/2023   10:27 AM  GAD 7 : Generalized Anxiety Score  Nervous, Anxious, on Edge 0  Control/stop worrying 0  Worry too much - different things 0  Trouble relaxing 0  Restless 0  Easily annoyed or irritable 0  Afraid - awful might happen 0  Total GAD 7 Score 0      Upstream - 05/16/23 1038       Pregnancy Intention Screening   Does the patient want to become pregnant in the next year? No    Would the patient like to discuss contraceptive options today? No      Contraception Wrap Up   Current Method Abstinence;Oral Contraceptive    End Method Abstinence;Oral Contraceptive    Contraception Counseling Provided No            Examination chaperoned by Freddie Apley RN  Impression and Plan: 1. Encounter for gynecological examination with Papanicolaou smear of cervix Pap sent  Pap in 3 years if normal  Physical in 1 year Labs with PCP  2. Menorrhagia with irregular cycle(on chronic anti coagulation) Periods controlled on Micronor, has refills   3. 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 History of CHF (congestive heart failure)  5. ICD (implantable cardioverter-defibrillator) in place Placed 12/18/21

## 2023-05-16 NOTE — Addendum Note (Signed)
Addended by: Freddie Apley R on: 05/16/2023 11:46 AM   Modules accepted: Orders

## 2023-05-21 ENCOUNTER — Encounter: Payer: Self-pay | Admitting: Adult Health

## 2023-05-21 DIAGNOSIS — R87610 Atypical squamous cells of undetermined significance on cytologic smear of cervix (ASC-US): Secondary | ICD-10-CM | POA: Insufficient documentation

## 2023-05-21 LAB — CYTOLOGY - PAP
Adequacy: ABSENT
Comment: NEGATIVE
Diagnosis: UNDETERMINED — AB
High risk HPV: NEGATIVE

## 2023-06-18 ENCOUNTER — Ambulatory Visit (INDEPENDENT_AMBULATORY_CARE_PROVIDER_SITE_OTHER): Payer: Medicaid Other

## 2023-06-18 DIAGNOSIS — I4729 Other ventricular tachycardia: Secondary | ICD-10-CM

## 2023-06-18 LAB — CUP PACEART REMOTE DEVICE CHECK
Battery Remaining Longevity: 168 mo
Battery Remaining Percentage: 100 %
Brady Statistic RV Percent Paced: 0 %
Date Time Interrogation Session: 20241106001100
HighPow Impedance: 88 Ohm
Implantable Lead Connection Status: 753985
Implantable Lead Implant Date: 20230509
Implantable Lead Location: 753860
Implantable Lead Model: 137
Implantable Lead Serial Number: 301159
Implantable Pulse Generator Implant Date: 20230509
Lead Channel Impedance Value: 673 Ohm
Lead Channel Pacing Threshold Amplitude: 0.8 V
Lead Channel Pacing Threshold Pulse Width: 0.4 ms
Lead Channel Setting Pacing Amplitude: 2.5 V
Lead Channel Setting Pacing Pulse Width: 0.4 ms
Lead Channel Setting Sensing Sensitivity: 0.5 mV
Pulse Gen Serial Number: 216786
Zone Setting Status: 755011

## 2023-06-21 ENCOUNTER — Other Ambulatory Visit (HOSPITAL_COMMUNITY): Payer: Self-pay | Admitting: Internal Medicine

## 2023-07-03 ENCOUNTER — Encounter: Payer: Self-pay | Admitting: Internal Medicine

## 2023-07-04 ENCOUNTER — Other Ambulatory Visit (HOSPITAL_COMMUNITY): Payer: Self-pay | Admitting: Internal Medicine

## 2023-07-08 NOTE — Progress Notes (Signed)
Remote ICD transmission.   

## 2023-09-17 ENCOUNTER — Encounter: Payer: Self-pay | Admitting: Internal Medicine

## 2023-09-17 ENCOUNTER — Ambulatory Visit: Payer: Medicaid Other

## 2023-09-17 DIAGNOSIS — I4729 Other ventricular tachycardia: Secondary | ICD-10-CM

## 2023-09-17 LAB — CUP PACEART REMOTE DEVICE CHECK
Battery Remaining Longevity: 162 mo
Battery Remaining Percentage: 100 %
Brady Statistic RV Percent Paced: 0 %
Date Time Interrogation Session: 20250205001300
HighPow Impedance: 84 Ohm
Implantable Lead Connection Status: 753985
Implantable Lead Implant Date: 20230509
Implantable Lead Location: 753860
Implantable Lead Model: 137
Implantable Lead Serial Number: 301159
Implantable Pulse Generator Implant Date: 20230509
Lead Channel Impedance Value: 645 Ohm
Lead Channel Pacing Threshold Amplitude: 0.7 V
Lead Channel Pacing Threshold Pulse Width: 0.4 ms
Lead Channel Setting Pacing Amplitude: 2.5 V
Lead Channel Setting Pacing Pulse Width: 0.4 ms
Lead Channel Setting Sensing Sensitivity: 0.5 mV
Pulse Gen Serial Number: 216786
Zone Setting Status: 755011

## 2023-10-03 ENCOUNTER — Other Ambulatory Visit (HOSPITAL_COMMUNITY): Payer: Self-pay | Admitting: Internal Medicine

## 2023-10-11 ENCOUNTER — Other Ambulatory Visit (HOSPITAL_COMMUNITY): Payer: Self-pay | Admitting: Internal Medicine

## 2023-10-23 ENCOUNTER — Encounter (HOSPITAL_COMMUNITY): Payer: Self-pay | Admitting: Cardiology

## 2023-10-23 ENCOUNTER — Ambulatory Visit (HOSPITAL_COMMUNITY)
Admission: RE | Admit: 2023-10-23 | Discharge: 2023-10-23 | Disposition: A | Discharge status: Home / Self Care | Payer: Medicaid Other | Source: Ambulatory Visit | Attending: Cardiology | Admitting: Cardiology

## 2023-10-23 VITALS — BP 90/60 | HR 84 | Wt 210.4 lb

## 2023-10-23 DIAGNOSIS — Z86718 Personal history of other venous thrombosis and embolism: Secondary | ICD-10-CM | POA: Insufficient documentation

## 2023-10-23 DIAGNOSIS — I493 Ventricular premature depolarization: Secondary | ICD-10-CM | POA: Diagnosis not present

## 2023-10-23 DIAGNOSIS — I472 Ventricular tachycardia, unspecified: Secondary | ICD-10-CM | POA: Diagnosis not present

## 2023-10-23 DIAGNOSIS — I081 Rheumatic disorders of both mitral and tricuspid valves: Secondary | ICD-10-CM | POA: Insufficient documentation

## 2023-10-23 DIAGNOSIS — I5022 Chronic systolic (congestive) heart failure: Secondary | ICD-10-CM | POA: Insufficient documentation

## 2023-10-23 DIAGNOSIS — J182 Hypostatic pneumonia, unspecified organism: Secondary | ICD-10-CM | POA: Diagnosis not present

## 2023-10-23 DIAGNOSIS — Z79899 Other long term (current) drug therapy: Secondary | ICD-10-CM | POA: Diagnosis not present

## 2023-10-23 DIAGNOSIS — Z86711 Personal history of pulmonary embolism: Secondary | ICD-10-CM | POA: Diagnosis not present

## 2023-10-23 DIAGNOSIS — Z95810 Presence of automatic (implantable) cardiac defibrillator: Secondary | ICD-10-CM | POA: Diagnosis not present

## 2023-10-23 MED ORDER — SPIRONOLACTONE 25 MG PO TABS: 25.0000 mg | ORAL_TABLET | Freq: Every day | ORAL | 3 refills | Status: AC

## 2023-10-23 NOTE — Progress Notes (Signed)
 ADVANCED HEART FAILURE FOLLOW UP CLINIC NOTE  Referring Physician: [facility]*  Primary Care: [facility] Primary Cardiologist:  HPI: Debra Daniel is a 38 y.o. female who presents for follow up of chronic systolic heart failure.      Presented to the ED in [facility] 02/05/21 w/ hemoptysis.  CTA chest/abdomen/pelvis with acute RLL segmental and subsegmental PE, consolidation in bilateral lower lobes, diffuse anasarca. OCPs were stopped. Echo showed EF of 25-30%, moderate to severe MR, moderate TR. She was transferred to [facility] for additional pulmonary and cardiac evaluation.   Echo at [facility] LVEF of 10-15% with mod dilated LV, severe secondary MR, moderate to severe RV dysfunction with moderately enlarged RV, severe TR. She developed cardiogenic shock and was placed on milrinone, and briefly on low-dose NE. L/RHC (02/13/21): no CAD. She developed PMVT requiring shock x 2 and brief CPR. QTc long, amio stopped. Sustained another episode of PMVT, ivabradine stopped.      SUBJECTIVE:  Patient reports no complaints today.  She is doing well, has been compliant with her medications, denies any lower extremity swelling, palpitations, ICD firing, chest pain, shortness of breath.  She does not work but has been active around the house and walks a few miles every day.  She feels well.  PMH, current medications, allergies, social history, and family history reviewed in epic.  PHYSICAL EXAM: Vitals:   10/23/23 1013  BP: 90/60  Pulse: 84  SpO2: 99%   GENERAL: Well nourished and in no apparent distress at rest.  PULM:  Normal work of breathing, clear to auscultation bilaterally. Respirations are unlabored.  CARDIAC:  JVP: Flat         Normal rate with regular rhythm. No murmurs, rubs or gallops.  Trace edema. Warm and well perfused extremities. ABDOMEN: Soft, non-tender, non-distended. NEUROLOGIC: Patient is oriented x3 with no focal or lateralizing neurologic  deficits.    DATA REVIEW  ECG: Normal sinus rhythm with occasional preventricular contractions  ECHO: - Echo (3/24): EF 20-25%, moderate LVH, RV moderately reduced   - Echo (10/22/21): EF 20% RV moderately HK. G1DD. Trivial MR    - Echo (07/16/21): EF 20-25% RV normal. Mild to mod MR. Mild TR    - Echo (6/22): EF 10-15%, RV moderately enlarged with severely reduced systolic function, RVSP 45.9 mmHg, severe secondary MR, mod-severe TR.  CATH: - R/LHC (7/22): No CAD, with CI 2.48 on milrinone 0.25. Mildly elevated filling pressures, pulmonary venous hypertension. RA mean 9 RV 52/17 PA 53/17, mean 31 PCWP mean 16 LV 90/14 AO 90/63   cMRI (7/22): LVEF 18%, diffuse hypokinesis, LV thrombus, mod dilated RV with EF 15%, mild MR, subtle non-coronary mid-wall LGE pattern in the inferior and anterolateral walls, possible prior myocarditis.   Heart failure review: - Classification: Heart failure with reduced EF - Etiology: Myocarditis versus genetic - NYHA Class: II - Volume status: Euvolemic - ACEi/ARB/ARNI: Currently up-titrating - Aldosterone antagonist: Currently up-titrating - Beta-blocker: Currently up-titrating - Digoxin: Not indicated - Hydralazine/Nitrates: Not indicated - SGLT2i: Maximally tolerated dose - GLP-1: Consider in future - Advanced therapies: Not needed at this time - ICD: Already in place  ASSESSMENT & PLAN:  Chronic systolic heart failure: Onset June 2022, viral myocarditis versus familial cardiomyopathy.  Has declined genetic testing in the past.  Last echo 10/2022 with EF 20 to 25%, moderately reduced RV.  Stable NYHA class I-II symptoms.  Compensated and euvolemic. -Increase spironolactone to 25 mg daily -Continue torsemide  20 mg daily -Repeat labs -Continue metoprolol 50 mg daily, Farxiga 10 mg daily, losartan 50 mg nightly  NSVT: Off amiodarone and ivabradine with prolonged Qtc and PMVT. - BS ICD in place  LV thrombus:  - Resolved on last  echo  DVT/PE:  - Continue echocardiogram Follow up in 6 months with Dr. Lewayne Bunting, MD Advanced Heart Failure Mechanical Circulatory Support 10/23/23

## 2023-10-23 NOTE — Patient Instructions (Signed)
 INCREASE Spironolactone to 25 mg daily.  Your physician recommends that you schedule a follow-up appointment in: 6 months (September) ** PLEASE CALL THE OFFICE IN [place] TO ARRANGE YOUR FOLLOW UP APPOINTMENT.**  If you have any questions or concerns before your next appointment please send [place] a message through [place] or call our office at [phone].    TO LEAVE A MESSAGE FOR THE NURSE SELECT OPTION 2, PLEASE LEAVE A MESSAGE INCLUDING: YOUR NAME DATE OF BIRTH CALL BACK NUMBER REASON FOR CALL**this is important as we prioritize the call backs  YOU WILL RECEIVE A CALL BACK THE SAME DAY AS LONG AS YOU CALL BEFORE 4:00 PM  At the Advanced [facility] Failure Clinic, you and your health needs are our priority. As part of our continuing mission to provide you with exceptional heart care, we have created designated Provider Care Teams. These Care Teams include your primary Cardiologist (physician) and Advanced Practice Providers (APPs- Physician Assistants and Nurse Practitioners) who all work together to provide you with the care you need, when you need it.   You may see any of the following providers on your designated Care Team at your next follow up: Dr Arvilla Meres Dr Marca Ancona Dr. Dorthula Nettles Dr. Clearnce Hasten Amy Filbert Schilder, NP Robbie Lis, [place] [facility], [place] Brynda Peon, NP [place] Lee, NP Clarisa Kindred, NP Karle Plumber, PharmD Enos Fling, PharmD   Please be sure to bring in all your medications bottles to every appointment.    Thank you for choosing [facility]

## 2023-10-24 NOTE — Progress Notes (Signed)
 Remote ICD transmission.

## 2023-11-10 ENCOUNTER — Other Ambulatory Visit: Payer: Self-pay | Admitting: Adult Health

## 2023-11-10 MED ORDER — FLUCONAZOLE 150 MG PO TABS: ORAL_TABLET | ORAL | 3 refills | Status: AC

## 2023-11-10 NOTE — Progress Notes (Signed)
 Refilled diflucan

## 2023-12-17 ENCOUNTER — Encounter: Payer: Self-pay | Admitting: Internal Medicine

## 2023-12-17 ENCOUNTER — Ambulatory Visit (INDEPENDENT_AMBULATORY_CARE_PROVIDER_SITE_OTHER): Payer: Medicaid Other

## 2023-12-17 DIAGNOSIS — I4729 Other ventricular tachycardia: Secondary | ICD-10-CM

## 2023-12-17 LAB — CUP PACEART REMOTE DEVICE CHECK
Battery Remaining Longevity: 162 mo
Battery Remaining Percentage: 100 %
Brady Statistic RV Percent Paced: 0 %
Date Time Interrogation Session: 20250507001100
HighPow Impedance: 87 Ohm
Implantable Lead Connection Status: 753985
Implantable Lead Implant Date: 20230509
Implantable Lead Location: 753860
Implantable Lead Model: 137
Implantable Lead Serial Number: 301159
Implantable Pulse Generator Implant Date: 20230509
Lead Channel Impedance Value: 671 Ohm
Lead Channel Pacing Threshold Amplitude: 0.7 V
Lead Channel Pacing Threshold Pulse Width: 0.4 ms
Lead Channel Setting Pacing Amplitude: 2.5 V
Lead Channel Setting Pacing Pulse Width: 0.4 ms
Lead Channel Setting Sensing Sensitivity: 0.5 mV
Pulse Gen Serial Number: 216786
Zone Setting Status: 755011

## 2023-12-26 ENCOUNTER — Other Ambulatory Visit (HOSPITAL_COMMUNITY): Payer: Self-pay | Admitting: Internal Medicine

## 2023-12-28 ENCOUNTER — Other Ambulatory Visit (HOSPITAL_COMMUNITY): Payer: Self-pay | Admitting: Internal Medicine

## 2024-01-27 NOTE — Progress Notes (Signed)
 Remote ICD transmission.

## 2024-03-17 ENCOUNTER — Ambulatory Visit (INDEPENDENT_AMBULATORY_CARE_PROVIDER_SITE_OTHER): Payer: Medicaid Other

## 2024-03-17 DIAGNOSIS — I4729 Other ventricular tachycardia: Secondary | ICD-10-CM | POA: Diagnosis not present

## 2024-03-18 ENCOUNTER — Ambulatory Visit: Payer: Self-pay | Admitting: Internal Medicine

## 2024-03-18 LAB — CUP PACEART REMOTE DEVICE CHECK
Battery Remaining Longevity: 162 mo
Battery Remaining Percentage: 100 %
Brady Statistic RV Percent Paced: 0 %
Date Time Interrogation Session: 20250806001100
HighPow Impedance: 94 Ohm
Implantable Lead Connection Status: 753985
Implantable Lead Implant Date: 20230509
Implantable Lead Location: 753860
Implantable Lead Model: 137
Implantable Lead Serial Number: 301159
Implantable Pulse Generator Implant Date: 20230509
Lead Channel Impedance Value: 688 Ohm
Lead Channel Pacing Threshold Amplitude: 0.9 V
Lead Channel Pacing Threshold Pulse Width: 0.4 ms
Lead Channel Setting Pacing Amplitude: 2.5 V
Lead Channel Setting Pacing Pulse Width: 0.4 ms
Lead Channel Setting Sensing Sensitivity: 0.5 mV
Pulse Gen Serial Number: 216786
Zone Setting Status: 755011

## 2024-03-28 ENCOUNTER — Other Ambulatory Visit (HOSPITAL_COMMUNITY): Payer: Self-pay | Admitting: Internal Medicine

## 2024-03-29 ENCOUNTER — Other Ambulatory Visit (HOSPITAL_COMMUNITY): Payer: Self-pay | Admitting: Cardiology

## 2024-04-05 ENCOUNTER — Other Ambulatory Visit: Payer: Self-pay | Admitting: Obstetrics & Gynecology

## 2024-04-06 ENCOUNTER — Encounter: Payer: Self-pay | Admitting: Obstetrics & Gynecology

## 2024-04-18 NOTE — Progress Notes (Unsigned)
 Cardiology Office Note:  .   Date:  04/18/2024  ID:  Debra Daniel, DOB February 28, 1986, MRN [id number] PCP: [facility]  [facility] Providers Cardiologist:  None {  History of Present Illness: .   Debra Daniel is a 38 y.o. female w/PMHx of  NICM LV thrombus, PE (2022 >> OBC stopped)  2022 admitted with hemoptysis -- RLL segmental and subsegmental PE, consolidation in bilateral lower lobes, diffuse anasarca.  -- OCPs were stopped.  -- Echo showed EF of 25-30%, moderate to severe MR, moderate TR. She was transferred to [facility] for additional pulmonary and cardiac evaluation.  -- Echo at [facility] LVEF of 10-15% with mod dilated LV, severe secondary MR, moderate to severe RV dysfunction with moderately enlarged RV, severe TR.  -- developed cardiogenic shock and was placed on milrinone , and briefly on low-dose NE.  -- L/RHC (02/13/21): no CAD.  -- developed PMVT requiring shock x 2 and brief CPR. QTc long, amio stopped.  -- Sustained another episode of PMVT, ivabradine  stopped.   She saw Dr. Waddell 04/15/23, mentions NSVT, no changes were made  Following w/AHF team, Dr. 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 Left ventricular ejection fraction, by estimation, is 20 to 25%. The  left ventricle  has severely decreased function. The left ventricle  demonstrates global hypokinesis. There is moderate concentric left  ventricular hypertrophy. Indeterminate  diastolic filling due to E-A fusion. No LV thrombus on constrast imaging.   2. Right ventricular systolic function is moderately reduced. The right  ventricular size is normal. There is normal pulmonary artery systolic  pressure.   3. The mitral valve is grossly normal. No evidence of mitral valve  regurgitation. No evidence of mitral stenosis.   4. The aortic valve was not well visualized. Aortic valve regurgitation  is not visualized. No aortic stenosis is present.   Comparison(s): No significant change from prior study.    cMRI (7/22): LVEF 18%, diffuse hypokinesis, LV thrombus, mod dilated RV with EF 15%, mild MR, subtle non-coronary mid-wall LGE pattern in the inferior and anterolateral walls, possible prior myocarditis.    Risk Assessment/Calculations:    Physical Exam:   VS:  There were no vitals taken for this visit.   Wt Readings from Last 3 Encounters:  10/23/23 210 lb 6.4 oz (95.4 kg)  05/16/23 214 lb (97.1 kg)  04/18/23 215 lb 6.4 oz (97.7 kg)    GEN: Well nourished, well developed in no acute distress NECK: No JVD; No carotid bruits CARDIAC: RRR, no murmurs, rubs, gallops RESPIRATORY:  CTA b/l without rales, wheezing or rhonchi  ABDOMEN: Soft, non-tender, non-distended EXTREMITIES: No edema; No deformity   ICD site: is stable, no thinning, fluctuation, tethering  ASSESSMENT AND PLAN: .    ICD intact function  no programming changes made  NICM Chronic CHF Hx of c.arrest (discussed above) LV thrumbus Hx of PE On eliquis , appropriately dosed Heart logic score is 5/stable C/w Dr. Criss team    Dispo: remotes as usual, in clinic in 1 year, sooner if needed  Signed, Charlies Macario Arthur, PA-C

## 2024-04-19 ENCOUNTER — Ambulatory Visit: Attending: Physician Assistant | Admitting: Physician Assistant

## 2024-04-19 ENCOUNTER — Encounter: Payer: Self-pay | Admitting: Physician Assistant

## 2024-04-19 VITALS — BP 94/62 | HR 87 | Ht 65.0 in | Wt 215.2 lb

## 2024-04-19 DIAGNOSIS — Z95810 Presence of automatic (implantable) cardiac defibrillator: Secondary | ICD-10-CM | POA: Insufficient documentation

## 2024-04-19 DIAGNOSIS — I428 Other cardiomyopathies: Secondary | ICD-10-CM | POA: Insufficient documentation

## 2024-04-19 DIAGNOSIS — I472 Ventricular tachycardia, unspecified: Secondary | ICD-10-CM | POA: Diagnosis not present

## 2024-04-19 LAB — CUP PACEART INCLINIC DEVICE CHECK
Date Time Interrogation Session: 20250908190952
HighPow Impedance: 92 Ohm
Implantable Lead Connection Status: 753985
Implantable Lead Implant Date: 20230509
Implantable Lead Location: 753860
Implantable Lead Model: 137
Implantable Lead Serial Number: 301159
Implantable Pulse Generator Implant Date: 20230509
Lead Channel Impedance Value: 761 Ohm
Lead Channel Pacing Threshold Amplitude: 0.9 V
Lead Channel Pacing Threshold Pulse Width: 0.4 ms
Lead Channel Sensing Intrinsic Amplitude: 15.9 mV
Lead Channel Setting Pacing Amplitude: 2.5 V
Lead Channel Setting Pacing Pulse Width: 0.4 ms
Lead Channel Setting Sensing Sensitivity: 0.5 mV
Pulse Gen Serial Number: 216786
Zone Setting Status: 755011

## 2024-04-19 NOTE — Patient Instructions (Signed)
 Medication Instructions:   Your physician recommends that you continue on your current medications as directed. Please refer to the Current Medication list given to you today.   *If you need a refill on your cardiac medications before your next appointment, please call your pharmacy*    Lab Work: NONE ORDERED  TODAY    If you have labs (blood work) drawn today and your tests are completely normal, you will receive your results only by: MyChart Message (if you have MyChart) OR A paper copy in the mail If you have any lab test that is abnormal or we need to change your treatment, we will call you to review the results.    Testing/Procedures: NONE ORDERED  TODAY   Follow-Up:  At [facility], you and your health needs are our priority.  As part of our continuing mission to provide you with exceptional heart care, our providers are all part of one team.  This team includes your primary Cardiologist (physician) and Advanced Practice Providers or APPs (Physician Assistants and Nurse Practitioners) who all work together to provide you with the care you need, when you need it.   Your next appointment:  1 year(s)  Provider:   Charlies Arthur, PA-C    We recommend signing up for the patient portal called MyChart.  Sign up information is provided on this After Visit Summary.  MyChart is used to connect with patients for Virtual Visits (Telemedicine).  Patients are able to view lab/test results, encounter notes, upcoming appointments, etc.  Non-urgent messages can be sent to your provider as well.   To learn more about what you can do with MyChart, go to [email or web address].   Other Instructions

## 2024-04-27 NOTE — Progress Notes (Signed)
 Advanced Heart Failure Clinic Note    PCP: [facility]. HF Cardiologist: Dr. Cherrie  HPI: Debra Daniel is a 38 y.o. with systolic heart failure due to NICM, DM2 hypothyroidism, LV thrombus & PE on Eliquis .    Presented to the ED in [facility] 02/05/21 w/ hemoptysis.  CTA chest/abdomen/pelvis with acute RLL segmental and subsegmental PE, consolidation in bilateral lower lobes, diffuse anasarca. OCPs were stopped. Echo showed EF of 25-30%, moderate to severe MR, moderate TR. She was transferred to [facility] for additional pulmonary and cardiac evaluation.   Echo at [facility] LVEF of 10-15% with mod dilated LV, severe secondary MR, moderate to severe RV dysfunction with moderately enlarged RV, severe TR. She developed cardiogenic shock and was placed on milrinone , and briefly on low-dose NE. L/RHC (02/13/21): no CAD. She developed PMVT requiring shock x 2 and brief CPR. QTc long, amio stopped. Sustained another episode of PMVT, ivabradine  stopped.   ICD placed by [facility] 12/18/21. No problems has healed well   Follow up 9/23, NYHA II, Toprol  increased to 50 mg qhs. Decided against genetic testing  Echo 3/24 EF 20-25%, moderate LVH, RV moderately reduced  Today she returns for HF follow up with her mother. Overall feeling fine. She is not SOB with activity. Denies palpitations, abnormal bleeding, CP, dizziness, edema, or PND/Orthopnea. Appetite ok. No fever or chills. Weight at home 212 lbs. Taking all medications. Contracepting with OCPs.   Cardiac Studies - Echo (3/24): EF 20-25%, moderate LVH, RV moderately reduced  - Echo (10/22/21): EF 20% RV moderately HK. G1DD. Trivial MR    - Echo (07/16/21): EF 20-25% RV normal. Mild to mod MR. Mild TR   - Echo (6/22): EF 10-15%, RV moderately enlarged with severely reduced systolic function, RVSP 45.9 mmHg, severe secondary MR, mod-severe TR.  - R/LHC (7/22): No CAD, with CI 2.48 on milrinone  0.25. Mildly elevated filling pressures, pulmonary venous  hypertension. RA mean 9 RV 52/17 PA 53/17, mean 31 PCWP mean 16 LV 90/14 AO 90/63  Oxygen saturations: PA 57% AO 98%  Cardiac Output (Fick) 5.1  Cardiac Index (Fick) 2.48  PVR 2.9 WU  - cMRI (7/22): LVEF 18%, diffuse hypokinesis, LV thrombus, mod dilated RV with EF 15%, mild MR, subtle non-coronary mid-wall LGE pattern in the inferior and anterolateral walls, possible prior myocarditis.  Past Medical History:  Diagnosis Date   Encounter for menstrual regulation 01/24/2015   Hypothyroid    Migraines    Obesity    UTI (urinary tract infection)    Current Outpatient Medications  Medication Sig Dispense Refill   acetaminophen  (TYLENOL ) 500 MG tablet Take 1,000 mg by mouth every 6 (six) hours as needed for mild pain.     ELIQUIS  5 MG TABS tablet TAKE 1 TABLET BY MOUTH TWICE DAILY 180 tablet 3   FARXIGA  10 MG TABS tablet TAKE 1 TABLET BY MOUTH ONCE DAILY. 90 tablet 3   fluconazole  (DIFLUCAN ) 150 MG tablet TAKE 1 TABLET before period every month 2 tablet 3   levothyroxine  (SYNTHROID ) 100 MCG tablet TAKE ONE TABLET BY MOUTH EVERY MORNING ON AN EMPTY STOMACH 90 tablet 1   losartan  (COZAAR ) 50 MG tablet TAKE ONE TABLET BY MOUTH AT BEDTIME 90 tablet 3   magnesium  oxide (MAG-OX) 400 (240 Mg) MG tablet TAKE ONE TABLET BY MOUTH EVERY DAY 45 tablet 3   metoprolol  succinate (TOPROL -XL) 50 MG 24 hr tablet TAKE ONE TABLET BY MOUTH EVERY DAY (TAKE WITH OR IMMEDIATELY FOLLOWING A MEAL) 90 tablet 3  norethindrone  (MICRONOR ) 0.35 MG tablet TAKE ONE TABLET BY MOUTH ONCE DAILY 28 tablet 11   potassium chloride  SA (KLOR-CON  M) 20 MEQ tablet TAKE TWO TABLETS BY MOUTH EVERY DAY (Patient taking differently: Take 20 mEq by mouth daily.) 180 tablet 3   spironolactone  (ALDACTONE ) 25 MG tablet Take 1 tablet (25 mg total) by mouth daily. 90 tablet 3   torsemide  (DEMADEX ) 20 MG tablet TAKE ONE TABLET BY MOUTH EVERY DAY 90 tablet 2   No current facility-administered medications for this encounter.    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   Sulfa Antibiotics Hives   Ibuprofen Other (See Comments)    Constipation Can tolerate w milk of mag   Sacubitril -Valsartan  Other (See Comments)    Lower her blood pressure to much    Social History   Socioeconomic History   Marital status: Single    Spouse name: Not on file   Number of children: Not on file   Years of education: Not on file   Highest education level: Not on file  Occupational History   Not on file  Tobacco Use   Smoking status: Never   Smokeless tobacco: Never  Vaping Use   Vaping status: Never Used  Substance and Sexual Activity   Alcohol use: No   Drug use: No   Sexual activity: Never    Birth control/protection: Abstinence, Pill  Other Topics Concern   Not on file  Social History Narrative   Not on file   Social Drivers of Health   Financial Resource Strain: Low Risk  (05/16/2023)   Overall Financial Resource Strain (CARDIA)    Difficulty of Paying Living Expenses: Not hard at all  Food Insecurity: No Food Insecurity (05/16/2023)   Hunger Vital Sign    Worried About Running Out of Food in the Last Year: Never true    Ran Out of Food in the Last Year: Never true  Transportation Needs: No Transportation Needs (05/16/2023)   PRAPARE - Administrator, Civil Service (Medical): No    Lack of Transportation (Non-Medical): No  Physical Activity: Sufficiently Active (05/16/2023)   Exercise Vital Sign    Days of Exercise per Week: 5 days    Minutes of Exercise per Session: 30 min  Stress: No Stress Concern Present (05/16/2023)   [employer] of Occupational Health - Occupational Stress Questionnaire    Feeling of Stress : Not at all  Social Connections: Moderately Isolated (05/16/2023)   Social Connection and Isolation Panel    Frequency of Communication with Friends and Family: More than three times a week    Frequency of Social  Gatherings with Friends and Family: More than three times a week    Attends Religious Services: More than 4 times per year    Active Member of [employer] or Organizations: No    Attends Banker Meetings: Never    Marital Status: Never married  Intimate Partner Violence: Not At Risk (05/16/2023)   Humiliation, Afraid, Rape, and Kick questionnaire    Fear of Current or Ex-Partner: No    Emotionally Abused: No    Physically Abused: No    Sexually Abused: No   Family History  Problem Relation Age of Onset   COPD Paternal Grandfather    Hypertension Paternal Grandmother    Coronary artery disease Paternal Grandmother    Hypertension Maternal Grandmother    Diabetes Maternal Grandmother  Hyperlipidemia Maternal Grandmother    Thyroid disease Maternal Grandmother    Kidney disease Maternal Grandmother    Heart disease Maternal Grandfather    Diabetes Maternal Grandfather    Diabetes Father    Hypertension Mother    Hyperlipidemia Mother    Hypertension Brother    Hyperlipidemia Brother    Throat cancer Maternal Uncle    BP 100/64   Pulse 86   Ht 5' 5 (1.651 m)   Wt 97.5 kg (215 lb)   LMP 03/15/2024 (Approximate)   SpO2 98%   BMI 35.78 kg/m   Wt Readings from Last 3 Encounters:  04/30/24 97.5 kg (215 lb)  04/19/24 97.6 kg (215 lb 3.2 oz)  10/23/23 95.4 kg (210 lb 6.4 oz)   PHYSICAL EXAM: General:  NAD. No resp difficulty HEENT: Normal Neck: Supple. No JVD. Cor: Regular rate & rhythm. No rubs, gallops or murmurs. Lungs: Clear Abdomen: Soft, nontender, nondistended.  Extremities: No cyanosis, clubbing, rash, edema Neuro: Alert & oriented x 3, moves all 4 extremities w/o difficulty. Affect pleasant.  Device interrogation (personally reviewed from 04/29/24): HL score 5, average HR 89 bpm, 0.9 hr/day activity, no VT  ASSESSMENT & PLAN: 1. Chronic systolic HF  - Onset 6/22 ? viral myocarditis vs familial cardiomyopathy (grandmother and aunts/uncles with CHF).    - Echo (6/22): EF of 10-15%, moderate to severely reduced RV fxn with mildly dilated RV, RVSP 45 mmHg, severe secondary MR, mod-severe TR. - cMRI (7/22): LV EF 18%, RV EF 26%, LV thrombus, Subtle non-coronary mid-wall LGE pattern in the inferior and anterolateral walls, possible prior myocarditis. - R/LHC (7/22): No CAD, with CI 2.48 on milrinone  0.25.   - Echo (12/22): EF 20-25% LV smaller Mild to mod MR. Mild TR - Echo (3/23): EF 20% Mildly dilated RV mod HK. G1DD. Triv MR - Echo (3/24): EF 20-25%, moderate LVH, RV moderately reduced - Stable NYHA I-II. Volume ok on exam and by device - Continue Toprol  XL 50 mg daily. - Continue torsemide  20 mg daily + 20 KCL daily. - Continue losartan  50 mg daily (BP did not tolerate Entresto ). - Continue spironolactone  25 mg daily. - Continue dapagliflozin  10 mg daily. No GU symptoms. - Off ivabradine  with PMVT. - Has decided against genetic testing.  - She is contracepting with OCPs and understands fetotoxicity of her GDMT. - ICD interrogated personally - Labs today - Update echo  2. Mitral valve regurgitation, functional - Severe on echo this past admission, only looked mild on cMRI however. - MR trivial on echo 3/23. - Resolved on 3/24 echo   3. Moderate-Severe TR - Trivial on last echo. - Follow on echo  4. NSVT  - She is off amiodarone  and ivabradine  w/ prolonged QTc and subsequent PMVT. - Now s/p BoSci ICD  5. LV thrombus - Remains on apixaban  5 mg bid.  - No bleeding issues. - Check CBC  6. Iron  Deficiency - On oral Fe - Check iron  panel today  Update echo. Follow up in 6 months with Dr. Bensimhon.  Harlene HERO [place], FNP 04/30/24  3:10 PM

## 2024-04-30 ENCOUNTER — Encounter (HOSPITAL_COMMUNITY): Payer: Self-pay

## 2024-04-30 ENCOUNTER — Ambulatory Visit (HOSPITAL_COMMUNITY)
Admission: RE | Admit: 2024-04-30 | Discharge: 2024-04-30 | Disposition: A | Discharge status: Home / Self Care | Source: Ambulatory Visit | Attending: Family Medicine | Admitting: Family Medicine

## 2024-04-30 VITALS — BP 100/64 | HR 86 | Ht 65.0 in | Wt 215.0 lb

## 2024-04-30 DIAGNOSIS — I428 Other cardiomyopathies: Secondary | ICD-10-CM | POA: Diagnosis present

## 2024-04-30 DIAGNOSIS — Z7901 Long term (current) use of anticoagulants: Secondary | ICD-10-CM | POA: Diagnosis not present

## 2024-04-30 DIAGNOSIS — I071 Rheumatic tricuspid insufficiency: Secondary | ICD-10-CM

## 2024-04-30 DIAGNOSIS — I340 Nonrheumatic mitral (valve) insufficiency: Secondary | ICD-10-CM | POA: Diagnosis not present

## 2024-04-30 DIAGNOSIS — Z7984 Long term (current) use of oral hypoglycemic drugs: Secondary | ICD-10-CM | POA: Diagnosis not present

## 2024-04-30 DIAGNOSIS — E611 Iron deficiency: Secondary | ICD-10-CM | POA: Insufficient documentation

## 2024-04-30 DIAGNOSIS — Z79890 Hormone replacement therapy (postmenopausal): Secondary | ICD-10-CM | POA: Diagnosis not present

## 2024-04-30 DIAGNOSIS — Z79899 Other long term (current) drug therapy: Secondary | ICD-10-CM | POA: Diagnosis not present

## 2024-04-30 DIAGNOSIS — Z793 Long term (current) use of hormonal contraceptives: Secondary | ICD-10-CM | POA: Insufficient documentation

## 2024-04-30 DIAGNOSIS — E119 Type 2 diabetes mellitus without complications: Secondary | ICD-10-CM | POA: Insufficient documentation

## 2024-04-30 DIAGNOSIS — I5022 Chronic systolic (congestive) heart failure: Secondary | ICD-10-CM | POA: Diagnosis present

## 2024-04-30 DIAGNOSIS — Z86711 Personal history of pulmonary embolism: Secondary | ICD-10-CM | POA: Insufficient documentation

## 2024-04-30 DIAGNOSIS — I4729 Other ventricular tachycardia: Secondary | ICD-10-CM | POA: Insufficient documentation

## 2024-04-30 DIAGNOSIS — E039 Hypothyroidism, unspecified: Secondary | ICD-10-CM | POA: Diagnosis not present

## 2024-04-30 DIAGNOSIS — I513 Intracardiac thrombosis, not elsewhere classified: Secondary | ICD-10-CM | POA: Insufficient documentation

## 2024-04-30 LAB — BASIC METABOLIC PANEL WITH GFR
Anion gap: 14 (ref 5–15)
BUN: 13 mg/dL (ref 6–20)
CO2: 23 mmol/L (ref 22–32)
Calcium: 9.2 mg/dL (ref 8.9–10.3)
Chloride: 102 mmol/L (ref 98–111)
Creatinine, Ser: 0.98 mg/dL (ref 0.44–1.00)
GFR, Estimated: >60 mL/min (ref >60)
Glucose, Bld: 103 mg/dL — ABNORMAL HIGH (ref 70–99)
Potassium: 3.6 mmol/L (ref 3.5–5.1)
Sodium: 139 mmol/L (ref 135–145)

## 2024-04-30 LAB — IRON AND TIBC
Iron: 50 ug/dL (ref 28–170)
Saturation Ratios: 12 % (ref 10.4–31.8)
TIBC: 416 ug/dL (ref 250–450)
UIBC: 366 ug/dL

## 2024-04-30 LAB — CBC
HCT: 44.7 % (ref 36.0–46.0)
Hemoglobin: 14.7 g/dL (ref 12.0–15.0)
MCH: 29 pg (ref 26.0–34.0)
MCHC: 32.9 g/dL (ref 30.0–36.0)
MCV: 88.2 fL (ref 80.0–100.0)
Platelets: 362 K/uL (ref 150–400)
RBC: 5.07 MIL/uL (ref 3.87–5.11)
RDW: 13 % (ref 11.5–15.5)
WBC: 13.5 K/uL — ABNORMAL HIGH (ref 4.0–10.5)
nRBC: 0 % (ref 0.0–0.2)

## 2024-04-30 LAB — FERRITIN: Ferritin: 24 ng/mL (ref 11–307)

## 2024-04-30 NOTE — Patient Instructions (Addendum)
 Good to see you today!    Your physician has requested that you have an echocardiogram. Echocardiography is a painless test that uses sound waves to create images of your heart. It provides your doctor with information about the size and shape of your heart and how well your heart's chambers and valves are working. This procedure takes approximately one hour. There are no restrictions for this procedure. Please do NOT wear cologne, perfume, aftershave, or lotions (deodorant is allowed). Please arrive 15 minutes prior to your appointment time.  Please note: We ask at that you not bring children with you during ultrasound (echo/ vascular) testing. Due to room size and safety concerns, children are not allowed in the ultrasound rooms during exams. Our front office staff cannot provide observation of children in our lobby area while testing is being conducted. An adult accompanying a patient to their appointment will only be allowed in the ultrasound room at the discretion of the ultrasound technician under special circumstances. We apologize for any inconvenience.   Labs done today, your results will be available in MyChart, we will contact you for abnormal readings.  Your physician recommends that you schedule a follow-up appointment 6 months(March) Call office in January tp schedule an appointment  If you have any questions or concerns before your next appointment please send us  a message through [place] or call our office at [phone].    TO LEAVE A MESSAGE FOR THE NURSE SELECT OPTION 2, PLEASE LEAVE A MESSAGE INCLUDING: YOUR NAME DATE OF BIRTH CALL BACK NUMBER REASON FOR CALL**this is important as we prioritize the call backs  YOU WILL RECEIVE A CALL BACK THE SAME DAY AS LONG AS YOU CALL BEFORE 4:00 PM At the [facility], you and your health needs are our priority. As part of our continuing mission to provide you with exceptional heart care, we have created designated  Provider Care Teams. These Care Teams include your primary Cardiologist (physician) and Advanced Practice Providers (APPs- Physician Assistants and Nurse Practitioners) who all work together to provide you with the care you need, when you need it.   You may see any of the following providers on your designated Care Team at your next follow up: Dr Toribio Fuel Dr Ezra Shuck Dr. Ria Commander Dr. Morene Brownie Amy Lenetta, NP Caffie Shed, [place] [facility] [place], [place] Beckey Coe, NP [place] Lee, NP Ellouise Class, NP Tinnie Redman, PharmD Jaun Bash, PharmD   Please be sure to bring in all your medications bottles to every appointment.    Thank you for choosing [facility]

## 2024-05-04 ENCOUNTER — Ambulatory Visit (HOSPITAL_COMMUNITY): Payer: Self-pay | Admitting: Family Medicine

## 2024-05-10 NOTE — Progress Notes (Signed)
 Remote ICD Transmission

## 2024-05-17 ENCOUNTER — Encounter: Payer: Self-pay | Admitting: Family Medicine

## 2024-05-28 ENCOUNTER — Ambulatory Visit (HOSPITAL_COMMUNITY)
Admission: RE | Admit: 2024-05-28 | Discharge: 2024-05-28 | Disposition: A | Discharge status: Home / Self Care | Source: Ambulatory Visit | Attending: Family Medicine | Admitting: Family Medicine

## 2024-05-28 DIAGNOSIS — I5022 Chronic systolic (congestive) heart failure: Secondary | ICD-10-CM | POA: Diagnosis not present

## 2024-05-28 LAB — ECHOCARDIOGRAM COMPLETE
Area-P 1/2: 2.16 cm2
S' Lateral: 4.4 cm

## 2024-05-28 MED ORDER — PERFLUTREN LIPID MICROSPHERE
1.0000 mL | INTRAVENOUS | Status: AC | PRN
  Administered 2024-05-28: 2 mL via INTRAVENOUS

## 2024-06-14 ENCOUNTER — Ambulatory Visit: Admitting: Adult Health

## 2024-06-16 ENCOUNTER — Ambulatory Visit: Payer: Medicaid Other

## 2024-06-16 DIAGNOSIS — I5022 Chronic systolic (congestive) heart failure: Secondary | ICD-10-CM

## 2024-06-17 LAB — CUP PACEART REMOTE DEVICE CHECK
Battery Remaining Longevity: 156 mo
Battery Remaining Percentage: 100 %
Brady Statistic RV Percent Paced: 0 %
Date Time Interrogation Session: 20251105001100
HighPow Impedance: 88 Ohm
Implantable Lead Connection Status: 753985
Implantable Lead Implant Date: 20230509
Implantable Lead Location: 753860
Implantable Lead Model: 137
Implantable Lead Serial Number: 301159
Implantable Pulse Generator Implant Date: 20230509
Lead Channel Impedance Value: 703 Ohm
Lead Channel Pacing Threshold Amplitude: 0.8 V
Lead Channel Pacing Threshold Pulse Width: 0.4 ms
Lead Channel Setting Pacing Amplitude: 2.5 V
Lead Channel Setting Pacing Pulse Width: 0.4 ms
Lead Channel Setting Sensing Sensitivity: 0.5 mV
Pulse Gen Serial Number: 216786
Zone Setting Status: 755011

## 2024-06-18 ENCOUNTER — Ambulatory Visit: Payer: Self-pay | Admitting: Internal Medicine

## 2024-06-18 NOTE — Progress Notes (Signed)
 Remote ICD Transmission

## 2024-06-23 ENCOUNTER — Other Ambulatory Visit (HOSPITAL_COMMUNITY): Payer: Self-pay | Admitting: Internal Medicine

## 2024-07-06 ENCOUNTER — Telehealth (HOSPITAL_COMMUNITY): Payer: Self-pay

## 2024-07-06 ENCOUNTER — Other Ambulatory Visit (HOSPITAL_COMMUNITY): Payer: Self-pay

## 2024-07-06 NOTE — Telephone Encounter (Signed)
 Advanced Heart Failure Patient Advocate Encounter  Prior authorization for Farxiga  has been submitted and approved. Test billing returns $4 for 90 day supply.  Key: [id number] Effective: 07/06/2024 to 07/06/2025  Rachel DEL, CPhT Rx Patient Advocate Phone: [phone]

## 2024-09-10 ENCOUNTER — Ambulatory Visit: Admitting: Adult Health

## 2024-09-15 ENCOUNTER — Ambulatory Visit

## 2024-09-16 LAB — CUP PACEART REMOTE DEVICE CHECK
Battery Remaining Longevity: 150 mo
Battery Remaining Percentage: 100 %
Brady Statistic RV Percent Paced: 0 %
Date Time Interrogation Session: 20260204001200
HighPow Impedance: 100 Ohm
Implantable Lead Connection Status: 753985
Implantable Lead Implant Date: 20230509
Implantable Lead Location: 753860
Implantable Lead Model: 137
Implantable Lead Serial Number: 301159
Implantable Pulse Generator Implant Date: 20230509
Lead Channel Impedance Value: 744 Ohm
Lead Channel Pacing Threshold Amplitude: 0.7 V
Lead Channel Pacing Threshold Pulse Width: 0.4 ms
Lead Channel Setting Pacing Amplitude: 2.5 V
Lead Channel Setting Pacing Pulse Width: 0.4 ms
Lead Channel Setting Sensing Sensitivity: 0.5 mV
Pulse Gen Serial Number: 216786
Zone Setting Status: 755011

## 2024-11-25 ENCOUNTER — Ambulatory Visit (HOSPITAL_COMMUNITY): Admitting: Internal Medicine

## 2024-12-15 ENCOUNTER — Ambulatory Visit

## 2025-03-16 ENCOUNTER — Ambulatory Visit

## 2025-06-15 ENCOUNTER — Ambulatory Visit

## 2025-09-14 ENCOUNTER — Ambulatory Visit

## 2025-12-14 ENCOUNTER — Ambulatory Visit
